# Patient Record
Sex: Male | Born: 1940 | Race: White | Hispanic: No | Smoking: Former smoker
Health system: Southern US, Community
[De-identification: ages and names within clinical notes are randomized; demographics above are authoritative.]

## PROBLEM LIST (undated history)

## (undated) DIAGNOSIS — E559 Vitamin D deficiency, unspecified: Secondary | ICD-10-CM

## (undated) DIAGNOSIS — I10 Essential (primary) hypertension: Secondary | ICD-10-CM

## (undated) DIAGNOSIS — M1990 Unspecified osteoarthritis, unspecified site: Secondary | ICD-10-CM

## (undated) DIAGNOSIS — N281 Cyst of kidney, acquired: Secondary | ICD-10-CM

## (undated) DIAGNOSIS — E785 Hyperlipidemia, unspecified: Secondary | ICD-10-CM

## (undated) DIAGNOSIS — I739 Peripheral vascular disease, unspecified: Secondary | ICD-10-CM

## (undated) HISTORY — DX: Cyst of kidney, acquired: N28.1

## (undated) HISTORY — PX: TONSILLECTOMY: SUR1361

## (undated) HISTORY — DX: Peripheral vascular disease, unspecified: I73.9

## (undated) HISTORY — DX: Unspecified osteoarthritis, unspecified site: M19.90

## (undated) HISTORY — DX: Hyperlipidemia, unspecified: E78.5

## (undated) HISTORY — DX: Vitamin D deficiency, unspecified: E55.9

## (undated) HISTORY — DX: Essential (primary) hypertension: I10

## (undated) HISTORY — PX: HERNIA REPAIR: SHX51

---

## 2005-03-06 ENCOUNTER — Emergency Department: Payer: Self-pay | Admitting: Emergency Medicine

## 2009-06-24 ENCOUNTER — Ambulatory Visit: Payer: Self-pay | Admitting: Cardiovascular Disease

## 2009-06-24 ENCOUNTER — Ambulatory Visit: Payer: Self-pay | Admitting: Surgery

## 2009-07-05 ENCOUNTER — Ambulatory Visit: Payer: Self-pay | Admitting: Surgery

## 2009-11-13 HISTORY — PX: OTHER SURGICAL HISTORY: SHX169

## 2010-06-20 ENCOUNTER — Inpatient Hospital Stay: Payer: Self-pay | Admitting: Surgery

## 2013-09-27 ENCOUNTER — Emergency Department: Payer: Self-pay | Admitting: Emergency Medicine

## 2013-09-27 LAB — URINALYSIS, COMPLETE
Bilirubin,UR: NEGATIVE
Blood: NEGATIVE
Leukocyte Esterase: NEGATIVE
Nitrite: NEGATIVE
Ph: 5 (ref 4.5–8.0)
Protein: 30
Specific Gravity: 1.021 (ref 1.003–1.030)
WBC UR: 1 /HPF (ref 0–5)

## 2013-09-27 LAB — CBC
HCT: 41.4 % (ref 40.0–52.0)
HGB: 14.5 g/dL (ref 13.0–18.0)
MCH: 29.8 pg (ref 26.0–34.0)
MCV: 85 fL (ref 80–100)
Platelet: 199 10*3/uL (ref 150–440)

## 2013-09-27 LAB — TROPONIN I: Troponin-I: <0.02 ng/mL

## 2013-09-27 LAB — COMPREHENSIVE METABOLIC PANEL
Alkaline Phosphatase: 64 U/L (ref 50–136)
Anion Gap: 8 (ref 7–16)
BUN: 30 mg/dL — ABNORMAL HIGH (ref 7–18)
Bilirubin,Total: 0.6 mg/dL (ref 0.2–1.0)
Calcium, Total: 10.6 mg/dL — ABNORMAL HIGH (ref 8.5–10.1)
Co2: 30 mmol/L (ref 21–32)
Creatinine: 1.43 mg/dL — ABNORMAL HIGH (ref 0.60–1.30)
EGFR (Non-African Amer.): 49 — ABNORMAL LOW
Glucose: 140 mg/dL — ABNORMAL HIGH (ref 65–99)
Osmolality: 279 (ref 275–301)
Potassium: 3.5 mmol/L (ref 3.5–5.1)
SGOT(AST): 40 U/L — ABNORMAL HIGH (ref 15–37)
SGPT (ALT): 69 U/L (ref 12–78)
Total Protein: 7.8 g/dL (ref 6.4–8.2)

## 2013-09-27 LAB — CK TOTAL AND CKMB (NOT AT ARMC): CK-MB: 7.2 ng/mL — ABNORMAL HIGH (ref 0.5–3.6)

## 2013-09-27 LAB — LIPASE, BLOOD: Lipase: 127 U/L (ref 73–393)

## 2013-12-15 DIAGNOSIS — N281 Cyst of kidney, acquired: Secondary | ICD-10-CM | POA: Insufficient documentation

## 2014-08-01 ENCOUNTER — Inpatient Hospital Stay: Payer: Self-pay | Admitting: Surgery

## 2014-08-01 LAB — URINALYSIS, COMPLETE
BILIRUBIN, UR: NEGATIVE
Bacteria: NONE SEEN
Blood: NEGATIVE
Glucose,UR: NEGATIVE mg/dL (ref 0–75)
KETONE: NEGATIVE
LEUKOCYTE ESTERASE: NEGATIVE
Nitrite: NEGATIVE
PH: 7 (ref 4.5–8.0)
RBC,UR: 3 /HPF (ref 0–5)
Specific Gravity: 1.017 (ref 1.003–1.030)
Squamous Epithelial: NONE SEEN
WBC UR: NONE SEEN /HPF (ref 0–5)

## 2014-08-01 LAB — COMPREHENSIVE METABOLIC PANEL
ALK PHOS: 47 U/L
AST: 49 U/L — AB (ref 15–37)
Albumin: 4.7 g/dL (ref 3.4–5.0)
Anion Gap: 9 (ref 7–16)
BILIRUBIN TOTAL: 0.4 mg/dL (ref 0.2–1.0)
BUN: 19 mg/dL — AB (ref 7–18)
Calcium, Total: 9.9 mg/dL (ref 8.5–10.1)
Chloride: 105 mmol/L (ref 98–107)
Co2: 26 mmol/L (ref 21–32)
Creatinine: 0.98 mg/dL (ref 0.60–1.30)
Glucose: 136 mg/dL — ABNORMAL HIGH (ref 65–99)
OSMOLALITY: 284 (ref 275–301)
POTASSIUM: 3.7 mmol/L (ref 3.5–5.1)
SGPT (ALT): 56 U/L
SODIUM: 140 mmol/L (ref 136–145)
TOTAL PROTEIN: 8.3 g/dL — AB (ref 6.4–8.2)

## 2014-08-01 LAB — CBC WITH DIFFERENTIAL/PLATELET
Basophil #: 0.1 10*3/uL (ref 0.0–0.1)
Basophil %: 0.9 %
Eosinophil #: 0.1 10*3/uL (ref 0.0–0.7)
Eosinophil %: 0.7 %
HCT: 44.7 % (ref 40.0–52.0)
HGB: 14.6 g/dL (ref 13.0–18.0)
LYMPHS ABS: 1.4 10*3/uL (ref 1.0–3.6)
Lymphocyte %: 13.9 %
MCH: 29.4 pg (ref 26.0–34.0)
MCHC: 32.7 g/dL (ref 32.0–36.0)
MCV: 90 fL (ref 80–100)
Monocyte #: 0.4 x10 3/mm (ref 0.2–1.0)
Monocyte %: 4.1 %
NEUTROS ABS: 8.1 10*3/uL — AB (ref 1.4–6.5)
Neutrophil %: 80.4 %
PLATELETS: 228 10*3/uL (ref 150–440)
RBC: 4.96 10*6/uL (ref 4.40–5.90)
RDW: 14.1 % (ref 11.5–14.5)
WBC: 10.1 10*3/uL (ref 3.8–10.6)

## 2014-08-01 LAB — TROPONIN I: Troponin-I: <0.02 ng/mL

## 2014-08-01 LAB — LIPASE, BLOOD: LIPASE: 822 U/L — AB (ref 73–393)

## 2014-08-02 LAB — CBC WITH DIFFERENTIAL/PLATELET
BASOS PCT: 0.3 %
Basophil #: 0 10*3/uL (ref 0.0–0.1)
EOS ABS: 0.1 10*3/uL (ref 0.0–0.7)
EOS PCT: 1.1 %
HCT: 39.8 % — AB (ref 40.0–52.0)
HGB: 13.7 g/dL (ref 13.0–18.0)
LYMPHS ABS: 1.8 10*3/uL (ref 1.0–3.6)
LYMPHS PCT: 18.3 %
MCH: 30.5 pg (ref 26.0–34.0)
MCHC: 34.3 g/dL (ref 32.0–36.0)
MCV: 89 fL (ref 80–100)
MONO ABS: 0.8 x10 3/mm (ref 0.2–1.0)
MONOS PCT: 7.7 %
NEUTROS ABS: 7.2 10*3/uL — AB (ref 1.4–6.5)
NEUTROS PCT: 72.6 %
Platelet: 206 10*3/uL (ref 150–440)
RBC: 4.47 10*6/uL (ref 4.40–5.90)
RDW: 14.2 % (ref 11.5–14.5)
WBC: 9.9 10*3/uL (ref 3.8–10.6)

## 2014-08-02 LAB — COMPREHENSIVE METABOLIC PANEL
ALBUMIN: 3.7 g/dL (ref 3.4–5.0)
ANION GAP: 7 (ref 7–16)
AST: 23 U/L (ref 15–37)
Alkaline Phosphatase: 40 U/L — ABNORMAL LOW
BILIRUBIN TOTAL: 0.4 mg/dL (ref 0.2–1.0)
BUN: 17 mg/dL (ref 7–18)
CALCIUM: 8.9 mg/dL (ref 8.5–10.1)
Chloride: 104 mmol/L (ref 98–107)
Co2: 29 mmol/L (ref 21–32)
Creatinine: 0.95 mg/dL (ref 0.60–1.30)
Glucose: 92 mg/dL (ref 65–99)
OSMOLALITY: 281 (ref 275–301)
POTASSIUM: 3.5 mmol/L (ref 3.5–5.1)
SGPT (ALT): 42 U/L
SODIUM: 140 mmol/L (ref 136–145)
Total Protein: 6.9 g/dL (ref 6.4–8.2)

## 2014-08-02 LAB — LIPASE, BLOOD: LIPASE: 110 U/L (ref 73–393)

## 2015-03-06 NOTE — H&P (Signed)
   Subjective/Chief Complaint Diffuse abdominal pain, nausea   History of Present Illness Frank Hamilton is a pleasant 74 yo m with a history of recurrent sbo, initially due to incarcerated/strangulated femoral hernia years ago who presents with approx 8 hours of diffuse abdominal pain.  Initially felt like he had some dyspepsia yesterday evening which resolved with peptobismol.  Felt strange this morning but tolerated breakfast and then had dyspepsia which did not resolve with pepto or gasx.  Developed severe recurrent abdominal pain at 9 am.  Better now.  CT shows mildly dilated proximal bowel loops.  Last bm x 2 this am which were small.   Past History H/o ex lap for sbo 11/14 H/o sbr for sbo in [place] secondary to femoral hernia H/o recurrent left femoral hernia repair 2010 H/o SBO which resolved spontaneously 2011 HTN H/o tonsillectomy H/o adenoidectomy   Past Medical Health Hypertension   Code Status Full Code   Past Med/Surgical Hx:  Small bowel obstruction: Surgery done at [facility]  HTN:   Hernia Repair:   Tonsillectomy:   ALLERGIES:  Sulfa drugs: Itching, Rash  Percocet: GI Distress  Vicodin: GI Distress  Family and Social History:  Family History Non-Contributory   Social History negative tobacco, negative ETOH   Place of Living Home   Review of Systems:  Subjective/Chief Complaint Diffuse abdominal pain, nausea   Fever/Chills No   Cough No   Sputum No   Abdominal Pain Yes   Diarrhea No   Constipation Yes   Nausea/Vomiting Yes   SOB/DOE No   Chest Pain No   Dysuria No   Tolerating Diet Yes  Nauseated   Physical Exam:  GEN well developed, well nourished, no acute distress   HEENT pink conjunctivae, PERRL, hearing intact to voice, good dentition   RESP normal resp effort  clear BS  no use of accessory muscles   CARD regular rate  no murmur  no thrills  no carotid bruits   ABD denies tenderness  no hernia  soft  distended  normal BS   Mild distention   EXTR negative cyanosis/clubbing, negative edema   SKIN normal to palpation, No rashes, No ulcers   NEURO cranial nerves intact, negative rigidity, negative tremor, follows commands, strength:, motor/sensory function intact   PSYCH A+O to time, place, person, good insight    Assessment/Admission Diagnosis 74 yo with history of recurrent sbo, now admit with dilated loops of proximal bowel, collapsed distal, abdominal pain.  Also with lipase 822 of unknown etiology.   Plan Admit, NG tube, hydrate.  KUB in am.  F/u am labs and lipase.  Treat as sbo.  no need for acute surgical intervention at this time.   Electronic Signatures: Floyde Parkins (MD)  (Signed 19-Sep-15 16:53)  Authored: CHIEF COMPLAINT and HISTORY, PAST MEDICAL/SURGIAL HISTORY, ALLERGIES, FAMILY AND SOCIAL HISTORY, REVIEW OF SYSTEMS, PHYSICAL EXAM, ASSESSMENT AND PLAN   Last Updated: 19-Sep-15 16:53 by Floyde Parkins (MD)

## 2015-08-03 ENCOUNTER — Other Ambulatory Visit: Payer: Self-pay | Admitting: Family Medicine

## 2015-08-03 ENCOUNTER — Encounter: Payer: Self-pay | Admitting: Family Medicine

## 2015-08-03 ENCOUNTER — Ambulatory Visit (INDEPENDENT_AMBULATORY_CARE_PROVIDER_SITE_OTHER): Payer: Medicare Other | Admitting: Family Medicine

## 2015-08-03 ENCOUNTER — Ambulatory Visit: Payer: Self-pay | Admitting: Family Medicine

## 2015-08-03 VITALS — BP 128/72 | HR 55 | Temp 98.1°F | Resp 16 | Ht 71.0 in | Wt 200.4 lb

## 2015-08-03 DIAGNOSIS — Z23 Encounter for immunization: Secondary | ICD-10-CM | POA: Diagnosis not present

## 2015-08-03 DIAGNOSIS — E785 Hyperlipidemia, unspecified: Secondary | ICD-10-CM | POA: Diagnosis not present

## 2015-08-03 DIAGNOSIS — I10 Essential (primary) hypertension: Secondary | ICD-10-CM | POA: Diagnosis not present

## 2015-08-03 MED ORDER — AMLODIPINE BESYLATE 10 MG PO TABS: 10.0000 mg | ORAL_TABLET | Freq: Every day | ORAL | Status: DC

## 2015-08-03 MED ORDER — BENAZEPRIL HCL 20 MG PO TABS: 20.0000 mg | ORAL_TABLET | Freq: Every day | ORAL | Status: DC

## 2015-08-03 MED ORDER — FENOFIBRATE MICRONIZED 134 MG PO CAPS: 134.0000 mg | ORAL_CAPSULE | Freq: Every day | ORAL | Status: DC

## 2015-08-03 NOTE — Progress Notes (Signed)
Name: Frank Hamilton   MRN: [id number]    DOB: September 03, 1941   Date:08/03/2015       Progress Note  Subjective  Chief Complaint  Chief Complaint  Patient presents with  . Hyperlipidemia    py here for 4 month follow up  . Hypertension    Hyperlipidemia This is a chronic problem. The problem is controlled. Pertinent negatives include no chest pain, leg pain, myalgias or shortness of breath. Current antihyperlipidemic treatment includes fibric acid derivatives.  Hypertension This is a chronic problem. The problem is controlled. Pertinent negatives include no chest pain, headaches, palpitations or shortness of breath. Past treatments include ACE inhibitors and calcium channel blockers. There is no history of kidney disease, CAD/MI or CVA.    Past Medical History  Diagnosis Date  . Hyperlipidemia   . Hypertension     History reviewed. No pertinent past surgical history.  Family History  Problem Relation Age of Onset  . Diabetes Maternal Grandfather     Social History   Social History  . Marital Status: Married    Spouse Name: N/A  . Number of Children: N/A  . Years of Education: N/A   Occupational History  . Not on file.   Social History Main Topics  . Smoking status: Former Research scientist (life sciences)  . Smokeless tobacco: Not on file  . Alcohol Use: 0.0 oz/week    0 Standard drinks or equivalent per week  . Drug Use: No  . Sexual Activity: Not on file   Other Topics Concern  . Not on file   Social History Narrative  . No narrative on file     Current outpatient prescriptions:  .  amLODipine (NORVASC) 10 MG tablet, Take 1 tablet (10 mg total) by mouth daily., Disp: 90 tablet, Rfl: 1 .  aspirin 81 MG chewable tablet, Chew 81 mg by mouth., Disp: , Rfl:  .  benazepril (LOTENSIN) 20 MG tablet, Take 1 tablet (20 mg total) by mouth daily., Disp: 90 tablet, Rfl: 1 .  fenofibrate micronized (LOFIBRA) 134 MG capsule, Take 1 capsule (134 mg total) by mouth daily., Disp: 90 capsule, Rfl:  1 .  latanoprost (XALATAN) 0.005 % ophthalmic solution, USE 1 DROP INTO BOTH EYES AT BEDTIME AS DIRECTED, Disp: , Rfl: 4 .  Omega-3 1000 MG CAPS, Take by mouth., Disp: , Rfl:   Allergies  Allergen Reactions  . Sulfa Antibiotics     Review of Systems  Respiratory: Negative for shortness of breath.   Cardiovascular: Negative for chest pain and palpitations.  Musculoskeletal: Negative for myalgias.  Neurological: Negative for headaches.    Objective  Filed Vitals:   08/03/15 1022  BP: 128/72  Pulse: 55  Temp: 98.1 F (36.7 C)  Resp: 16  Height: 5\' 11"  (1.803 m)  Weight: 200 lb 7 oz (90.918 kg)  SpO2: 95%    Physical Exam  Constitutional: He is well-developed, well-nourished, and in no distress.  Cardiovascular: Normal rate and regular rhythm.   Pulmonary/Chest: Effort normal and breath sounds normal.  Abdominal: Soft. Bowel sounds are normal.  Nursing note and vitals reviewed.  Assessment & Plan  1. Need for influenza vaccination  - Flu Vaccine QUAD 36+ mos PF IM (Fluarix & Fluzone Quad PF)  2. Essential hypertension  Blood pressure stable on present antihypertensive therapy.  3. Hyperlipidemia  Elevated total cholesterol, and LDL cholesterol by history. Patient does not wish to take statins. Continue on fenofibrate for now and recheck lipids today.  - Lipid Profile -  Comprehensive Metabolic Panel (CMET)    Syed Asad A. [facility] 08/03/2015 11:07 AM

## 2015-08-05 LAB — LIPID PANEL
CHOLESTEROL TOTAL: 197 mg/dL (ref 100–199)
Chol/HDL Ratio: 6.8 ratio units — ABNORMAL HIGH (ref 0.0–5.0)
HDL: 29 mg/dL — ABNORMAL LOW (ref >39)
LDL CALC: 135 mg/dL — AB (ref 0–99)
TRIGLYCERIDES: 167 mg/dL — AB (ref 0–149)
VLDL CHOLESTEROL CAL: 33 mg/dL (ref 5–40)

## 2015-08-05 LAB — COMPREHENSIVE METABOLIC PANEL
A/G RATIO: 2.1 (ref 1.1–2.5)
ALT: 40 IU/L (ref 0–44)
AST: 31 IU/L (ref 0–40)
Albumin: 4.6 g/dL (ref 3.5–4.8)
Alkaline Phosphatase: 42 IU/L (ref 39–117)
BILIRUBIN TOTAL: 0.4 mg/dL (ref 0.0–1.2)
BUN / CREAT RATIO: 21 (ref 10–22)
BUN: 17 mg/dL (ref 8–27)
CHLORIDE: 103 mmol/L (ref 97–108)
CO2: 24 mmol/L (ref 18–29)
Calcium: 9.5 mg/dL (ref 8.6–10.2)
Creatinine, Ser: 0.8 mg/dL (ref 0.76–1.27)
GFR calc non Af Amer: 89 mL/min/{1.73_m2} (ref >59)
GFR, EST AFRICAN AMERICAN: 103 mL/min/{1.73_m2} (ref >59)
Globulin, Total: 2.2 g/dL (ref 1.5–4.5)
Glucose: 94 mg/dL (ref 65–99)
POTASSIUM: 4.8 mmol/L (ref 3.5–5.2)
Sodium: 142 mmol/L (ref 134–144)
TOTAL PROTEIN: 6.8 g/dL (ref 6.0–8.5)

## 2015-08-24 ENCOUNTER — Telehealth: Payer: Self-pay | Admitting: Family Medicine

## 2015-08-24 NOTE — Telephone Encounter (Signed)
Routed note to front desk staff to schedule appointment

## 2015-08-25 NOTE — Telephone Encounter (Signed)
Appointment made for 09-07-15 with Dr Rutherford Nail

## 2015-09-07 ENCOUNTER — Encounter: Payer: Self-pay | Admitting: Family Medicine

## 2015-09-07 ENCOUNTER — Ambulatory Visit (INDEPENDENT_AMBULATORY_CARE_PROVIDER_SITE_OTHER): Payer: Medicare Other | Admitting: Family Medicine

## 2015-09-07 VITALS — BP 138/64 | HR 60 | Temp 98.5°F | Resp 16 | Ht 71.0 in | Wt 201.2 lb

## 2015-09-07 DIAGNOSIS — E786 Lipoprotein deficiency: Secondary | ICD-10-CM

## 2015-09-07 NOTE — Progress Notes (Signed)
Zoloft Name: Frank Hamilton   MRN: [id number]    DOB: [birth year]   Date:09/07/2015       Progress Note  Subjective  Chief Complaint  Chief Complaint  Patient presents with  . Hyperlipidemia    pt here to discuss therapy    HPI  Hyperlipidemia  Patient has a history of hyperlipidemia for over 5 years.  Current medical regimen consist of visual OTC fenofibrate .  Compliance is good .  Diet and exercise are currently followed fairly well .  Risk factors for cardiovascular disease include hyperlipidemia and hypertension and his age. He refuses to take statins   There have been no side effects from the medication.    Past Medical History  Diagnosis Date  . Hyperlipidemia   . Hypertension     Social History  Substance Use Topics  . Smoking status: Former Research scientist (life sciences)  . Smokeless tobacco: Not on file  . Alcohol Use: 0.0 oz/week    0 Standard drinks or equivalent per week     Current outpatient prescriptions:  .  amLODipine (NORVASC) 10 MG tablet, TAKE 1 TABLET BY MOUTH EVERY DAY, Disp: 90 tablet, Rfl: 1 .  amLODipine (NORVASC) 10 MG tablet, Take 1 tablet (10 mg total) by mouth daily., Disp: 90 tablet, Rfl: 1 .  aspirin 81 MG chewable tablet, Chew 81 mg by mouth., Disp: , Rfl:  .  benazepril (LOTENSIN) 20 MG tablet, TAKE ONE TABLET BY MOUTH ONCE DAILY, Disp: 90 tablet, Rfl: 1 .  benazepril (LOTENSIN) 20 MG tablet, Take 1 tablet (20 mg total) by mouth daily., Disp: 90 tablet, Rfl: 1 .  fenofibrate micronized (LOFIBRA) 134 MG capsule, TAKE ONE CAPSULE BY MOUTH EVERY DAY, Disp: 90 capsule, Rfl: 1 .  fenofibrate micronized (LOFIBRA) 134 MG capsule, Take 1 capsule (134 mg total) by mouth daily., Disp: 90 capsule, Rfl: 1 .  latanoprost (XALATAN) 0.005 % ophthalmic solution, USE 1 DROP INTO BOTH EYES AT BEDTIME AS DIRECTED, Disp: , Rfl: 4 .  Omega-3 1000 MG CAPS, Take by mouth., Disp: , Rfl:   Allergies  Allergen Reactions  . Sulfa Antibiotics     Review of Systems  Constitutional:  Negative for fever, chills and weight loss.  HENT: Negative for congestion, hearing loss, sore throat and tinnitus.   Eyes: Negative for blurred vision, double vision and redness.  Respiratory: Negative for cough, hemoptysis and shortness of breath.   Cardiovascular: Negative for chest pain, palpitations, orthopnea, claudication and leg swelling.  Gastrointestinal: Negative for heartburn, nausea, vomiting, diarrhea, constipation and blood in stool.  Genitourinary: Negative for dysuria, urgency, frequency and hematuria.  Musculoskeletal: Negative for myalgias, back pain, joint pain, falls and neck pain.  Skin: Negative for itching.  Neurological: Negative for dizziness, tingling, tremors, focal weakness, seizures, loss of consciousness, weakness and headaches.  Endo/Heme/Allergies: Does not bruise/bleed easily.  Psychiatric/Behavioral: Negative for depression and substance abuse. The patient is not nervous/anxious and does not have insomnia.      Objective  Filed Vitals:   09/07/15 1146  BP: 138/64  Pulse: 60  Temp: 98.5 F (36.9 C)  Resp: 16  Height: 5\' 11"  (1.803 m)  Weight: 201 lb 4 oz (91.286 kg)  SpO2: 95%     Physical Exam  Constitutional: He is well-developed, well-nourished, and in no distress.  HENT:  Head: Normocephalic.  Eyes: Pupils are equal, round, and reactive to light.      Assessment & Plan  1. HDL deficiency Discussed with patient stated difficulty of  increasing HDL. He is already on fish O fenofibrate. Informed him that niacin has not been found to decrease morbidity or mortality help increase HDL. He is encouraged to see dietitian well to avoid sweets and starches as much as possible and to increase his aerobic exercise. Reassessments      Skin

## 2015-11-14 HISTORY — PX: CATARACT EXTRACTION, BILATERAL: SHX1313

## 2015-11-16 ENCOUNTER — Telehealth: Payer: Self-pay | Admitting: Family Medicine

## 2015-11-16 NOTE — Telephone Encounter (Signed)
REFILL ON VALIUM . PHARM IS [facility] [address].

## 2015-11-16 NOTE — Telephone Encounter (Signed)
Left vm for pt to return my call to discuss this further.

## 2015-11-16 NOTE — Telephone Encounter (Signed)
I do not see workup and we have prescribed Valium for him

## 2015-11-18 ENCOUNTER — Other Ambulatory Visit: Payer: Self-pay | Admitting: Family Medicine

## 2015-11-18 MED ORDER — LORAZEPAM 0.5 MG PO TABS: 0.5000 mg | ORAL_TABLET | Freq: Two times a day (BID) | ORAL | Status: DC | PRN

## 2015-11-19 ENCOUNTER — Other Ambulatory Visit: Payer: Self-pay

## 2015-11-19 MED ORDER — CITALOPRAM HYDROBROMIDE 10 MG PO TABS: 10.0000 mg | ORAL_TABLET | Freq: Every day | ORAL | Status: DC

## 2016-01-10 DIAGNOSIS — H2511 Age-related nuclear cataract, right eye: Secondary | ICD-10-CM | POA: Diagnosis not present

## 2016-01-10 DIAGNOSIS — Z961 Presence of intraocular lens: Secondary | ICD-10-CM | POA: Diagnosis not present

## 2016-01-10 DIAGNOSIS — H25811 Combined forms of age-related cataract, right eye: Secondary | ICD-10-CM | POA: Diagnosis not present

## 2016-02-01 ENCOUNTER — Ambulatory Visit: Payer: Medicare Other | Admitting: Family Medicine

## 2016-02-25 ENCOUNTER — Other Ambulatory Visit: Payer: Self-pay | Admitting: Family Medicine

## 2016-02-29 ENCOUNTER — Ambulatory Visit: Payer: Medicare Other | Admitting: Family Medicine

## 2016-03-06 DIAGNOSIS — H2512 Age-related nuclear cataract, left eye: Secondary | ICD-10-CM | POA: Diagnosis not present

## 2016-03-06 DIAGNOSIS — H401222 Low-tension glaucoma, left eye, moderate stage: Secondary | ICD-10-CM | POA: Diagnosis not present

## 2016-03-06 DIAGNOSIS — H25812 Combined forms of age-related cataract, left eye: Secondary | ICD-10-CM | POA: Diagnosis not present

## 2016-03-06 DIAGNOSIS — H40002 Preglaucoma, unspecified, left eye: Secondary | ICD-10-CM | POA: Diagnosis not present

## 2016-03-15 ENCOUNTER — Telehealth: Payer: Self-pay | Admitting: Family Medicine

## 2016-03-15 NOTE — Telephone Encounter (Signed)
Requesting refill on Fenofibrate, Amlodipine, and Benazepril. Please send to [facility]

## 2016-03-16 MED ORDER — BENAZEPRIL HCL 20 MG PO TABS: 20.0000 mg | ORAL_TABLET | Freq: Every day | ORAL | Status: DC

## 2016-03-16 MED ORDER — AMLODIPINE BESYLATE 10 MG PO TABS: 10.0000 mg | ORAL_TABLET | Freq: Every day | ORAL | Status: DC

## 2016-03-16 MED ORDER — FENOFIBRATE MICRONIZED 134 MG PO CAPS: 134.0000 mg | ORAL_CAPSULE | Freq: Every day | ORAL | Status: DC

## 2016-03-16 NOTE — Telephone Encounter (Signed)
Left voice message informing patient prescriptions are at the pharmacy

## 2016-03-16 NOTE — Telephone Encounter (Signed)
Refills have been sent to pt pharmacy. 

## 2016-03-20 DIAGNOSIS — Z961 Presence of intraocular lens: Secondary | ICD-10-CM | POA: Diagnosis not present

## 2016-04-25 DIAGNOSIS — H40003 Preglaucoma, unspecified, bilateral: Secondary | ICD-10-CM | POA: Diagnosis not present

## 2016-05-01 ENCOUNTER — Encounter: Payer: Self-pay | Admitting: Family Medicine

## 2016-05-01 ENCOUNTER — Ambulatory Visit (INDEPENDENT_AMBULATORY_CARE_PROVIDER_SITE_OTHER): Payer: Medicare Other | Admitting: Family Medicine

## 2016-05-01 VITALS — BP 124/64 | HR 61 | Temp 98.9°F | Resp 18 | Ht 71.0 in | Wt 200.5 lb

## 2016-05-01 DIAGNOSIS — I10 Essential (primary) hypertension: Secondary | ICD-10-CM | POA: Diagnosis not present

## 2016-05-01 DIAGNOSIS — M19041 Primary osteoarthritis, right hand: Secondary | ICD-10-CM | POA: Insufficient documentation

## 2016-05-01 DIAGNOSIS — E559 Vitamin D deficiency, unspecified: Secondary | ICD-10-CM | POA: Diagnosis not present

## 2016-05-01 DIAGNOSIS — M150 Primary generalized (osteo)arthritis: Secondary | ICD-10-CM | POA: Diagnosis not present

## 2016-05-01 DIAGNOSIS — M8949 Other hypertrophic osteoarthropathy, multiple sites: Secondary | ICD-10-CM

## 2016-05-01 DIAGNOSIS — M79609 Pain in unspecified limb: Secondary | ICD-10-CM | POA: Diagnosis not present

## 2016-05-01 DIAGNOSIS — E785 Hyperlipidemia, unspecified: Secondary | ICD-10-CM

## 2016-05-01 DIAGNOSIS — M19042 Primary osteoarthritis, left hand: Secondary | ICD-10-CM

## 2016-05-01 DIAGNOSIS — M159 Polyosteoarthritis, unspecified: Secondary | ICD-10-CM

## 2016-05-01 DIAGNOSIS — M79605 Pain in left leg: Secondary | ICD-10-CM | POA: Diagnosis not present

## 2016-05-01 DIAGNOSIS — M79604 Pain in right leg: Secondary | ICD-10-CM

## 2016-05-01 LAB — COMPREHENSIVE METABOLIC PANEL
ALBUMIN: 4.4 g/dL (ref 3.6–5.1)
ALK PHOS: 45 U/L (ref 40–115)
ALT: 45 U/L (ref 9–46)
AST: 37 U/L — ABNORMAL HIGH (ref 10–35)
BUN: 12 mg/dL (ref 7–25)
CHLORIDE: 103 mmol/L (ref 98–110)
CO2: 28 mmol/L (ref 20–31)
CREATININE: 0.83 mg/dL (ref 0.70–1.18)
Calcium: 9.3 mg/dL (ref 8.6–10.3)
Glucose, Bld: 86 mg/dL (ref 65–99)
POTASSIUM: 4.7 mmol/L (ref 3.5–5.3)
SODIUM: 141 mmol/L (ref 135–146)
TOTAL PROTEIN: 6.6 g/dL (ref 6.1–8.1)
Total Bilirubin: 0.5 mg/dL (ref 0.2–1.2)

## 2016-05-01 LAB — CBC
HEMATOCRIT: 42.3 % (ref 38.5–50.0)
Hemoglobin: 14.3 g/dL (ref 13.2–17.1)
MCH: 29.9 pg (ref 27.0–33.0)
MCHC: 33.8 g/dL (ref 32.0–36.0)
MCV: 88.5 fL (ref 80.0–100.0)
MPV: 9.6 fL (ref 7.5–12.5)
Platelets: 192 10*3/uL (ref 140–400)
RBC: 4.78 MIL/uL (ref 4.20–5.80)
RDW: 14.6 % (ref 11.0–15.0)
WBC: 6.1 10*3/uL (ref 3.8–10.8)

## 2016-05-01 LAB — TSH: TSH: 3.42 mIU/L (ref 0.40–4.50)

## 2016-05-01 LAB — VITAMIN B12: VITAMIN B 12: 1264 pg/mL — AB (ref 200–1100)

## 2016-05-01 NOTE — Patient Instructions (Signed)
Stop magnesium and fenofibrate.

## 2016-05-02 ENCOUNTER — Encounter: Payer: Self-pay | Admitting: Family Medicine

## 2016-05-02 ENCOUNTER — Other Ambulatory Visit: Payer: Self-pay | Admitting: Family Medicine

## 2016-05-02 DIAGNOSIS — E559 Vitamin D deficiency, unspecified: Secondary | ICD-10-CM | POA: Insufficient documentation

## 2016-05-02 DIAGNOSIS — E538 Deficiency of other specified B group vitamins: Secondary | ICD-10-CM | POA: Insufficient documentation

## 2016-05-02 LAB — VITAMIN D 25 HYDROXY (VIT D DEFICIENCY, FRACTURES): VIT D 25 HYDROXY: 27 ng/mL — AB (ref 30–100)

## 2016-05-02 MED ORDER — VITAMIN D 50 MCG (2000 UT) PO CAPS: 1.0000 | ORAL_CAPSULE | Freq: Every day | ORAL | Status: AC

## 2016-05-02 NOTE — Progress Notes (Signed)
Date:  05/01/2016   Name:  Everlene FarrierDavid H Buechele   DOB:  08/10/1941   MRN:  [id number]  PCP:  Dennison MascotLemont Morrisey, MD    Chief Complaint: Hypertension; Hyperlipidemia; and Medication Refill   History of Present Illness:  This is a 75 y.o. male seen for eight month f/u. B12 def on supplement, HLD on Lofibra and omega-3, OA on G/C, HTN on Lotensin and amlodipine, on asa and Mg for prevention. C/o BLE pain/weakness with prolonged ambulation. Declines statin as family member had problems with it in past.  Review of Systems:  Review of Systems  Constitutional: Negative for fever and fatigue.  Respiratory: Negative for cough and shortness of breath.   Cardiovascular: Negative for chest pain and leg swelling.  Endocrine: Negative for polyuria.  Genitourinary: Negative for difficulty urinating.  Neurological: Negative for syncope and light-headedness.    Patient Active Problem List   Diagnosis Date Noted  . Osteoarthritis 05/01/2016  . Hypertension 08/03/2015  . Hyperlipidemia 08/03/2015  . Kidney cysts 12/15/2013    Prior to Admission medications   Medication Sig Start Date End Date Taking? Authorizing Provider  glucosamine-chondroitin 500-400 MG tablet Take 1 tablet by mouth 3 (three) times daily.   Yes Historical Provider, MD  vitamin B-12 (CYANOCOBALAMIN) 100 MCG tablet Take 100 mcg by mouth daily.   Yes Historical Provider, MD  amLODipine (NORVASC) 10 MG tablet Take 1 tablet (10 mg total) by mouth daily. 03/16/16   Dennison MascotLemont Morrisey, MD  aspirin 81 MG chewable tablet Chew 81 mg by mouth.    Historical Provider, MD  benazepril (LOTENSIN) 20 MG tablet Take 1 tablet (20 mg total) by mouth daily. 03/16/16   Dennison MascotLemont Morrisey, MD  Omega-3 1000 MG CAPS Take 1 capsule by mouth 2 (two) times daily.    Historical Provider, MD    Allergies  Allergen Reactions  . Sulfa Antibiotics     Past Surgical History  Procedure Laterality Date  . Hernia repair  2009 and 2010    Social History  Substance Use  Topics  . Smoking status: Former Games developermoker  . Smokeless tobacco: None  . Alcohol Use: 0.0 oz/week    0 Standard drinks or equivalent per week    Family History  Problem Relation Age of Onset  . Diabetes Maternal Grandfather   . Heart disease Mother   . Stroke Father   . Heart disease Sister   . Diabetes Sister     Medication list has been reviewed and updated.  Physical Examination: BP 124/64 mmHg  Pulse 61  Temp(Src) 98.9 F (37.2 C)  Resp 18  Ht 5\' 11"  (1.803 m)  Wt 200 lb 8 oz (90.946 kg)  BMI 27.98 kg/m2  SpO2 95%  Physical Exam  Constitutional: He appears well-developed and well-nourished.  Cardiovascular: Normal rate, regular rhythm and normal heart sounds.   Pedal pulses weak bilaterally, poor hair growth on feet  Pulmonary/Chest: Effort normal and breath sounds normal.  Musculoskeletal: He exhibits no edema.  Neurological: He is alert.  Skin: Skin is warm and dry.  Psychiatric: He has a normal mood and affect. His behavior is normal.  Nursing note and vitals reviewed.   Assessment and Plan:  1. Essential hypertension Well controlled on Lotensin/Norvasc - Comprehensive metabolic panel - CBC  2. Hyperlipidemia Marginal control in September, d/c [place] given no putcome benefit, advised statin but pt declines.  3. Leg pain, bilateral Possible claudication, consider BLE arterial Dopplers if blood work normal - TSH - B12 - Vitamin  D (25 hydroxy)  4. Primary osteoarthritis involving multiple joints Cont G/C  5. Med review D/c Mg as no clear indication for use  Return in about 6 months (around 10/31/2016).  Satira Anis. [facility]  05/02/2016

## 2016-06-07 DIAGNOSIS — H40003 Preglaucoma, unspecified, bilateral: Secondary | ICD-10-CM | POA: Diagnosis not present

## 2016-06-26 ENCOUNTER — Other Ambulatory Visit: Payer: Self-pay | Admitting: Family Medicine

## 2016-07-10 ENCOUNTER — Telehealth: Payer: Self-pay | Admitting: Family Medicine

## 2016-07-10 NOTE — Telephone Encounter (Signed)
Patient has not been seen in over 6 months. Please schedule for a medication refill appointment

## 2016-07-11 NOTE — Telephone Encounter (Signed)
Pt scheduled  

## 2016-07-11 NOTE — Telephone Encounter (Signed)
COMPLETED

## 2016-07-12 ENCOUNTER — Ambulatory Visit
Admission: RE | Admit: 2016-07-12 | Discharge: 2016-07-12 | Disposition: A | Discharge status: Home / Self Care | Payer: Medicare Other | Source: Ambulatory Visit | Attending: Family Medicine | Admitting: Family Medicine

## 2016-07-12 ENCOUNTER — Ambulatory Visit (INDEPENDENT_AMBULATORY_CARE_PROVIDER_SITE_OTHER): Payer: Medicare Other | Admitting: Family Medicine

## 2016-07-12 ENCOUNTER — Encounter: Payer: Self-pay | Admitting: Family Medicine

## 2016-07-12 VITALS — BP 148/72 | HR 60 | Temp 98.2°F | Resp 16 | Ht 71.0 in | Wt 200.0 lb

## 2016-07-12 DIAGNOSIS — M5136 Other intervertebral disc degeneration, lumbar region: Secondary | ICD-10-CM | POA: Insufficient documentation

## 2016-07-12 DIAGNOSIS — M25552 Pain in left hip: Secondary | ICD-10-CM

## 2016-07-12 DIAGNOSIS — I10 Essential (primary) hypertension: Secondary | ICD-10-CM

## 2016-07-12 DIAGNOSIS — M1612 Unilateral primary osteoarthritis, left hip: Secondary | ICD-10-CM | POA: Diagnosis not present

## 2016-07-12 DIAGNOSIS — M160 Bilateral primary osteoarthritis of hip: Secondary | ICD-10-CM | POA: Diagnosis not present

## 2016-07-12 DIAGNOSIS — E785 Hyperlipidemia, unspecified: Secondary | ICD-10-CM | POA: Diagnosis not present

## 2016-07-12 MED ORDER — AMLODIPINE BESYLATE 10 MG PO TABS: 10.0000 mg | ORAL_TABLET | Freq: Every day | ORAL | 0 refills | Status: DC

## 2016-07-12 MED ORDER — BENAZEPRIL HCL 20 MG PO TABS: 20.0000 mg | ORAL_TABLET | Freq: Every day | ORAL | 0 refills | Status: DC

## 2016-07-12 NOTE — Progress Notes (Signed)
Name: Frank Hamilton   MRN: [id number]    DOB: 1941/08/05   Date:07/12/2016       Progress Note  Subjective  Chief Complaint  Chief Complaint  Patient presents with  . Medication Refill    Back Pain Pertinent negatives include no chest pain, headaches, numbness or tingling.  Hip Pain   There was no injury mechanism. The pain is present in the left hip. The pain is at a severity of 7/10. The pain is moderate. Pertinent negatives include no numbness or tingling. Associated symptoms comments: Associated mild ache in the left posterior leg.Marland Kitchen He has tried rest and NSAIDs (Lying down in the recliner makes his pain go away) for the symptoms.  Hypertension  This is a chronic problem. The problem is unchanged. The problem is controlled. Pertinent negatives include no blurred vision, chest pain, headaches, palpitations or shortness of breath. Past treatments include calcium channel blockers and ACE inhibitors. There is no history of kidney disease, CAD/MI or CVA.     Past Medical History:  Diagnosis Date  . Hyperlipidemia   . Hypertension     Past Surgical History:  Procedure Laterality Date  . HERNIA REPAIR  2009 and 2010    Family History  Problem Relation Age of Onset  . Heart disease Mother   . Stroke Father   . Heart disease Sister   . Diabetes Sister   . Diabetes Maternal Grandfather     Social History   Social History  . Marital status: Married    Spouse name: N/A  . Number of children: N/A  . Years of education: N/A   Occupational History  . Not on file.   Social History Main Topics  . Smoking status: Former Research scientist (life sciences)  . Smokeless tobacco: Never Used  . Alcohol use 0.0 oz/week  . Drug use: No  . Sexual activity: Not on file   Other Topics Concern  . Not on file   Social History Narrative  . No narrative on file     Current Outpatient Prescriptions:  .  amLODipine (NORVASC) 10 MG tablet, Take 1 tablet (10 mg total) by mouth daily., Disp: 90 tablet, Rfl:  0 .  aspirin 81 MG chewable tablet, Chew 81 mg by mouth., Disp: , Rfl:  .  benazepril (LOTENSIN) 20 MG tablet, Take 1 tablet (20 mg total) by mouth daily., Disp: 90 tablet, Rfl: 0 .  Cholecalciferol (VITAMIN D) 2000 units CAPS, Take 1 capsule (2,000 Units total) by mouth daily., Disp: 30 capsule, Rfl:  .  glucosamine-chondroitin 500-400 MG tablet, Take 1 tablet by mouth 3 (three) times daily., Disp: , Rfl:  .  Omega-3 1000 MG CAPS, Take 1 capsule by mouth 2 (two) times daily., Disp: , Rfl:   Allergies  Allergen Reactions  . Sulfa Antibiotics      Review of Systems  Eyes: Negative for blurred vision.  Respiratory: Negative for shortness of breath.   Cardiovascular: Negative for chest pain and palpitations.  Musculoskeletal: Positive for joint pain. Negative for back pain.  Neurological: Negative for tingling, numbness and headaches.    Objective  Vitals:   07/12/16 1039  BP: (!) 148/72  Pulse: 60  Resp: 16  Temp: 98.2 F (36.8 C)  TempSrc: Oral  SpO2: 96%  Weight: 200 lb (90.7 kg)  Height: 5\' 11"  (1.803 m)    Physical Exam  Constitutional: He is well-developed, well-nourished, and in no distress.  Cardiovascular: Normal rate, regular rhythm, S1 normal and S2 normal.  Murmur heard.  Systolic murmur is present with a grade of 2/6  Pulmonary/Chest: Effort normal and breath sounds normal. He has no wheezes.  Abdominal: Soft. Bowel sounds are normal. There is no tenderness.  Musculoskeletal:       Left hip: He exhibits normal range of motion, no tenderness and no swelling.       Right ankle: He exhibits no swelling.       Left ankle: He exhibits no swelling.  Side of the pain is in the left lateral hip, no tenderness to palpation at the affected area, normal range of motion of the left hip  Nursing note and vitals reviewed.     Assessment & Plan  1. Essential hypertension BP elevated because patient has not taken antihypertensives for 2 days as he ran out. Refills  provided - amLODipine (NORVASC) 10 MG tablet; Take 1 tablet (10 mg total) by mouth daily.  Dispense: 90 tablet; Refill: 0 - benazepril (LOTENSIN) 20 MG tablet; Take 1 tablet (20 mg total) by mouth daily.  Dispense: 90 tablet; Refill: 0  2. Hyperlipidemia FLP from September 2016 showed elevated triglycerides and LDL cholesterol repeat today, consider starting on statin therapy.  - Lipid Profile - COMPLETE METABOLIC PANEL WITH GFR  3. Acute pain of left hip Pain is responsive to NSAID therapy, likely osteoarthritis versus bursitis. Obtain x-ray of left hip - DG HIP UNILAT WITH PELVIS 2-3 VIEWS LEFT; Future   Ascencion Coye Asad A. [facility] 07/12/2016 11:07 AM

## 2016-07-13 LAB — LIPID PANEL
CHOL/HDL RATIO: 6.9 ratio — AB (ref <=5.0)
CHOLESTEROL: 192 mg/dL (ref 125–200)
HDL: 28 mg/dL — ABNORMAL LOW (ref >=40)
LDL CALC: 109 mg/dL (ref <130)
Triglycerides: 273 mg/dL — ABNORMAL HIGH (ref <150)
VLDL: 55 mg/dL — AB (ref <30)

## 2016-07-13 LAB — COMPLETE METABOLIC PANEL WITH GFR
ALT: 34 U/L (ref 9–46)
AST: 33 U/L (ref 10–35)
Albumin: 4.2 g/dL (ref 3.6–5.1)
Alkaline Phosphatase: 46 U/L (ref 40–115)
BUN: 17 mg/dL (ref 7–25)
CALCIUM: 9.5 mg/dL (ref 8.6–10.3)
CHLORIDE: 105 mmol/L (ref 98–110)
CO2: 27 mmol/L (ref 20–31)
CREATININE: 1.01 mg/dL (ref 0.70–1.18)
GFR, Est African American: 85 mL/min (ref >=60)
GFR, Est Non African American: 73 mL/min (ref >=60)
Glucose, Bld: 98 mg/dL (ref 65–99)
POTASSIUM: 5 mmol/L (ref 3.5–5.3)
SODIUM: 142 mmol/L (ref 135–146)
Total Bilirubin: 0.4 mg/dL (ref 0.2–1.2)
Total Protein: 6.7 g/dL (ref 6.1–8.1)

## 2016-08-07 ENCOUNTER — Encounter: Payer: Self-pay | Admitting: Family Medicine

## 2016-08-07 ENCOUNTER — Ambulatory Visit (INDEPENDENT_AMBULATORY_CARE_PROVIDER_SITE_OTHER): Payer: Medicare Other | Admitting: Family Medicine

## 2016-08-07 DIAGNOSIS — J0100 Acute maxillary sinusitis, unspecified: Secondary | ICD-10-CM

## 2016-08-07 MED ORDER — AZITHROMYCIN 250 MG PO TABS: ORAL_TABLET | ORAL | 0 refills | Status: DC

## 2016-08-07 NOTE — Progress Notes (Signed)
Name: Frank Hamilton   MRN: [id number]    DOB: 1941/04/13   Date:08/07/2016       Progress Note  Subjective  Chief Complaint  Chief Complaint  Patient presents with  . Acute Visit    fever, nausea  . Headache    Sinusitis  This is a new problem. The current episode started 1 to 4 weeks ago (3 weeks ago). The maximum temperature recorded prior to his arrival was 101 - 101.9 F. Associated symptoms include congestion and headaches. Pertinent negatives include no coughing, sinus pressure, sneezing or sore throat. Treatments tried: Pseudophed and Claritin. The treatment provided mild relief.    Past Medical History:  Diagnosis Date  . Hyperlipidemia   . Hypertension     Past Surgical History:  Procedure Laterality Date  . HERNIA REPAIR  2009 and 2010    Family History  Problem Relation Age of Onset  . Heart disease Mother   . Stroke Father   . Heart disease Sister   . Diabetes Sister   . Diabetes Maternal Grandfather     Social History   Social History  . Marital status: Married    Spouse name: N/A  . Number of children: N/A  . Years of education: N/A   Occupational History  . Not on file.   Social History Main Topics  . Smoking status: Former Research scientist (life sciences)  . Smokeless tobacco: Never Used  . Alcohol use 0.0 oz/week  . Drug use: No  . Sexual activity: Not on file   Other Topics Concern  . Not on file   Social History Narrative  . No narrative on file     Current Outpatient Prescriptions:  .  amLODipine (NORVASC) 10 MG tablet, Take 1 tablet (10 mg total) by mouth daily., Disp: 90 tablet, Rfl: 0 .  aspirin 81 MG chewable tablet, Chew 81 mg by mouth., Disp: , Rfl:  .  benazepril (LOTENSIN) 20 MG tablet, Take 1 tablet (20 mg total) by mouth daily., Disp: 90 tablet, Rfl: 0 .  Cholecalciferol (VITAMIN D) 2000 units CAPS, Take 1 capsule (2,000 Units total) by mouth daily., Disp: 30 capsule, Rfl:  .  glucosamine-chondroitin 500-400 MG tablet, Take 1 tablet by mouth 3  (three) times daily., Disp: , Rfl:  .  Omega-3 1000 MG CAPS, Take 1 capsule by mouth 2 (two) times daily., Disp: , Rfl:   Allergies  Allergen Reactions  . Sulfa Antibiotics      Review of Systems  Constitutional: Positive for fever.  HENT: Positive for congestion. Negative for sinus pressure, sneezing and sore throat.   Respiratory: Negative for cough and hemoptysis.   Cardiovascular: Negative for chest pain and palpitations.  Neurological: Positive for headaches.    Objective  Vitals:   08/07/16 1355  BP: 137/73  Pulse: 81  Resp: 16  Temp: (!) 102.2 F (39 C)  TempSrc: Oral  SpO2: 95%  Weight: 197 lb 11.2 oz (89.7 kg)  Height: 5\' 11"  (1.803 m)    Physical Exam  Constitutional: He is well-developed, well-nourished, and in no distress.  HENT:  Head: Atraumatic.  Right Ear: Tympanic membrane and ear canal normal.  Left Ear: Tympanic membrane and ear canal normal.  Nose: Right sinus exhibits maxillary sinus tenderness. Left sinus exhibits maxillary sinus tenderness.  Mouth/Throat: No posterior oropharyngeal erythema.  Nasal turbinates hypertrophied, mucosal inflammation.   Cardiovascular: Regular rhythm, S1 normal and S2 normal.  Tachycardia present.   Murmur heard.  Systolic murmur is present with a  grade of 2/6  Pulmonary/Chest: Effort normal and breath sounds normal. He has no wheezes.  Nursing note and vitals reviewed.    Assessment & Plan  1. Acute non-recurrent maxillary sinusitis Started on azithromycin because of duration of symptoms. - azithromycin (ZITHROMAX) 250 MG tablet; 2 tabs po day 1, then 1 tab po q day x 4 days  Dispense: 6 each; Refill: 0   Tayler Lassen Asad A. [facility] 08/07/2016 2:24 PM

## 2016-09-25 ENCOUNTER — Ambulatory Visit
Admission: RE | Admit: 2016-09-25 | Discharge: 2016-09-25 | Disposition: A | Discharge status: Home / Self Care | Payer: Medicare Other | Source: Ambulatory Visit | Attending: Family Medicine | Admitting: Family Medicine

## 2016-09-25 ENCOUNTER — Ambulatory Visit (INDEPENDENT_AMBULATORY_CARE_PROVIDER_SITE_OTHER): Payer: Medicare Other | Admitting: Family Medicine

## 2016-09-25 ENCOUNTER — Ambulatory Visit
Admission: AD | Admit: 2016-09-25 | Discharge: 2016-09-25 | Disposition: A | Discharge status: Home / Self Care | Payer: Medicare Other | Source: Ambulatory Visit | Attending: Family Medicine | Admitting: Family Medicine

## 2016-09-25 ENCOUNTER — Encounter: Payer: Self-pay | Admitting: Family Medicine

## 2016-09-25 VITALS — BP 128/67 | HR 92 | Temp 103.1°F | Resp 18 | Ht 71.0 in | Wt 199.6 lb

## 2016-09-25 DIAGNOSIS — R05 Cough: Secondary | ICD-10-CM

## 2016-09-25 DIAGNOSIS — R6889 Other general symptoms and signs: Secondary | ICD-10-CM | POA: Diagnosis not present

## 2016-09-25 DIAGNOSIS — R058 Other specified cough: Secondary | ICD-10-CM

## 2016-09-25 DIAGNOSIS — R0602 Shortness of breath: Secondary | ICD-10-CM | POA: Diagnosis not present

## 2016-09-25 LAB — POCT INFLUENZA A/B
INFLUENZA B, POC: NEGATIVE
Influenza A, POC: NEGATIVE

## 2016-09-25 MED ORDER — AZITHROMYCIN 250 MG PO TABS: ORAL_TABLET | ORAL | 0 refills | Status: DC

## 2016-09-25 NOTE — Progress Notes (Signed)
Name: Frank Hamilton   MRN: [id number]    DOB: 07-23-1941   Date:09/25/2016       Progress Note  Subjective  Chief Complaint  Chief Complaint  Patient presents with  . Acute Visit    patient has fever of 103.1, fatigue, and headache     Fever   This is a new problem. The current episode started in the past 7 days. The maximum temperature noted was 103 to 103.9 F. The temperature was taken using an oral thermometer. Associated symptoms include coughing, headaches and muscle aches. Pertinent negatives include no abdominal pain, chest pain, congestion, diarrhea, ear pain, nausea (some nausea symptoms with loss of appetite), rash, sore throat, urinary pain, vomiting or wheezing. He has tried NSAIDs (Naproxen) for the symptoms. The treatment provided significant relief.     Past Medical History:  Diagnosis Date  . Hyperlipidemia   . Hypertension     Past Surgical History:  Procedure Laterality Date  . HERNIA REPAIR  2009 and 2010    Family History  Problem Relation Age of Onset  . Heart disease Mother   . Stroke Father   . Heart disease Sister   . Diabetes Sister   . Diabetes Maternal Grandfather     Social History   Social History  . Marital status: Married    Spouse name: N/A  . Number of children: N/A  . Years of education: N/A   Occupational History  . Not on file.   Social History Main Topics  . Smoking status: Former Research scientist (life sciences)  . Smokeless tobacco: Never Used  . Alcohol use 0.0 oz/week  . Drug use: No  . Sexual activity: Not on file   Other Topics Concern  . Not on file   Social History Narrative  . No narrative on file     Current Outpatient Prescriptions:  .  amLODipine (NORVASC) 10 MG tablet, Take 1 tablet (10 mg total) by mouth daily., Disp: 90 tablet, Rfl: 0 .  aspirin 81 MG chewable tablet, Chew 81 mg by mouth., Disp: , Rfl:  .  benazepril (LOTENSIN) 20 MG tablet, Take 1 tablet (20 mg total) by mouth daily., Disp: 90 tablet, Rfl: 0 .   Cholecalciferol (VITAMIN D) 2000 units CAPS, Take 1 capsule (2,000 Units total) by mouth daily., Disp: 30 capsule, Rfl:  .  glucosamine-chondroitin 500-400 MG tablet, Take 1 tablet by mouth 3 (three) times daily., Disp: , Rfl:  .  Omega-3 1000 MG CAPS, Take 1 capsule by mouth 2 (two) times daily., Disp: , Rfl:  .  azithromycin (ZITHROMAX) 250 MG tablet, 2 tabs po day 1, then 1 tab po q day x 4 days (Patient not taking: Reported on 09/25/2016), Disp: 6 each, Rfl: 0  Allergies  Allergen Reactions  . Sulfa Antibiotics      Review of Systems  Constitutional: Positive for fever.  HENT: Negative for congestion, ear discharge, ear pain and sore throat.   Eyes: Negative for blurred vision and double vision.  Respiratory: Positive for cough. Negative for sputum production, shortness of breath and wheezing.   Cardiovascular: Negative for chest pain.  Gastrointestinal: Negative for abdominal pain, constipation, diarrhea, nausea (some nausea symptoms with loss of appetite) and vomiting.  Genitourinary: Negative for dysuria, frequency, hematuria and urgency.  Skin: Negative for rash.  Neurological: Positive for headaches.    Objective  Vitals:   09/25/16 1600  BP: 128/67  Pulse: 92  Resp: 18  Temp: (!) 103.1 F (39.5 C)  TempSrc:  Oral  SpO2: 93%  Weight: 199 lb 9.6 oz (90.5 kg)  Height: 5\' 11"  (1.803 m)    Physical Exam  Constitutional: He is well-developed, well-nourished, and in no distress.  HENT:  Right Ear: Tympanic membrane and ear canal normal. No drainage, swelling or tenderness.  Left Ear: Tympanic membrane and ear canal normal. There is tenderness. No drainage or swelling.  Neck: Neck supple.  Cardiovascular: Normal rate, regular rhythm, S1 normal, S2 normal and normal heart sounds.   No murmur heard. Pulmonary/Chest: Effort normal. No respiratory distress. He has no decreased breath sounds. He has no wheezes. He has rales in the right lower field.  Abdominal: Soft. Bowel  sounds are normal. There is no tenderness.  Nursing note and vitals reviewed.    Assessment & Plan  1. Flu-like symptoms Point-of-care flu testing was negative - POCT Influenza A/B  2. Dry cough Obtained chest x-ray which was unremarkable which rules out pneumonia, flu was negative. We will start on Azithromycin 250 mg for 5 days - DG Chest 2 View; Future - azithromycin (ZITHROMAX) 250 MG tablet; 2 tabs po day 1, then1 tab po q day x 4 days  Dispense: 6 tablet; Refill: 0  Makylie Rivere Asad [facility] 09/25/2016 4:32 PM

## 2016-10-09 ENCOUNTER — Other Ambulatory Visit: Payer: Self-pay | Admitting: Family Medicine

## 2016-10-09 DIAGNOSIS — I10 Essential (primary) hypertension: Secondary | ICD-10-CM

## 2016-10-31 ENCOUNTER — Ambulatory Visit: Payer: Medicare Other | Admitting: Family Medicine

## 2016-11-15 ENCOUNTER — Ambulatory Visit (INDEPENDENT_AMBULATORY_CARE_PROVIDER_SITE_OTHER): Payer: Medicare Other | Admitting: Family Medicine

## 2016-11-15 ENCOUNTER — Encounter: Payer: Self-pay | Admitting: Family Medicine

## 2016-11-15 VITALS — BP 127/64 | HR 68 | Temp 97.9°F | Resp 15 | Ht 71.0 in | Wt 196.4 lb

## 2016-11-15 DIAGNOSIS — E785 Hyperlipidemia, unspecified: Secondary | ICD-10-CM | POA: Diagnosis not present

## 2016-11-15 DIAGNOSIS — E559 Vitamin D deficiency, unspecified: Secondary | ICD-10-CM | POA: Diagnosis not present

## 2016-11-15 DIAGNOSIS — I10 Essential (primary) hypertension: Secondary | ICD-10-CM

## 2016-11-15 DIAGNOSIS — Z23 Encounter for immunization: Secondary | ICD-10-CM

## 2016-11-15 MED ORDER — AMLODIPINE BESYLATE 10 MG PO TABS: 10.0000 mg | ORAL_TABLET | Freq: Every day | ORAL | 0 refills | Status: DC

## 2016-11-15 MED ORDER — BENAZEPRIL HCL 20 MG PO TABS: 20.0000 mg | ORAL_TABLET | Freq: Every day | ORAL | 0 refills | Status: DC

## 2016-11-15 NOTE — Progress Notes (Signed)
Name: Frank Hamilton   MRN: [id number]    DOB: [birth year]   Date:11/15/2016       Progress Note  Subjective  Chief Complaint  Chief Complaint  Patient presents with  . Follow-up    4 mo    Hypertension  This is a chronic problem. The problem is unchanged. Pertinent negatives include no blurred vision, chest pain, headaches, orthopnea, palpitations or shortness of breath. Past treatments include ACE inhibitors and calcium channel blockers. There is no history of kidney disease, CAD/MI or CVA.  Hyperlipidemia  This is a chronic problem. The problem is uncontrolled. Recent lipid tests were reviewed and are high. Pertinent negatives include no chest pain, leg pain, myalgias or shortness of breath. Current antihyperlipidemic treatment includes statins. Risk factors for coronary artery disease include dyslipidemia.     Past Medical History:  Diagnosis Date  . Hyperlipidemia   . Hypertension     Past Surgical History:  Procedure Laterality Date  . HERNIA REPAIR  2009 and 2010    Family History  Problem Relation Age of Onset  . Heart disease Mother   . Stroke Father   . Heart disease Sister   . Diabetes Sister   . Diabetes Maternal Grandfather     Social History   Social History  . Marital status: Married    Spouse name: N/A  . Number of children: N/A  . Years of education: N/A   Occupational History  . Not on file.   Social History Main Topics  . Smoking status: Former Research scientist (life sciences)  . Smokeless tobacco: Never Used  . Alcohol use 0.0 oz/week  . Drug use: No  . Sexual activity: Not on file   Other Topics Concern  . Not on file   Social History Narrative  . No narrative on file     Current Outpatient Prescriptions:  .  amLODipine (NORVASC) 10 MG tablet, TAKE ONE TABLET BY MOUTH ONCE DAILY, Disp: 90 tablet, Rfl: 0 .  aspirin 81 MG chewable tablet, Chew 81 mg by mouth., Disp: , Rfl:  .  benazepril (LOTENSIN) 20 MG tablet, TAKE ONE TABLET BY MOUTH ONCE DAILY, Disp: 90  tablet, Rfl: 0 .  Cholecalciferol (VITAMIN D) 2000 units CAPS, Take 1 capsule (2,000 Units total) by mouth daily., Disp: 30 capsule, Rfl:  .  glucosamine-chondroitin 500-400 MG tablet, Take 1 tablet by mouth 3 (three) times daily., Disp: , Rfl:  .  Omega-3 1000 MG CAPS, Take 1 capsule by mouth 2 (two) times daily., Disp: , Rfl:  .  azithromycin (ZITHROMAX) 250 MG tablet, 2 tabs po day 1, then1 tab po q day x 4 days (Patient not taking: Reported on 11/15/2016), Disp: 6 tablet, Rfl: 0  Allergies  Allergen Reactions  . Sulfa Antibiotics      Review of Systems  Eyes: Negative for blurred vision.  Respiratory: Negative for shortness of breath.   Cardiovascular: Negative for chest pain, palpitations and orthopnea.  Musculoskeletal: Negative for myalgias.  Neurological: Negative for headaches.     Objective  Vitals:   11/15/16 1458  BP: 127/64  Pulse: 68  Resp: 15  Temp: 97.9 F (36.6 C)  TempSrc: Oral  SpO2: 96%  Weight: 196 lb 6.4 oz (89.1 kg)  Height: 5\' 11"  (1.803 m)    Physical Exam  Constitutional: He is well-developed, well-nourished, and in no distress.  Cardiovascular: Normal rate, regular rhythm, S1 normal and S2 normal.   Murmur heard. Pulmonary/Chest: Effort normal and breath sounds normal. He has  no wheezes. He has no rhonchi.  Abdominal: Soft. Normal aorta and bowel sounds are normal. There is no tenderness.  Musculoskeletal:       Right ankle: He exhibits no swelling.       Left ankle: He exhibits no swelling.  Psychiatric: Mood, memory, affect and judgment normal.  Nursing note and vitals reviewed.    Assessment & Plan  1. Need for immunization against influenza  - Flu vaccine HIGH DOSE PF (Fluzone High dose)  2. Essential hypertension  - benazepril (LOTENSIN) 20 MG tablet; Take 1 tablet (20 mg total) by mouth daily.  Dispense: 90 tablet; Refill: 0 - amLODipine (NORVASC) 10 MG tablet; Take 1 tablet (10 mg total) by mouth daily.  Dispense: 90 tablet;  Refill: 0  3. Hyperlipidemia, unspecified hyperlipidemia type  - Lipid Profile - COMPLETE METABOLIC PANEL WITH GFR  4. Vitamin D deficiency  - Vitamin D (25 hydroxy)   Ivi Griffith [facility]ad [facility] 11/15/2016 3:48 PM

## 2016-11-20 DIAGNOSIS — E785 Hyperlipidemia, unspecified: Secondary | ICD-10-CM | POA: Diagnosis not present

## 2016-11-20 DIAGNOSIS — E559 Vitamin D deficiency, unspecified: Secondary | ICD-10-CM | POA: Diagnosis not present

## 2016-11-21 LAB — COMPLETE METABOLIC PANEL WITH GFR
ALK PHOS: 44 U/L (ref 40–115)
ALT: 36 U/L (ref 9–46)
AST: 33 U/L (ref 10–35)
Albumin: 4.2 g/dL (ref 3.6–5.1)
BILIRUBIN TOTAL: 0.6 mg/dL (ref 0.2–1.2)
BUN: 15 mg/dL (ref 7–25)
CALCIUM: 9.3 mg/dL (ref 8.6–10.3)
CO2: 27 mmol/L (ref 20–31)
Chloride: 106 mmol/L (ref 98–110)
Creat: 0.74 mg/dL (ref 0.70–1.18)
GFR, Est African American: >89 mL/min (ref >=60)
GLUCOSE: 88 mg/dL (ref 65–99)
POTASSIUM: 4.6 mmol/L (ref 3.5–5.3)
SODIUM: 141 mmol/L (ref 135–146)
TOTAL PROTEIN: 6.6 g/dL (ref 6.1–8.1)

## 2016-11-21 LAB — LIPID PANEL
CHOL/HDL RATIO: 7.1 ratio — AB (ref <5.0)
CHOLESTEROL: 184 mg/dL (ref <200)
HDL: 26 mg/dL — AB (ref >40)
LDL Cholesterol: 110 mg/dL — ABNORMAL HIGH (ref <100)
TRIGLYCERIDES: 242 mg/dL — AB (ref <150)
VLDL: 48 mg/dL — AB (ref <30)

## 2016-11-21 LAB — VITAMIN D 25 HYDROXY (VIT D DEFICIENCY, FRACTURES): Vit D, 25-Hydroxy: 41 ng/mL (ref 30–100)

## 2016-12-12 DIAGNOSIS — H40003 Preglaucoma, unspecified, bilateral: Secondary | ICD-10-CM | POA: Diagnosis not present

## 2016-12-23 IMAGING — CR DG HIP (WITH OR WITHOUT PELVIS) 2-3V*L*
1 series · 3 of 3 positions shown · non-contrast
Comparison: 08/02/2014.

CLINICAL DATA: No known injury.  Hip joint pain.

EXAM:
DG HIP (WITH OR WITHOUT PELVIS) 2-3V LEFT

[Series 1: dg hip unilat w or w/o pelvis 2-3 views  · non-contrast · 0.14mm/px · 3 of 3 slices shown]
[im 1/3]
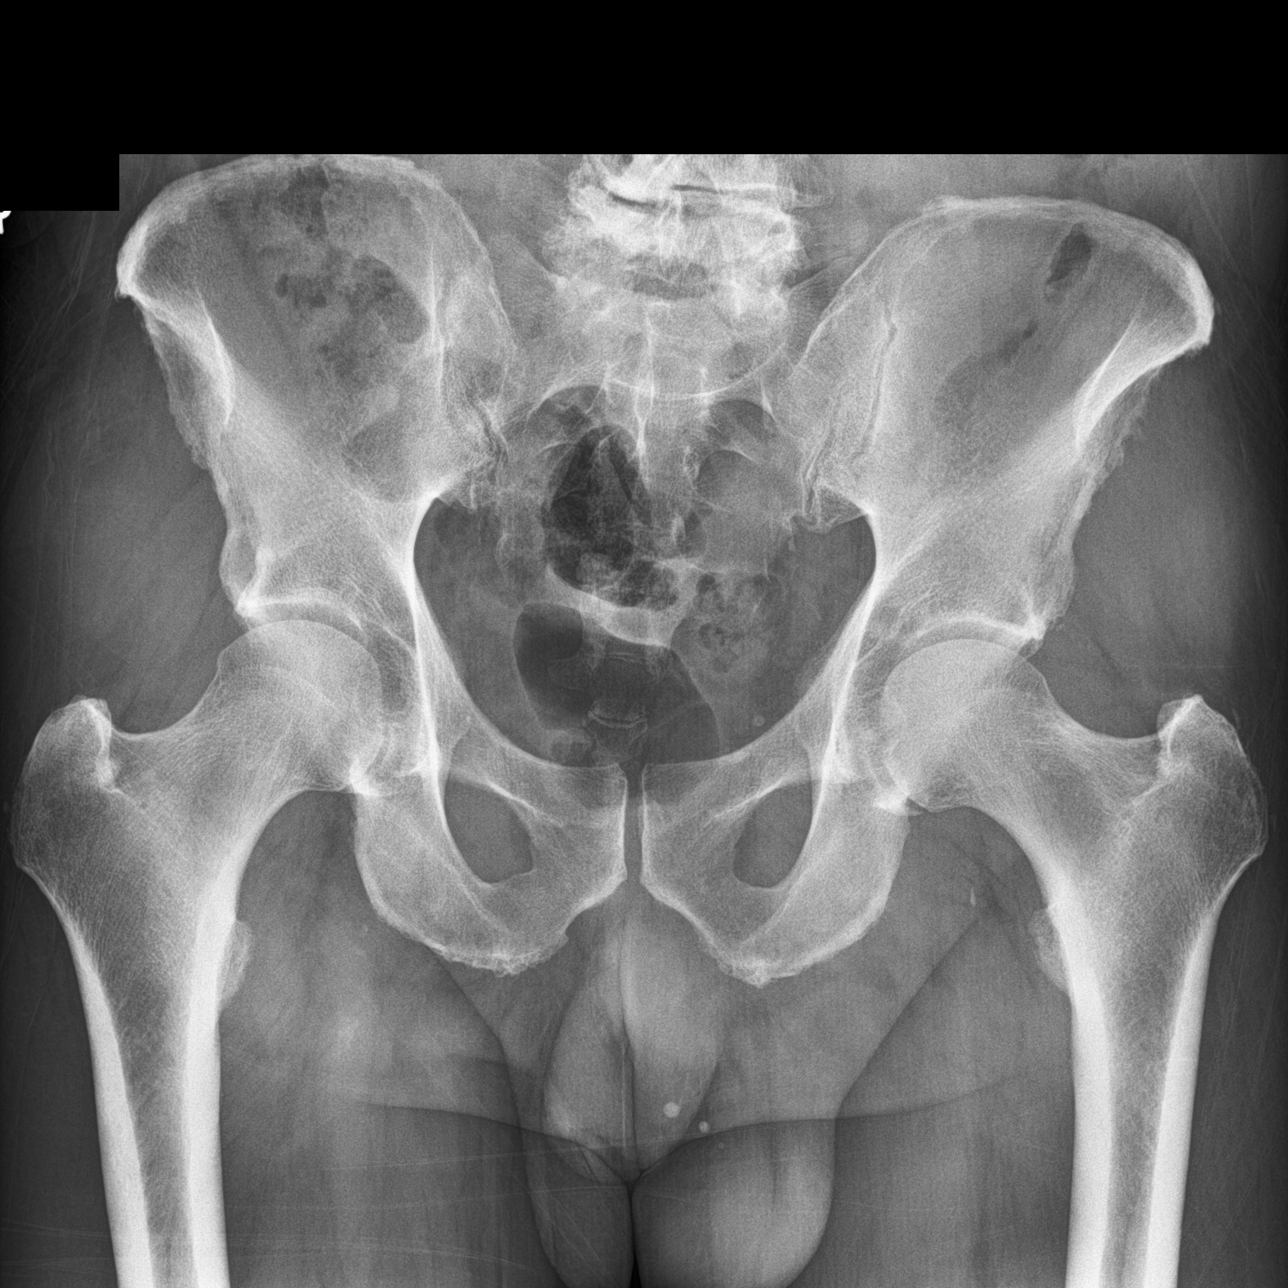
[im 2/3]
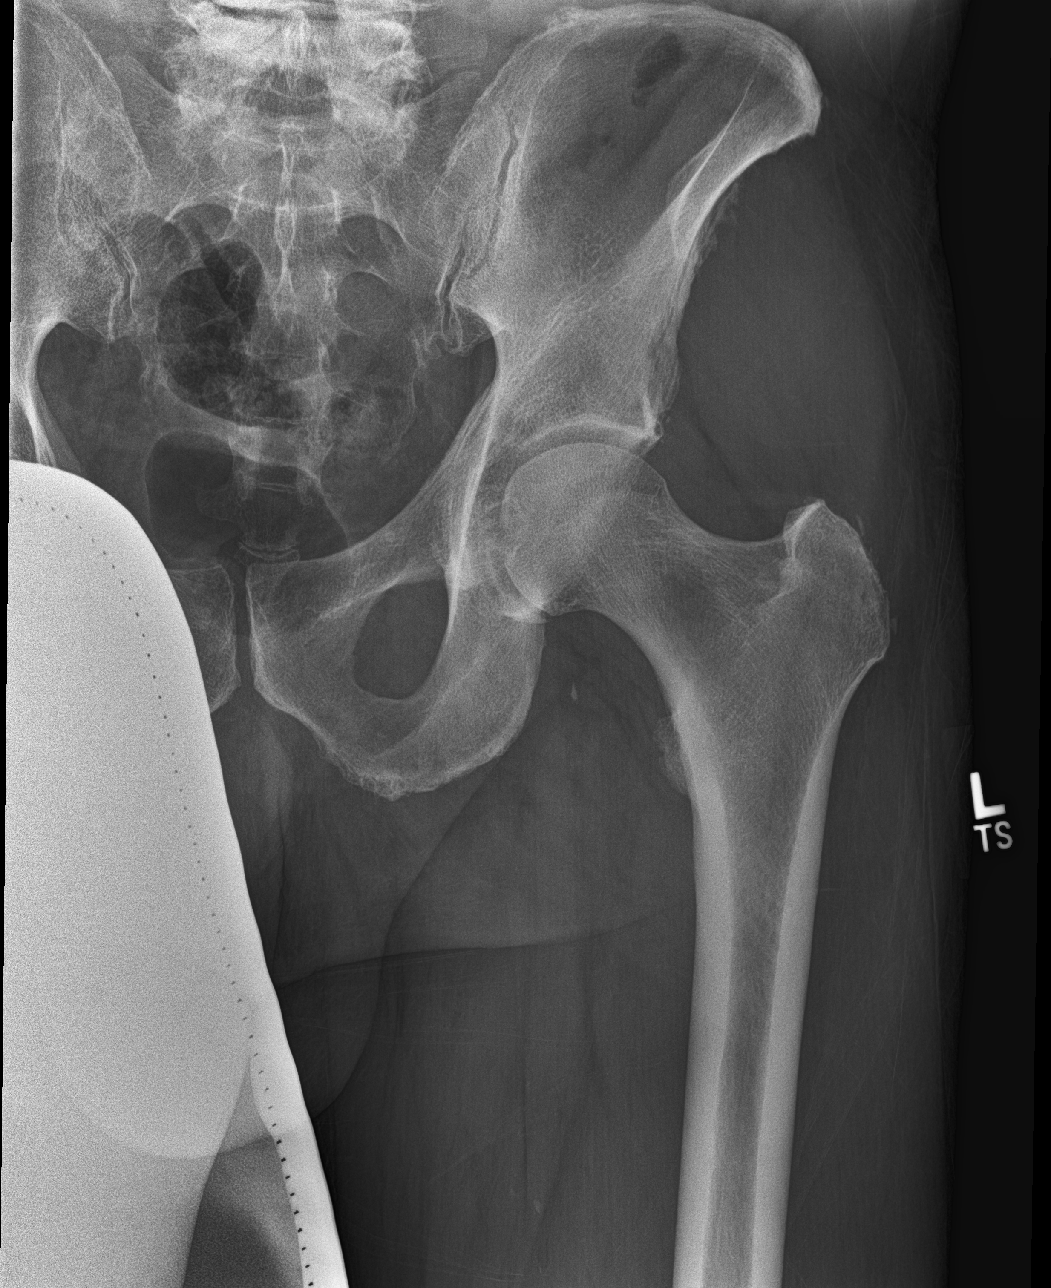
[im 3/3]
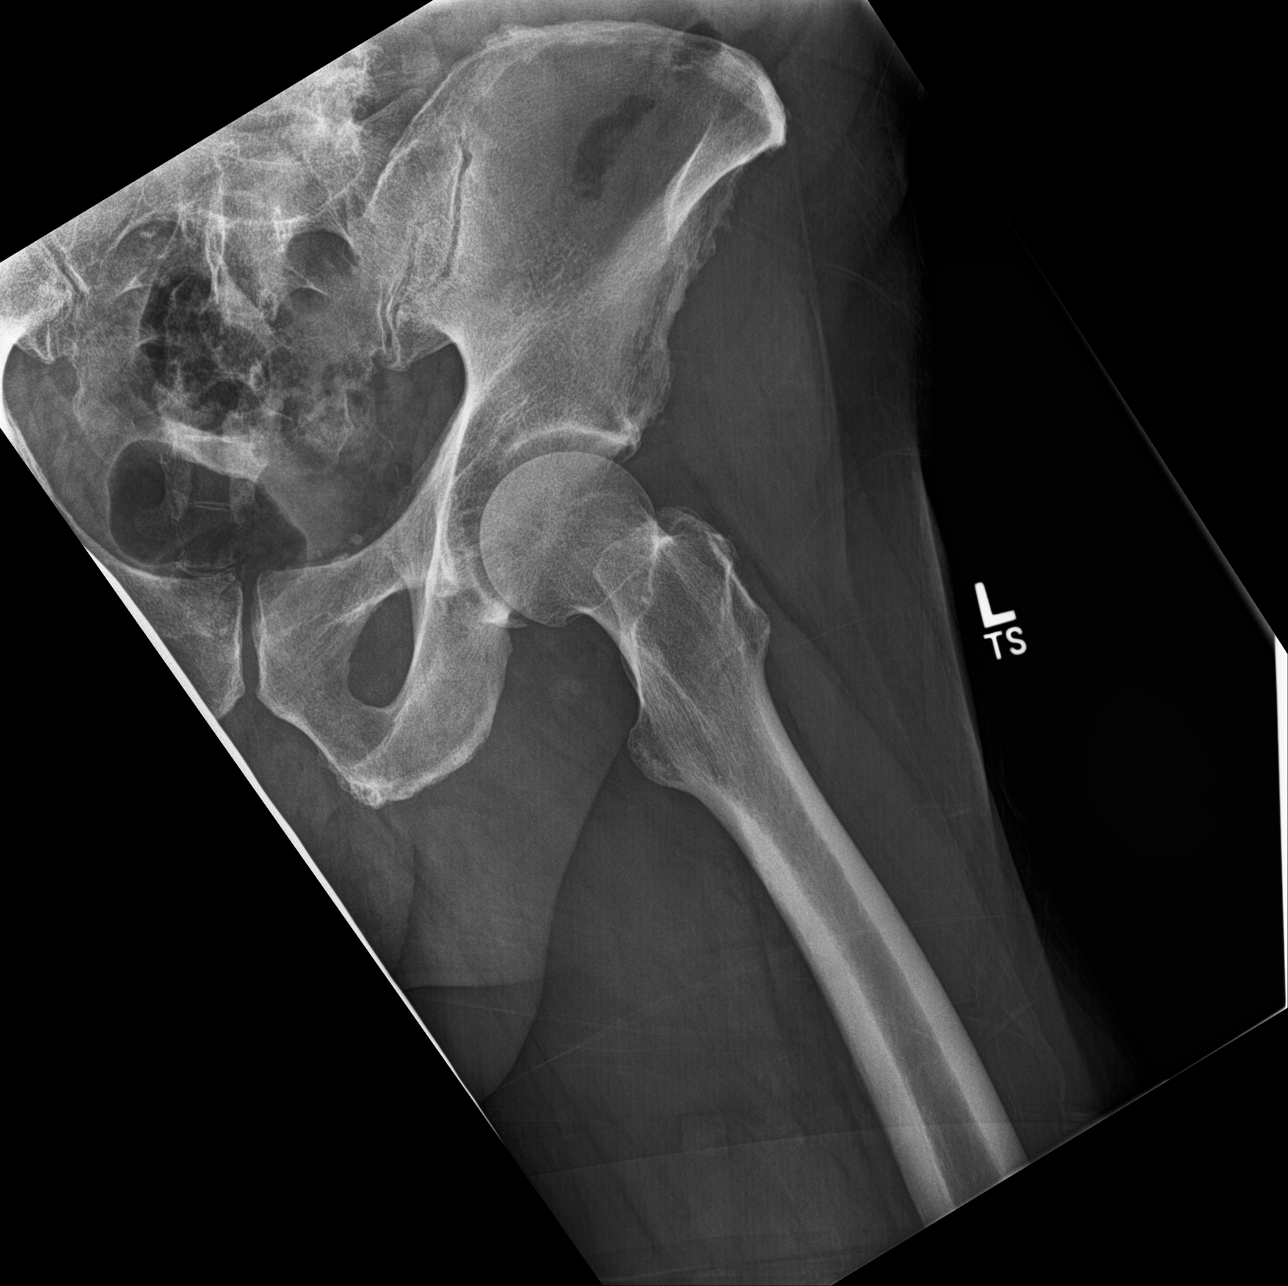

[3 of 3 positions shown; findings below may reference images not displayed]

FINDINGS: Degenerative changes lumbar spine and both hips. No acute bony or
joint abnormality. Pelvic calcifications consistent phleboliths.
Surgical clips right lower pelvis.
IMPRESSION: Degenerative changes lumbar spine and both hips. No acute bony
abnormality identified .

## 2017-02-14 ENCOUNTER — Ambulatory Visit (INDEPENDENT_AMBULATORY_CARE_PROVIDER_SITE_OTHER): Payer: Medicare Other | Admitting: Family Medicine

## 2017-02-14 ENCOUNTER — Encounter: Payer: Self-pay | Admitting: Family Medicine

## 2017-02-14 VITALS — BP 121/78 | HR 61 | Temp 97.8°F | Resp 16 | Ht 71.0 in | Wt 198.4 lb

## 2017-02-14 DIAGNOSIS — E782 Mixed hyperlipidemia: Secondary | ICD-10-CM

## 2017-02-14 DIAGNOSIS — I10 Essential (primary) hypertension: Secondary | ICD-10-CM

## 2017-02-14 MED ORDER — BENAZEPRIL HCL 20 MG PO TABS: 20.0000 mg | ORAL_TABLET | Freq: Every day | ORAL | 0 refills | Status: DC

## 2017-02-14 MED ORDER — AMLODIPINE BESYLATE 10 MG PO TABS: 10.0000 mg | ORAL_TABLET | Freq: Every day | ORAL | 0 refills | Status: DC

## 2017-02-14 NOTE — Progress Notes (Signed)
Name: Frank Hamilton   MRN: [id number]    DOB: [birth year]   Date:02/14/2017       Progress Note  Subjective  Chief Complaint  Chief Complaint  Patient presents with  . Follow-up    3 mo  . Medication Refill    Hypertension  This is a chronic problem. The problem is unchanged. The problem is controlled. Pertinent negatives include no blurred vision, chest pain, headaches, orthopnea, palpitations or shortness of breath. Past treatments include ACE inhibitors and calcium channel blockers. There is no history of kidney disease, CAD/MI or CVA.  Hyperlipidemia  This is a chronic problem. The problem is uncontrolled (elevated TGs and LDL). Recent lipid tests were reviewed and are high. Pertinent negatives include no chest pain, leg pain, myalgias or shortness of breath. Treatments tried: Fish oil. Risk factors for coronary artery disease include dyslipidemia.     Past Medical History:  Diagnosis Date  . Hyperlipidemia   . Hypertension     Past Surgical History:  Procedure Laterality Date  . HERNIA REPAIR  2009 and 2010    Family History  Problem Relation Age of Onset  . Heart disease Mother   . Stroke Father   . Heart disease Sister   . Diabetes Sister   . Diabetes Maternal Grandfather     Social History   Social History  . Marital status: Married    Spouse name: N/A  . Number of children: N/A  . Years of education: N/A   Occupational History  . Not on file.   Social History Main Topics  . Smoking status: Former Research scientist (life sciences)  . Smokeless tobacco: Never Used  . Alcohol use 0.0 oz/week  . Drug use: No  . Sexual activity: Not on file   Other Topics Concern  . Not on file   Social History Narrative  . No narrative on file     Current Outpatient Prescriptions:  .  amLODipine (NORVASC) 10 MG tablet, Take 1 tablet (10 mg total) by mouth daily., Disp: 90 tablet, Rfl: 0 .  aspirin 81 MG chewable tablet, Chew 81 mg by mouth., Disp: , Rfl:  .  benazepril (LOTENSIN) 20 MG  tablet, Take 1 tablet (20 mg total) by mouth daily., Disp: 90 tablet, Rfl: 0 .  Cholecalciferol (VITAMIN D) 2000 units CAPS, Take 1 capsule (2,000 Units total) by mouth daily., Disp: 30 capsule, Rfl:  .  glucosamine-chondroitin 500-400 MG tablet, Take 1 tablet by mouth 3 (three) times daily., Disp: , Rfl:  .  Omega-3 1000 MG CAPS, Take 1 capsule by mouth 2 (two) times daily., Disp: , Rfl:   Allergies  Allergen Reactions  . Sulfa Antibiotics      Review of Systems  Eyes: Negative for blurred vision.  Respiratory: Negative for shortness of breath.   Cardiovascular: Negative for chest pain, palpitations and orthopnea.  Musculoskeletal: Negative for myalgias.  Neurological: Negative for headaches.     Objective  Vitals:   02/14/17 1038  BP: 121/78  Pulse: 61  Resp: 16  Temp: 97.8 F (36.6 C)  TempSrc: Oral  SpO2: 94%  Weight: 198 lb 6.4 oz (90 kg)  Height: 5\' 11"  (1.803 m)    Physical Exam  Constitutional: He is oriented to person, place, and time and well-developed, well-nourished, and in no distress.  Cardiovascular: Normal rate, regular rhythm and normal heart sounds.   No murmur heard. Pulmonary/Chest: Effort normal and breath sounds normal. He has no wheezes.  Abdominal: Soft. Bowel sounds are normal.  There is no tenderness.  Musculoskeletal: He exhibits no edema.  Neurological: He is alert and oriented to person, place, and time.  Psychiatric: Mood, memory, affect and judgment normal.  Nursing note and vitals reviewed.      Assessment & Plan  1. Essential hypertension  - amLODipine (NORVASC) 10 MG tablet; Take 1 tablet (10 mg total) by mouth daily.  Dispense: 90 tablet; Refill: 0 - benazepril (LOTENSIN) 20 MG tablet; Take 1 tablet (20 mg total) by mouth daily.  Dispense: 90 tablet; Refill: 0  2. Mixed hyperlipidemia We'll obtain FLP, he does not want to be started on statin therapy, we will discuss after review of lab work - Lipid panel   Izac Faulkenberry Asad A.  [facility] 02/14/2017 11:02 AM

## 2017-02-23 DIAGNOSIS — E782 Mixed hyperlipidemia: Secondary | ICD-10-CM | POA: Diagnosis not present

## 2017-02-23 LAB — LIPID PANEL
CHOL/HDL RATIO: 7 ratio — AB (ref <5.0)
Cholesterol: 195 mg/dL (ref <200)
HDL: 28 mg/dL — ABNORMAL LOW (ref >40)
LDL Cholesterol: 115 mg/dL — ABNORMAL HIGH (ref <100)
Triglycerides: 259 mg/dL — ABNORMAL HIGH (ref <150)
VLDL: 52 mg/dL — AB (ref <30)

## 2017-03-19 ENCOUNTER — Encounter: Payer: Self-pay | Admitting: Family Medicine

## 2017-03-19 ENCOUNTER — Ambulatory Visit (INDEPENDENT_AMBULATORY_CARE_PROVIDER_SITE_OTHER): Payer: Medicare Other | Admitting: Family Medicine

## 2017-03-19 VITALS — BP 121/68 | HR 60 | Temp 98.2°F | Resp 16 | Ht 71.0 in | Wt 201.2 lb

## 2017-03-19 DIAGNOSIS — N429 Disorder of prostate, unspecified: Secondary | ICD-10-CM | POA: Diagnosis not present

## 2017-03-19 DIAGNOSIS — I10 Essential (primary) hypertension: Secondary | ICD-10-CM | POA: Diagnosis not present

## 2017-03-19 DIAGNOSIS — Z0000 Encounter for general adult medical examination without abnormal findings: Secondary | ICD-10-CM | POA: Diagnosis not present

## 2017-03-19 DIAGNOSIS — E559 Vitamin D deficiency, unspecified: Secondary | ICD-10-CM | POA: Diagnosis not present

## 2017-03-19 LAB — CBC WITH DIFFERENTIAL/PLATELET
Basophils Absolute: 0 cells/uL (ref 0–200)
Basophils Relative: 0 %
EOS PCT: 4 %
Eosinophils Absolute: 280 cells/uL (ref 15–500)
HCT: 41.4 % (ref 38.5–50.0)
Hemoglobin: 13.9 g/dL (ref 13.2–17.1)
LYMPHS PCT: 31 %
Lymphs Abs: 2170 cells/uL (ref 850–3900)
MCH: 29.3 pg (ref 27.0–33.0)
MCHC: 33.6 g/dL (ref 32.0–36.0)
MCV: 87.2 fL (ref 80.0–100.0)
MONOS PCT: 8 %
MPV: 9.3 fL (ref 7.5–12.5)
Monocytes Absolute: 560 cells/uL (ref 200–950)
NEUTROS PCT: 57 %
Neutro Abs: 3990 cells/uL (ref 1500–7800)
Platelets: 209 10*3/uL (ref 140–400)
RBC: 4.75 MIL/uL (ref 4.20–5.80)
RDW: 16.1 % — AB (ref 11.0–15.0)
WBC: 7 10*3/uL (ref 3.8–10.8)

## 2017-03-19 LAB — TSH: TSH: 3.89 m[IU]/L (ref 0.40–4.50)

## 2017-03-19 LAB — PSA: PSA: 0.9 ng/mL (ref <=4.0)

## 2017-03-19 NOTE — Progress Notes (Signed)
Name: Frank Hamilton   MRN: [id number]    DOB: July 07, 1941   Date:03/19/2017       Progress Note  Subjective  Chief Complaint  Chief Complaint  Patient presents with  . Annual Exam    CPE    HPI  Pt. Presents for Complete Physical Exam. Last colonoscopy was 3-4 years ago, was reportedly normal and was asked to come back in 10 years. He is due for PSA and DRE.    Past Medical History:  Diagnosis Date  . Hyperlipidemia   . Hypertension     Past Surgical History:  Procedure Laterality Date  . HERNIA REPAIR  2009 and 2010    Family History  Problem Relation Age of Onset  . Heart disease Mother   . Stroke Father   . Heart disease Sister   . Diabetes Sister   . Diabetes Maternal Grandfather     Social History   Social History  . Marital status: Married    Spouse name: N/A  . Number of children: N/A  . Years of education: N/A   Occupational History  . Not on file.   Social History Main Topics  . Smoking status: Former Research scientist (life sciences)  . Smokeless tobacco: Never Used  . Alcohol use 0.0 oz/week  . Drug use: No  . Sexual activity: Not on file   Other Topics Concern  . Not on file   Social History Narrative  . No narrative on file     Current Outpatient Prescriptions:  .  amLODipine (NORVASC) 10 MG tablet, Take 1 tablet (10 mg total) by mouth daily., Disp: 90 tablet, Rfl: 0 .  aspirin 81 MG chewable tablet, Chew 81 mg by mouth., Disp: , Rfl:  .  benazepril (LOTENSIN) 20 MG tablet, Take 1 tablet (20 mg total) by mouth daily., Disp: 90 tablet, Rfl: 0 .  Cholecalciferol (VITAMIN D) 2000 units CAPS, Take 1 capsule (2,000 Units total) by mouth daily., Disp: 30 capsule, Rfl:  .  glucosamine-chondroitin 500-400 MG tablet, Take 1 tablet by mouth 3 (three) times daily., Disp: , Rfl:  .  Omega-3 1000 MG CAPS, Take 1 capsule by mouth 2 (two) times daily., Disp: , Rfl:   Allergies  Allergen Reactions  . Sulfa Antibiotics      Review of Systems  Constitutional: Positive  for malaise/fatigue. Negative for chills, fever and weight loss.  HENT: Negative for congestion (takes Claritin for pollen allergies), ear pain and sore throat.   Eyes: Negative for blurred vision and double vision.  Respiratory: Negative for cough (occasional coughing), sputum production and shortness of breath.   Cardiovascular: Negative for chest pain and leg swelling.  Gastrointestinal: Negative for abdominal pain, blood in stool, constipation, nausea and vomiting.  Genitourinary: Negative for dysuria, frequency and hematuria.  Musculoskeletal: Negative for back pain and neck pain.  Neurological: Negative for dizziness and headaches.  Psychiatric/Behavioral: Negative for depression. The patient is not nervous/anxious and does not have insomnia.       Objective  Vitals:   03/19/17 1140  BP: 121/68  Pulse: 60  Resp: 16  Temp: 98.2 F (36.8 C)  TempSrc: Oral  SpO2: 93%  Weight: 201 lb 3.2 oz (91.3 kg)  Height: 5\' 11"  (1.803 m)    Physical Exam  Constitutional: He is oriented to person, place, and time and well-developed, well-nourished, and in no distress.  HENT:  Head: Normocephalic and atraumatic.  Right Ear: External ear normal.  Left Ear: External ear normal.  Mouth/Throat: Oropharynx  is clear and moist.  Eyes: Conjunctivae are normal. Pupils are equal, round, and reactive to light.  Cardiovascular: Normal rate, regular rhythm and normal heart sounds.   No murmur heard. Pulmonary/Chest: Effort normal and breath sounds normal. He has no wheezes.  Abdominal: Soft. Bowel sounds are normal. There is no tenderness.  Genitourinary: Rectum normal and prostate normal. Prostate is not enlarged.  Musculoskeletal: He exhibits no edema.  Neurological: He is alert and oriented to person, place, and time.  Skin: Skin is warm, dry and intact.  Psychiatric: Mood, memory, affect and judgment normal.  Nursing note and vitals reviewed.      Assessment & Plan  1. Annual  physical exam Obtain age-appropriate laboratory screenings. Patient advised to bring records of colonoscopy to be scanned in his chart. - CBC with Differential/Platelet - TSH - VITAMIN D 25 Hydroxy (Vit-D Deficiency, Fractures) - PSA   Frank Hamilton Frank Hamilton A[facility] 03/19/2017 12:05 PM

## 2017-03-20 LAB — VITAMIN D 25 HYDROXY (VIT D DEFICIENCY, FRACTURES): VIT D 25 HYDROXY: 32 ng/mL (ref 30–100)

## 2017-04-12 DIAGNOSIS — H524 Presbyopia: Secondary | ICD-10-CM | POA: Diagnosis not present

## 2017-06-19 ENCOUNTER — Ambulatory Visit: Payer: Medicare Other | Admitting: Family Medicine

## 2017-06-19 DIAGNOSIS — H40003 Preglaucoma, unspecified, bilateral: Secondary | ICD-10-CM | POA: Diagnosis not present

## 2017-06-20 ENCOUNTER — Ambulatory Visit: Payer: Medicare Other | Admitting: Family Medicine

## 2017-06-27 ENCOUNTER — Ambulatory Visit (INDEPENDENT_AMBULATORY_CARE_PROVIDER_SITE_OTHER): Payer: Medicare Other | Admitting: Family Medicine

## 2017-06-27 ENCOUNTER — Encounter: Payer: Self-pay | Admitting: Family Medicine

## 2017-06-27 VITALS — BP 123/67 | HR 63 | Temp 97.6°F | Resp 16 | Ht 71.0 in | Wt 204.1 lb

## 2017-06-27 DIAGNOSIS — G8929 Other chronic pain: Secondary | ICD-10-CM | POA: Diagnosis not present

## 2017-06-27 DIAGNOSIS — R011 Cardiac murmur, unspecified: Secondary | ICD-10-CM | POA: Diagnosis not present

## 2017-06-27 DIAGNOSIS — E782 Mixed hyperlipidemia: Secondary | ICD-10-CM | POA: Diagnosis not present

## 2017-06-27 DIAGNOSIS — R29898 Other symptoms and signs involving the musculoskeletal system: Secondary | ICD-10-CM | POA: Diagnosis not present

## 2017-06-27 DIAGNOSIS — I10 Essential (primary) hypertension: Secondary | ICD-10-CM | POA: Diagnosis not present

## 2017-06-27 DIAGNOSIS — M79605 Pain in left leg: Secondary | ICD-10-CM | POA: Diagnosis not present

## 2017-06-27 DIAGNOSIS — M79604 Pain in right leg: Secondary | ICD-10-CM | POA: Diagnosis not present

## 2017-06-27 MED ORDER — AMLODIPINE BESYLATE 10 MG PO TABS: 10.0000 mg | ORAL_TABLET | Freq: Every day | ORAL | 0 refills | Status: DC

## 2017-06-27 MED ORDER — BENAZEPRIL HCL 20 MG PO TABS: 20.0000 mg | ORAL_TABLET | Freq: Every day | ORAL | 0 refills | Status: DC

## 2017-06-27 NOTE — Progress Notes (Signed)
Name: Frank Hamilton   MRN: [id number]    DOB: 1941-05-01   Date:06/27/2017       Progress Note  Subjective  Chief Complaint  Chief Complaint  Patient presents with  . Follow-up    3 mo  . Medication Refill  . Hyperlipidemia    Hyperlipidemia  This is a chronic problem. The problem is uncontrolled (elevated TGs and LDL). Recent lipid tests were reviewed and are high. Pertinent negatives include no chest pain, leg pain or shortness of breath. Treatments tried: Fish oil, has never been on statins, does not want to be on statins because of risk of side effects. Risk factors for coronary artery disease include dyslipidemia.  Hypertension  This is a chronic problem. The problem is unchanged. The problem is controlled. Pertinent negatives include no blurred vision, chest pain, headaches, orthopnea, palpitations or shortness of breath. Past treatments include ACE inhibitors and calcium channel blockers. There is no history of kidney disease, CAD/MI or CVA.   Patient is also concerned about chronic bilateral lower extremity fatigue and stiffness, worse with moderate activity, no leg pain, no numbness, tingling etc. He feels as if his legs 'stay tired', no gait disturbance, no shortness of breath,has not needed to take any medication to relieve his symptoms.  Past Medical History:  Diagnosis Date  . Hyperlipidemia   . Hypertension     Past Surgical History:  Procedure Laterality Date  . HERNIA REPAIR  2009 and 2010    Family History  Problem Relation Age of Onset  . Heart disease Mother   . Stroke Father   . Heart disease Sister   . Diabetes Sister   . Diabetes Maternal Grandfather     Social History   Social History  . Marital status: Married    Spouse name: N/A  . Number of children: N/A  . Years of education: N/A   Occupational History  . Not on file.   Social History Main Topics  . Smoking status: Former Research scientist (life sciences)  . Smokeless tobacco: Never Used  . Alcohol use 0.0  oz/week  . Drug use: No  . Sexual activity: Not on file   Other Topics Concern  . Not on file   Social History Narrative  . No narrative on file     Current Outpatient Prescriptions:  .  amLODipine (NORVASC) 10 MG tablet, Take 1 tablet (10 mg total) by mouth daily., Disp: 90 tablet, Rfl: 0 .  aspirin 81 MG chewable tablet, Chew 81 mg by mouth., Disp: , Rfl:  .  benazepril (LOTENSIN) 20 MG tablet, Take 1 tablet (20 mg total) by mouth daily., Disp: 90 tablet, Rfl: 0 .  Cholecalciferol (VITAMIN D) 2000 units CAPS, Take 1 capsule (2,000 Units total) by mouth daily., Disp: 30 capsule, Rfl:  .  glucosamine-chondroitin 500-400 MG tablet, Take 1 tablet by mouth 3 (three) times daily., Disp: , Rfl:  .  Omega-3 1000 MG CAPS, Take 1 capsule by mouth 2 (two) times daily., Disp: , Rfl:   Allergies  Allergen Reactions  . Sulfa Antibiotics      Review of Systems  Eyes: Negative for blurred vision.  Respiratory: Negative for shortness of breath.   Cardiovascular: Negative for chest pain, palpitations and orthopnea.  Neurological: Negative for headaches.      Objective  Vitals:   06/27/17 1029  BP: 123/67  Pulse: 63  Resp: 16  Temp: 97.6 F (36.4 C)  TempSrc: Oral  SpO2: 96%  Weight: 204 lb 1.6 oz (  92.6 kg)  Height: 5\' 11"  (1.803 m)    Physical Exam  Constitutional: He is oriented to person, place, and time and well-developed, well-nourished, and in no distress.  HENT:  Head: Normocephalic and atraumatic.  Right Ear: External ear normal.  Left Ear: External ear normal.  Cardiovascular: Normal rate, regular rhythm, S1 normal, S2 normal and intact distal pulses.   Murmur heard.  Systolic murmur is present with a grade of 2/6  Pulses:      Dorsalis pedis pulses are 2+ on the right side, and 2+ on the left side.       Posterior tibial pulses are 2+ on the right side, and 2+ on the left side.  Pulmonary/Chest: Effort normal and breath sounds normal. He has no wheezes.   Abdominal: Soft. Bowel sounds are normal. There is no tenderness.  Musculoskeletal: He exhibits no edema.       Right ankle: He exhibits no swelling.       Left ankle: He exhibits no swelling.       Right lower leg: He exhibits no tenderness, no swelling and no deformity.       Left lower leg: He exhibits no tenderness, no swelling and no deformity.  Neurological: He is alert and oriented to person, place, and time.  Psychiatric: Mood, memory, affect and judgment normal.  Nursing note and vitals reviewed.    Assessment & Plan  1. Essential hypertension BP stable on present antihypertensive treatment - amLODipine (NORVASC) 10 MG tablet; Take 1 tablet (10 mg total) by mouth daily.  Dispense: 90 tablet; Refill: 0 - benazepril (LOTENSIN) 20 MG tablet; Take 1 tablet (20 mg total) by mouth daily.  Dispense: 90 tablet; Refill: 0  2. Mixed hyperlipidemia Obtain FLP, consider starting on low-dose Pitavastatin - Lipid panel - COMPLETE METABOLIC PANEL WITH GFR  3. Leg fatigue Differential includes electrolyte disturbance versus  Vitamin deficiency versus deconditioning. Obtain lab work and follow-up - BASIC METABOLIC PANEL WITH GFR - Magnesium - B12 - VITAMIN D 25 Hydroxy (Vit-D Deficiency, Fractures)  4. Systolic murmur Systolic murmur, no symptoms, likely benign.We'll refer to cardiology - Ambulatory referral to Cardiology   [facility] 06/27/2017 10:38 AM

## 2017-06-29 LAB — BASIC METABOLIC PANEL WITH GFR
BUN: 15 mg/dL (ref 7–25)
CHLORIDE: 105 mmol/L (ref 98–110)
CO2: 23 mmol/L (ref 20–32)
CREATININE: 0.73 mg/dL (ref 0.70–1.18)
Calcium: 9.4 mg/dL (ref 8.6–10.3)
GFR, Est Non African American: >89 mL/min (ref >=60)
GLUCOSE: 96 mg/dL (ref 65–99)
POTASSIUM: 4.3 mmol/L (ref 3.5–5.3)
Sodium: 141 mmol/L (ref 135–146)

## 2017-06-29 LAB — COMPLETE METABOLIC PANEL WITHOUT GFR
ALT: 33 U/L (ref 9–46)
AST: 28 U/L (ref 10–35)
Albumin: 4.3 g/dL (ref 3.6–5.1)
Alkaline Phosphatase: 49 U/L (ref 40–115)
BUN: 15 mg/dL (ref 7–25)
CO2: 24 mmol/L (ref 20–32)
Calcium: 9.4 mg/dL (ref 8.6–10.3)
Chloride: 105 mmol/L (ref 98–110)
Creat: 0.74 mg/dL (ref 0.70–1.18)
GFR, Est African American: >89 mL/min (ref >=60)
GFR, Est Non African American: >89 mL/min (ref >=60)
Glucose, Bld: 98 mg/dL (ref 65–99)
Potassium: 4.5 mmol/L (ref 3.5–5.3)
Sodium: 141 mmol/L (ref 135–146)
Total Bilirubin: 0.6 mg/dL (ref 0.2–1.2)
Total Protein: 6.6 g/dL (ref 6.1–8.1)

## 2017-06-29 LAB — LIPID PANEL
Cholesterol: 186 mg/dL (ref <200)
HDL: 23 mg/dL — ABNORMAL LOW (ref >40)
Total CHOL/HDL Ratio: 8.1 ratio — ABNORMAL HIGH (ref <5.0)
Triglycerides: 470 mg/dL — ABNORMAL HIGH (ref <150)

## 2017-06-29 LAB — VITAMIN D 25 HYDROXY (VIT D DEFICIENCY, FRACTURES): Vit D, 25-Hydroxy: 38 ng/mL (ref 30–100)

## 2017-06-29 LAB — VITAMIN B12: Vitamin B-12: 552 pg/mL (ref 200–1100)

## 2017-06-29 LAB — MAGNESIUM: Magnesium: 1.9 mg/dL (ref 1.5–2.5)

## 2017-08-01 ENCOUNTER — Other Ambulatory Visit: Payer: Self-pay | Admitting: Family Medicine

## 2017-08-01 DIAGNOSIS — I10 Essential (primary) hypertension: Secondary | ICD-10-CM

## 2017-08-03 ENCOUNTER — Telehealth: Payer: Self-pay | Admitting: Family Medicine

## 2017-08-03 DIAGNOSIS — I10 Essential (primary) hypertension: Secondary | ICD-10-CM

## 2017-08-03 MED ORDER — BENAZEPRIL HCL 20 MG PO TABS: 20.0000 mg | ORAL_TABLET | Freq: Every day | ORAL | 0 refills | Status: DC

## 2017-08-03 MED ORDER — AMLODIPINE BESYLATE 10 MG PO TABS: 10.0000 mg | ORAL_TABLET | Freq: Every day | ORAL | 0 refills | Status: DC

## 2017-08-03 NOTE — Telephone Encounter (Signed)
Prescriptions for amlodipine and benazepril have been sent to patient's pharmacy

## 2017-08-03 NOTE — Telephone Encounter (Signed)
Pt is requesting a refill on amlodipine and denazepril. I informed him that a 90 day supply was sent to his pharmacy in august. He then stated that he did not pick that prescription up because he still had some at home. He is asking that you please resend.

## 2017-08-04 ENCOUNTER — Other Ambulatory Visit: Payer: Self-pay | Admitting: Family Medicine

## 2017-08-04 DIAGNOSIS — I10 Essential (primary) hypertension: Secondary | ICD-10-CM

## 2017-08-06 MED ORDER — AMLODIPINE BESYLATE 10 MG PO TABS: 10.0000 mg | ORAL_TABLET | Freq: Every day | ORAL | 0 refills | Status: DC

## 2017-08-06 MED ORDER — BENAZEPRIL HCL 20 MG PO TABS: 20.0000 mg | ORAL_TABLET | Freq: Every day | ORAL | 0 refills | Status: DC

## 2017-08-06 NOTE — Telephone Encounter (Signed)
Medication has been refilled and sent to correct [address] [facility] has been removed from chart per patient

## 2017-08-06 NOTE — Telephone Encounter (Signed)
Pt informed that prescriptions has been sent to pharmacy, he stated that it was sent to wrong pharmacy. He is asking that [facility] and please call it into [facility]

## 2017-08-30 ENCOUNTER — Ambulatory Visit: Payer: Medicare Other | Admitting: Cardiovascular Disease

## 2017-09-19 DIAGNOSIS — H40003 Preglaucoma, unspecified, bilateral: Secondary | ICD-10-CM | POA: Diagnosis not present

## 2017-09-27 ENCOUNTER — Encounter: Payer: Self-pay | Admitting: Family Medicine

## 2017-09-27 ENCOUNTER — Ambulatory Visit (INDEPENDENT_AMBULATORY_CARE_PROVIDER_SITE_OTHER): Payer: Medicare Other | Admitting: Family Medicine

## 2017-09-27 VITALS — BP 128/66 | HR 75 | Temp 97.9°F | Resp 16 | Ht 71.0 in | Wt 204.5 lb

## 2017-09-27 DIAGNOSIS — Z23 Encounter for immunization: Secondary | ICD-10-CM | POA: Diagnosis not present

## 2017-09-27 DIAGNOSIS — E782 Mixed hyperlipidemia: Secondary | ICD-10-CM | POA: Diagnosis not present

## 2017-09-27 DIAGNOSIS — I10 Essential (primary) hypertension: Secondary | ICD-10-CM | POA: Diagnosis not present

## 2017-09-27 MED ORDER — BENAZEPRIL HCL 20 MG PO TABS: 20.0000 mg | ORAL_TABLET | Freq: Every day | ORAL | 0 refills | Status: DC

## 2017-09-27 MED ORDER — AMLODIPINE BESYLATE 10 MG PO TABS: 10.0000 mg | ORAL_TABLET | Freq: Every day | ORAL | 0 refills | Status: DC

## 2017-09-27 NOTE — Progress Notes (Signed)
Name: Frank Hamilton   MRN: [id number]    DOB: 11/11/1941   Date:09/27/2017       Progress Note  Subjective  Chief Complaint  Chief Complaint  Patient presents with  . Hypertension    f/u  . Medication Refill    Bp meds    Hypertension  This is a chronic problem. The problem is unchanged. The problem is controlled. Pertinent negatives include no blurred vision or palpitations. Past treatments include ACE inhibitors and calcium channel blockers. There is no history of kidney disease, CAD/MI or CVA.  Hyperlipidemia  This is a chronic problem. The problem is uncontrolled. Recent lipid tests were reviewed and are high. Treatments tried: taking OTC Fish oil. Risk factors for coronary artery disease include dyslipidemia and male sex.     Past Medical History:  Diagnosis Date  . Hyperlipidemia   . Hypertension     Past Surgical History:  Procedure Laterality Date  . HERNIA REPAIR  2009 and 2010    Family History  Problem Relation Age of Onset  . Heart disease Mother   . Stroke Father   . Heart disease Sister   . Diabetes Sister   . Diabetes Maternal Grandfather     Social History   Socioeconomic History  . Marital status: Married    Spouse name: Not on file  . Number of children: Not on file  . Years of education: Not on file  . Highest education level: Not on file  Social Needs  . Financial resource strain: Not on file  . Food insecurity - worry: Not on file  . Food insecurity - inability: Not on file  . Transportation needs - medical: Not on file  . Transportation needs - non-medical: Not on file  Occupational History  . Not on file  Tobacco Use  . Smoking status: Former Research scientist (life sciences)  . Smokeless tobacco: Never Used  Substance and Sexual Activity  . Alcohol use: Yes    Alcohol/week: 0.0 oz  . Drug use: No  . Sexual activity: Not on file  Other Topics Concern  . Not on file  Social History Narrative  . Not on file     Current Outpatient Medications:  .   amLODipine (NORVASC) 10 MG tablet, Take 1 tablet (10 mg total) by mouth daily., Disp: 90 tablet, Rfl: 0 .  aspirin 81 MG chewable tablet, Chew 81 mg by mouth., Disp: , Rfl:  .  benazepril (LOTENSIN) 20 MG tablet, Take 1 tablet (20 mg total) by mouth daily., Disp: 90 tablet, Rfl: 0 .  Cholecalciferol (VITAMIN D) 2000 units CAPS, Take 1 capsule (2,000 Units total) by mouth daily., Disp: 30 capsule, Rfl:  .  glucosamine-chondroitin 500-400 MG tablet, Take 1 tablet by mouth 3 (three) times daily., Disp: , Rfl:  .  Omega-3 1000 MG CAPS, Take 1 capsule by mouth 2 (two) times daily., Disp: , Rfl:  .  amLODipine (NORVASC) 10 MG tablet, Take 1 tablet (10 mg total) by mouth daily. (Patient not taking: Reported on 09/27/2017), Disp: 90 tablet, Rfl: 0 .  benazepril (LOTENSIN) 20 MG tablet, Take 1 tablet (20 mg total) by mouth daily. (Patient not taking: Reported on 09/27/2017), Disp: 90 tablet, Rfl: 0  Allergies  Allergen Reactions  . Sulfa Antibiotics      Review of Systems  Eyes: Negative for blurred vision.  Cardiovascular: Negative for palpitations.     Objective  Vitals:   09/27/17 1019  BP: 128/66  Pulse: 75  Resp: 16  Temp: 97.9 F (36.6 C)  TempSrc: Oral  SpO2: 95%  Weight: 204 lb 8 oz (92.8 kg)  Height: 5\' 11"  (1.803 m)    Physical Exam  Constitutional: He is oriented to person, place, and time and well-developed, well-nourished, and in no distress.  Cardiovascular: Normal rate, regular rhythm and normal heart sounds.  No murmur heard. Pulmonary/Chest: Effort normal and breath sounds normal. He has no wheezes.  Abdominal: Soft. Bowel sounds are normal. There is no tenderness.  Musculoskeletal: He exhibits no edema.  Neurological: He is alert and oriented to person, place, and time.  Psychiatric: Mood, memory, affect and judgment normal.  Nursing note and vitals reviewed.     Assessment & Plan  1. Needs flu shot  - Flu vaccine HIGH DOSE PF (Fluzone High dose)  2.  Essential hypertension BP stable on present antihypertensive treatment - benazepril (LOTENSIN) 20 MG tablet; Take 1 tablet (20 mg total) daily by mouth.  Dispense: 90 tablet; Refill: 0 - amLODipine (NORVASC) 10 MG tablet; Take 1 tablet (10 mg total) daily by mouth.  Dispense: 90 tablet; Refill: 0  3. Mixed hyperlipidemia Discussed triglyceride lowering therapy, repeat FLP and treat accordingly - Lipid panel - COMPLETE METABOLIC PANEL WITH GFR   Mahmoud Blazejewski [facility] 09/27/2017 10:46 AM

## 2017-09-28 ENCOUNTER — Ambulatory Visit: Payer: Medicare Other | Admitting: Cardiovascular Disease

## 2017-09-28 DIAGNOSIS — E782 Mixed hyperlipidemia: Secondary | ICD-10-CM | POA: Diagnosis not present

## 2017-09-28 LAB — COMPLETE METABOLIC PANEL WITH GFR
AG Ratio: 1.9 (calc) (ref 1.0–2.5)
ALBUMIN MSPROF: 4.3 g/dL (ref 3.6–5.1)
ALT: 32 U/L (ref 9–46)
AST: 28 U/L (ref 10–35)
Alkaline phosphatase (APISO): 50 U/L (ref 40–115)
BUN: 14 mg/dL (ref 7–25)
CALCIUM: 9.5 mg/dL (ref 8.6–10.3)
CO2: 31 mmol/L (ref 20–32)
CREATININE: 0.81 mg/dL (ref 0.70–1.18)
Chloride: 104 mmol/L (ref 98–110)
GFR, EST AFRICAN AMERICAN: 101 mL/min/{1.73_m2} (ref >=60)
GFR, Est Non African American: 88 mL/min/{1.73_m2} (ref >=60)
GLUCOSE: 88 mg/dL (ref 65–99)
Globulin: 2.3 g/dL (calc) (ref 1.9–3.7)
Potassium: 4.6 mmol/L (ref 3.5–5.3)
Sodium: 141 mmol/L (ref 135–146)
TOTAL PROTEIN: 6.6 g/dL (ref 6.1–8.1)
Total Bilirubin: 0.6 mg/dL (ref 0.2–1.2)

## 2017-09-28 LAB — LIPID PANEL
Cholesterol: 187 mg/dL (ref <200)
HDL: 26 mg/dL — ABNORMAL LOW (ref >40)
LDL Cholesterol (Calc): 105 mg/dL (calc) — ABNORMAL HIGH
NON-HDL CHOLESTEROL (CALC): 161 mg/dL — AB (ref <130)
Total CHOL/HDL Ratio: 7.2 (calc) — ABNORMAL HIGH (ref <5.0)
Triglycerides: 395 mg/dL — ABNORMAL HIGH (ref <150)

## 2017-10-01 ENCOUNTER — Encounter: Payer: Self-pay | Admitting: Cardiovascular Disease

## 2017-10-01 ENCOUNTER — Ambulatory Visit (INDEPENDENT_AMBULATORY_CARE_PROVIDER_SITE_OTHER): Payer: Medicare Other | Admitting: Cardiovascular Disease

## 2017-10-01 VITALS — BP 148/62 | HR 67 | Ht 71.0 in | Wt 204.8 lb

## 2017-10-01 DIAGNOSIS — R011 Cardiac murmur, unspecified: Secondary | ICD-10-CM

## 2017-10-01 DIAGNOSIS — I10 Essential (primary) hypertension: Secondary | ICD-10-CM | POA: Diagnosis not present

## 2017-10-01 NOTE — Progress Notes (Signed)
Cardiology Office Note   Date:  10/01/2017   ID:  Frank Hamilton, DOB August 16, 1941, MRN [id number]  PCP:  Roselee Nova, MD  Cardiologist:   Kathlyn Sacramento, MD   Chief Complaint  Patient presents with  . other      History of Present Illness: Frank Hamilton is a 76 y.o. male who was referred by Dr.Shah for evaluation of a cardiac murmur.  He has known history of essential hypertension and hyperlipidemia. He was noted to have a systolic heart murmur during physical exam recently and was referred for evaluation. He is not aware of any previous cardiac history and reports having a negative stress test more than 10 years ago.  He denies chest pain, shortness of breath or palpitations. He is not a smoker.  He does have family history of coronary artery disease.  His mother died at the age of 16 of myocardial infarction and he does have siblings with heart disease.  Past Medical History:  Diagnosis Date  . Hyperlipidemia   . Hypertension     Past Surgical History:  Procedure Laterality Date  . HERNIA REPAIR  2009 and 2010     Current Outpatient Medications  Medication Sig Dispense Refill  . amLODipine (NORVASC) 10 MG tablet Take 1 tablet (10 mg total) by mouth daily. 90 tablet 0  . aspirin 81 MG chewable tablet Chew 81 mg by mouth.    . benazepril (LOTENSIN) 20 MG tablet Take 1 tablet (20 mg total) by mouth daily. 90 tablet 0  . Cholecalciferol (VITAMIN D) 2000 units CAPS Take 1 capsule (2,000 Units total) by mouth daily. 30 capsule   . glucosamine-chondroitin 500-400 MG tablet Take 1 tablet by mouth 3 (three) times daily.    . Omega-3 1000 MG CAPS Take 1 capsule by mouth 2 (two) times daily.     No current facility-administered medications for this visit.     Allergies:   Sulfa antibiotics    Social History:  The patient  reports that he has quit smoking. he has never used smokeless tobacco. He reports that he drinks alcohol. He reports that he does not use drugs.    Family History:  The patient's family history includes Diabetes in his maternal grandfather and sister; Heart disease in his mother and sister; Stroke in his father.    ROS:  Please see the history of present illness.   Otherwise, review of systems are positive for none.   All other systems are reviewed and negative.    PHYSICAL EXAM: VS:  BP (!) 148/62 (BP Location: Right Arm, Patient Position: Sitting, Cuff Size: Normal)   Pulse 67   Ht 5\' 11"  (1.803 m)   Wt 204 lb 12 oz (92.9 kg)   BMI 28.56 kg/m  , BMI Body mass index is 28.56 kg/m. GEN: Well nourished, well developed, in no acute distress  HEENT: normal  Neck: no JVD, carotid bruits, or masses Cardiac: RRR; no  rubs, or gallops,no edema .  1 out of 6 systolic ejection murmur in the aortic area Respiratory:  clear to auscultation bilaterally, normal work of breathing GI: soft, nontender, nondistended, + BS MS: no deformity or atrophy  Skin: warm and dry, no rash Neuro:  Strength and sensation are intact Psych: euthymic mood, full affect   EKG:  EKG is ordered today. The ekg ordered today demonstrates normal sinus rhythm with no significant ST or T wave changes.   Recent Labs: 03/19/2017: Hemoglobin 13.9; Platelets  209; TSH 3.89 06/27/2017: Magnesium 1.9 09/28/2017: ALT 32; BUN 14; Creat 0.81; Potassium 4.6; Sodium 141    Lipid Panel    Component Value Date/Time   CHOL 187 09/28/2017 0859   CHOL 197 08/04/2015 0935   TRIG 395 (H) 09/28/2017 0859   HDL 26 (L) 09/28/2017 0859   HDL 29 (L) 08/04/2015 0935   CHOLHDL 7.2 (H) 09/28/2017 0859   VLDL NOT CALC 06/27/2017 1044   LDLCALC NOT CALC 06/27/2017 1044   LDLCALC 135 (H) 08/04/2015 0935      Wt Readings from Last 3 Encounters:  10/01/17 204 lb 12 oz (92.9 kg)  09/27/17 204 lb 8 oz (92.8 kg)  06/27/17 204 lb 1.6 oz (92.6 kg)      PAD Screen 10/01/2017  Previous PAD dx? No  Previous surgical procedure? Yes  Pain with walking? Yes  Subsides with rest? Yes    Feet/toe relief with dangling? No  Painful, non-healing ulcers? No  Extremities discolored? No      ASSESSMENT AND PLAN:  1.  Cardiac murmur: Likely benign functional murmur or due to aortic sclerosis.  No previous evaluation.  I requested an echocardiogram.  The patient has no symptoms suggestive of angina, heart failure or arrhythmia.  No indication for stress testing.  Continue treatment of risk factors.  2.  Essential hypertension: Blood pressure is reasonably controlled on current medications.   Disposition:   FU with me as needed .  Signed,  Kathlyn Sacramento, MD  10/01/2017 1:44 PM    [place]

## 2017-10-01 NOTE — Patient Instructions (Addendum)
Medication Instructions:  Your physician recommends that you continue on your current medications as directed. Please refer to the Current Medication list given to you today.   Labwork: none  Testing/Procedures: Your physician has requested that you have an echocardiogram. Echocardiography is a painless test that uses sound waves to create images of your heart. It provides your doctor with information about the size and shape of your heart and how well your heart's chambers and valves are working. This procedure takes approximately one hour. There are no restrictions for this procedure.    Follow-Up: Your physician recommends that you schedule a follow-up appointment as needed.    Any Other Special Instructions Will Be Listed Below (If Applicable).     If you need a refill on your cardiac medications before your next appointment, please call your pharmacy.  Echocardiogram An echocardiogram, or echocardiography, uses sound waves (ultrasound) to produce an image of your heart. The echocardiogram is simple, painless, obtained within a short period of time, and offers valuable information to your health care provider. The images from an echocardiogram can provide information such as:  Evidence of coronary artery disease (CAD).  Heart size.  Heart muscle function.  Heart valve function.  Aneurysm detection.  Evidence of a past heart attack.  Fluid buildup around the heart.  Heart muscle thickening.  Assess heart valve function.  Tell a health care provider about:  Any allergies you have.  All medicines you are taking, including vitamins, herbs, eye drops, creams, and over-the-counter medicines.  Any problems you or family members have had with anesthetic medicines.  Any blood disorders you have.  Any surgeries you have had.  Any medical conditions you have.  Whether you are pregnant or may be pregnant. What happens before the procedure? No special preparation is  needed. Eat and drink normally. What happens during the procedure?  In order to produce an image of your heart, gel will be applied to your chest and a wand-like tool (transducer) will be moved over your chest. The gel will help transmit the sound waves from the transducer. The sound waves will harmlessly bounce off your heart to allow the heart images to be captured in real-time motion. These images will then be recorded.  You may need an IV to receive a medicine that improves the quality of the pictures. What happens after the procedure? You may return to your normal schedule including diet, activities, and medicines, unless your health care provider tells you otherwise. This information is not intended to replace advice given to you by your health care provider. Make sure you discuss any questions you have with your health care provider. Document Released: 10/27/2000 Document Revised: 06/17/2016 Document Reviewed: 07/07/2013 Elsevier Interactive Patient Education  2017 [employer] Inc[employer]  

## 2017-11-08 ENCOUNTER — Other Ambulatory Visit: Payer: Self-pay

## 2017-11-08 ENCOUNTER — Ambulatory Visit (INDEPENDENT_AMBULATORY_CARE_PROVIDER_SITE_OTHER): Payer: Medicare Other

## 2017-11-08 DIAGNOSIS — R011 Cardiac murmur, unspecified: Secondary | ICD-10-CM

## 2017-12-28 ENCOUNTER — Encounter: Payer: Self-pay | Admitting: Family Medicine

## 2017-12-28 ENCOUNTER — Ambulatory Visit (INDEPENDENT_AMBULATORY_CARE_PROVIDER_SITE_OTHER): Payer: Medicare Other | Admitting: Family Medicine

## 2017-12-28 VITALS — BP 122/74 | HR 74 | Temp 97.4°F | Resp 14 | Wt 203.6 lb

## 2017-12-28 DIAGNOSIS — I350 Nonrheumatic aortic (valve) stenosis: Secondary | ICD-10-CM | POA: Diagnosis not present

## 2017-12-28 DIAGNOSIS — I10 Essential (primary) hypertension: Secondary | ICD-10-CM

## 2017-12-28 DIAGNOSIS — E782 Mixed hyperlipidemia: Secondary | ICD-10-CM | POA: Diagnosis not present

## 2017-12-28 MED ORDER — BENAZEPRIL HCL 20 MG PO TABS: 20.0000 mg | ORAL_TABLET | Freq: Every day | ORAL | 0 refills | Status: DC

## 2017-12-28 MED ORDER — AMLODIPINE BESYLATE 10 MG PO TABS: 10.0000 mg | ORAL_TABLET | Freq: Every day | ORAL | 0 refills | Status: DC

## 2017-12-28 NOTE — Progress Notes (Signed)
Name: Frank Hamilton   MRN: [id number]    DOB: 08/24/1941   Date:12/28/2017       Progress Note  Subjective  Chief Complaint  Chief Complaint  Patient presents with  . Hypertension  . Hyperlipidemia  . Medication Refill    Hypertension  This is a chronic problem. The problem is unchanged. Pertinent negatives include no blurred vision, chest pain, headaches, orthopnea, palpitations or shortness of breath. Past treatments include ACE inhibitors and calcium channel blockers. There is no history of kidney disease, CAD/MI or CVA.  Hyperlipidemia  This is a chronic problem. The problem is uncontrolled (elevated Triglycerides.). Recent lipid tests were reviewed and are high. Pertinent negatives include no chest pain, leg pain, myalgias or shortness of breath. Treatments tried: Krill oil. Risk factors for coronary artery disease include dyslipidemia.     Past Medical History:  Diagnosis Date  . Hyperlipidemia   . Hypertension     Past Surgical History:  Procedure Laterality Date  . HERNIA REPAIR  2009 and 2010    Family History  Problem Relation Age of Onset  . Heart disease Mother   . Stroke Father   . Heart disease Sister   . Diabetes Sister   . Diabetes Maternal Grandfather     Social History   Socioeconomic History  . Marital status: Married    Spouse name: Not on file  . Number of children: Not on file  . Years of education: Not on file  . Highest education level: Not on file  Social Needs  . Financial resource strain: Not on file  . Food insecurity - worry: Not on file  . Food insecurity - inability: Not on file  . Transportation needs - medical: Not on file  . Transportation needs - non-medical: Not on file  Occupational History  . Not on file  Tobacco Use  . Smoking status: Former Research scientist (life sciences)  . Smokeless tobacco: Never Used  Substance and Sexual Activity  . Alcohol use: Yes    Alcohol/week: 0.0 oz  . Drug use: No  . Sexual activity: Not on file  Other  Topics Concern  . Not on file  Social History Narrative  . Not on file     Current Outpatient Medications:  .  amLODipine (NORVASC) 10 MG tablet, Take 1 tablet (10 mg total) by mouth daily., Disp: 90 tablet, Rfl: 0 .  aspirin 81 MG chewable tablet, Chew 81 mg by mouth., Disp: , Rfl:  .  benazepril (LOTENSIN) 20 MG tablet, Take 1 tablet (20 mg total) by mouth daily., Disp: 90 tablet, Rfl: 0 .  Cholecalciferol (VITAMIN D) 2000 units CAPS, Take 1 capsule (2,000 Units total) by mouth daily., Disp: 30 capsule, Rfl:  .  glucosamine-chondroitin 500-400 MG tablet, Take 1 tablet by mouth 3 (three) times daily., Disp: , Rfl:  .  Omega-3 1000 MG CAPS, Take 1 capsule by mouth 2 (two) times daily., Disp: , Rfl:   Allergies  Allergen Reactions  . Sulfa Antibiotics      Review of Systems  Eyes: Negative for blurred vision.  Respiratory: Negative for shortness of breath.   Cardiovascular: Negative for chest pain, palpitations and orthopnea.  Musculoskeletal: Negative for myalgias.  Neurological: Negative for headaches.    Objective  Vitals:   12/28/17 1019  BP: 122/74  Pulse: 74  Resp: 14  Temp: (!) 97.4 F (36.3 C)  TempSrc: Oral  SpO2: 93%  Weight: 203 lb 9.6 oz (92.4 kg)    Physical Exam  Constitutional: He is oriented to person, place, and time and well-developed, well-nourished, and in no distress.  HENT:  Head: Normocephalic and atraumatic.  Cardiovascular: Normal rate, regular rhythm, S1 normal and S2 normal.  Murmur heard.  Systolic murmur is present with a grade of 2/6. Pulmonary/Chest: Effort normal and breath sounds normal. He has no decreased breath sounds. He has no wheezes. He has no rales.  Abdominal: Soft. Bowel sounds are normal. There is no tenderness.  Neurological: He is alert and oriented to person, place, and time.  Psychiatric: Mood, memory, affect and judgment normal.  Nursing note and vitals reviewed.      Assessment & Plan  1. Essential  hypertension BP stable on present antihypertensive treatment - amLODipine (NORVASC) 10 MG tablet; Take 1 tablet (10 mg total) by mouth daily.  Dispense: 90 tablet; Refill: 0 - benazepril (LOTENSIN) 20 MG tablet; Take 1 tablet (20 mg total) by mouth daily.  Dispense: 90 tablet; Refill: 0  2. Mixed hyperlipidemia Obtain FLP, consider starting on statin - Lipid panel - COMPLETE METABOLIC PANEL WITH GFR  3. Aortic valve stenosis, etiology of cardiac valve disease unspecified Reviewed notes from cardiology and echocardiogram findings, reassured.   Frank Hamilton [facility] 12/28/2017 10:33 AM

## 2018-01-03 DIAGNOSIS — E782 Mixed hyperlipidemia: Secondary | ICD-10-CM | POA: Diagnosis not present

## 2018-01-03 LAB — LIPID PANEL
CHOL/HDL RATIO: 7 (calc) — AB (ref <5.0)
Cholesterol: 188 mg/dL (ref <200)
HDL: 27 mg/dL — AB (ref >40)
NON-HDL CHOLESTEROL (CALC): 161 mg/dL — AB (ref <130)
TRIGLYCERIDES: 579 mg/dL — AB (ref <150)

## 2018-01-03 LAB — COMPLETE METABOLIC PANEL WITH GFR
AG Ratio: 1.6 (calc) (ref 1.0–2.5)
ALKALINE PHOSPHATASE (APISO): 52 U/L (ref 40–115)
ALT: 31 U/L (ref 9–46)
AST: 24 U/L (ref 10–35)
Albumin: 4.1 g/dL (ref 3.6–5.1)
BILIRUBIN TOTAL: 0.5 mg/dL (ref 0.2–1.2)
BUN: 12 mg/dL (ref 7–25)
CHLORIDE: 104 mmol/L (ref 98–110)
CO2: 28 mmol/L (ref 20–32)
Calcium: 9.6 mg/dL (ref 8.6–10.3)
Creat: 0.72 mg/dL (ref 0.70–1.18)
GFR, EST AFRICAN AMERICAN: 107 mL/min/{1.73_m2} (ref >=60)
GFR, Est Non African American: 92 mL/min/{1.73_m2} (ref >=60)
GLUCOSE: 95 mg/dL (ref 65–99)
Globulin: 2.5 g/dL (calc) (ref 1.9–3.7)
POTASSIUM: 4.3 mmol/L (ref 3.5–5.3)
Sodium: 140 mmol/L (ref 135–146)
TOTAL PROTEIN: 6.6 g/dL (ref 6.1–8.1)

## 2018-01-04 ENCOUNTER — Other Ambulatory Visit: Payer: Self-pay | Admitting: Family Medicine

## 2018-01-04 DIAGNOSIS — E785 Hyperlipidemia, unspecified: Secondary | ICD-10-CM

## 2018-01-04 MED ORDER — ATORVASTATIN CALCIUM 40 MG PO TABS
40.0000 mg | ORAL_TABLET | Freq: Every day | ORAL | 0 refills | Status: DC
Start: 2018-01-04 — End: 2018-04-01

## 2018-01-04 MED ORDER — OMEGA-3-ACID ETHYL ESTERS 1 G PO CAPS
2.0000 g | ORAL_CAPSULE | Freq: Two times a day (BID) | ORAL | 1 refills | Status: DC
Start: 2018-01-04 — End: 2018-04-01

## 2018-03-27 DIAGNOSIS — H40003 Preglaucoma, unspecified, bilateral: Secondary | ICD-10-CM | POA: Diagnosis not present

## 2018-04-01 ENCOUNTER — Ambulatory Visit (INDEPENDENT_AMBULATORY_CARE_PROVIDER_SITE_OTHER): Payer: Medicare Other | Admitting: Family Medicine

## 2018-04-01 ENCOUNTER — Encounter: Payer: Self-pay | Admitting: Family Medicine

## 2018-04-01 VITALS — BP 140/60 | HR 71 | Resp 16 | Ht 71.0 in | Wt 203.1 lb

## 2018-04-01 DIAGNOSIS — M8949 Other hypertrophic osteoarthropathy, multiple sites: Secondary | ICD-10-CM

## 2018-04-01 DIAGNOSIS — R29898 Other symptoms and signs involving the musculoskeletal system: Secondary | ICD-10-CM

## 2018-04-01 DIAGNOSIS — E559 Vitamin D deficiency, unspecified: Secondary | ICD-10-CM | POA: Diagnosis not present

## 2018-04-01 DIAGNOSIS — M159 Polyosteoarthritis, unspecified: Secondary | ICD-10-CM

## 2018-04-01 DIAGNOSIS — Z23 Encounter for immunization: Secondary | ICD-10-CM | POA: Diagnosis not present

## 2018-04-01 DIAGNOSIS — E785 Hyperlipidemia, unspecified: Secondary | ICD-10-CM

## 2018-04-01 DIAGNOSIS — D692 Other nonthrombocytopenic purpura: Secondary | ICD-10-CM

## 2018-04-01 DIAGNOSIS — I10 Essential (primary) hypertension: Secondary | ICD-10-CM

## 2018-04-01 DIAGNOSIS — M150 Primary generalized (osteo)arthritis: Secondary | ICD-10-CM

## 2018-04-01 MED ORDER — ATORVASTATIN CALCIUM 40 MG PO TABS: 40.0000 mg | ORAL_TABLET | Freq: Every day | ORAL | 0 refills | Status: DC

## 2018-04-01 MED ORDER — BENAZEPRIL HCL 20 MG PO TABS: 20.0000 mg | ORAL_TABLET | Freq: Every day | ORAL | 0 refills | Status: DC

## 2018-04-01 MED ORDER — OMEGA-3-ACID ETHYL ESTERS 1 G PO CAPS: 2.0000 g | ORAL_CAPSULE | Freq: Two times a day (BID) | ORAL | 1 refills | Status: DC

## 2018-04-01 MED ORDER — AMLODIPINE BESYLATE 10 MG PO TABS: 10.0000 mg | ORAL_TABLET | Freq: Every day | ORAL | 0 refills | Status: DC

## 2018-04-01 NOTE — Progress Notes (Addendum)
Name: Frank Hamilton   MRN: [id number]    DOB: 1941-02-23   Date:04/01/2018       Progress Note  Subjective  Chief Complaint  Chief Complaint  Patient presents with  . Hypertension  . Hyperlipidemia  . Osteoarthritis    HPI  HTN: taking medication and denies side effects, no chest pain or palpitation.   Dyslipidemia: currently only taking otc fish oil, explained that he needs rx fish oil and atorvastatin since triglycerides over 500, and high risk for ASCVD in the next 10 years. He also has senile purpura, both arms. Calculated ASCVD for aspirin use higher than 20 % risk of heart attack without aspirin, but also high bleeding rate of 7.8%. We will treat gerd symptoms   GERD: indigestion at night, taking Tums, usually when eats a heavy meal before bed, he denies a lot of heart burn.   Leg weakness: denies low back pain , he states aching on his legs as soon as he takes a few steps, feels like his legs are weak, no tingling or numbness. Normal balance  OA: mostly on hands taking otc glucosamine, not on nsaid's except at most once a week takes Naproxen when very active   Patient Active Problem List   Diagnosis Date Noted  . Vitamin D deficiency 05/02/2016  . Osteoarthritis of both hands 05/01/2016  . Hypertension, benign 08/03/2015  . Hyperlipidemia 08/03/2015  . Kidney cysts 12/15/2013    Past Surgical History:  Procedure Laterality Date  . HERNIA REPAIR  2009 and 2010    Family History  Problem Relation Age of Onset  . Heart disease Mother   . Stroke Father   . Heart disease Sister   . Diabetes Sister   . Diabetes Maternal Grandfather     Social History   Socioeconomic History  . Marital status: Widowed    Spouse name: Not on file  . Number of children: 3  . Years of education: Not on file  . Highest education level: Not on file  Occupational History  . Not on file  Social Needs  . Financial resource strain: Not on file  . Food insecurity:    Worry: Not on  file    Inability: Not on file  . Transportation needs:    Medical: Not on file    Non-medical: Not on file  Tobacco Use  . Smoking status: Former Smoker    Packs/day: 1.00    Years: 20.00    Pack years: 20.00    Types: Cigarettes    Last attempt to quit: 06/08/1974    Years since quitting: 43.8  . Smokeless tobacco: Never Used  Substance and Sexual Activity  . Alcohol use: Yes    Alcohol/week: 0.6 oz    Types: 1 Cans of beer per week  . Drug use: No  . Sexual activity: Not Currently  Lifestyle  . Physical activity:    Days per week: Not on file    Minutes per session: Not on file  . Stress: Not on file  Relationships  . Social connections:    Talks on phone: Not on file    Gets together: Not on file    Attends religious service: Not on file    Active member of club or organization: Not on file    Attends meetings of clubs or organizations: Not on file    Relationship status: Not on file  . Intimate partner violence:    Fear of current or ex partner: Not  on file    Emotionally abused: Not on file    Physically abused: Not on file    Forced sexual activity: Not on file  Other Topics Concern  . Not on file  Social History Narrative   He was married for 55 years , wife died in 2016/02/29. Lives with is dog     Current Outpatient Medications:  .  amLODipine (NORVASC) 10 MG tablet, Take 1 tablet (10 mg total) by mouth daily., Disp: 90 tablet, Rfl: 0 .  aspirin 81 MG chewable tablet, Chew 81 mg by mouth., Disp: , Rfl:  .  benazepril (LOTENSIN) 20 MG tablet, Take 1 tablet (20 mg total) by mouth daily., Disp: 90 tablet, Rfl: 0 .  Cholecalciferol (VITAMIN D) 2000 units CAPS, Take 1 capsule (2,000 Units total) by mouth daily., Disp: 30 capsule, Rfl:  .  glucosamine-chondroitin 500-400 MG tablet, Take 1 tablet by mouth 3 (three) times daily., Disp: , Rfl:  .  omega-3 acid ethyl esters (LOVAZA) 1 g capsule, Take 2 capsules (2 g total) by mouth 2 (two) times daily., Disp: 360 capsule,  Rfl: 1 .  atorvastatin (LIPITOR) 40 MG tablet, Take 1 tablet (40 mg total) by mouth daily. (Patient not taking: Reported on 04/01/2018), Disp: 90 tablet, Rfl: 0  Allergies  Allergen Reactions  . Sulfa Antibiotics      ROS  Constitutional: Negative for fever or weight change.  Respiratory: Negative for cough and shortness of breath.   Cardiovascular: Negative for chest pain or palpitations.  Gastrointestinal: Negative for abdominal pain, no bowel changes.  Musculoskeletal: Positive  for gait problem and  joint swelling.  Skin: Negative for rash.  Neurological: Negative for dizziness or headache.  No other specific complaints in a complete review of systems (except as listed in HPI above).  Objective  Vitals:   04/01/18 0957  BP: 140/60  Pulse: 71  Resp: 16  SpO2: 95%  Weight: 203 lb 1.6 oz (92.1 kg)  Height: 5\' 11"  (1.803 m)    Body mass index is 28.33 kg/m.  Physical Exam  Constitutional: Patient appears well-developed and well-nourished. Obese  No distress.  HEENT: head atraumatic, normocephalic, pupils equal and reactive to light,  neck supple, throat within normal limits Cardiovascular: Normal rate, regular rhythm and normal heart sounds.  No murmur heard. No BLE edema. Weak distal pulses  Pulmonary/Chest: Effort normal and breath sounds normal. No respiratory distress. Abdominal: Soft.  There is no tenderness. Psychiatric: Patient has a normal mood and affect. behavior is normal. Judgment and thought content normal. Muscular Skeletal: got up with minimal assistance and less than 10 seconds get up and go test. No significant muscle atrophy on legs.   Recent Results (from the past March 01, 2159 hour(s))  Lipid panel     Status: Abnormal   Collection Time: 01/03/18  8:48 AM  Result Value Ref Range   Cholesterol 188 <200 mg/dL   HDL 27 (L) >40 mg/dL   Triglycerides 579 (H) <150 mg/dL   LDL Cholesterol (Calc)  mg/dL (calc)    Comment: . LDL cholesterol not calculated.  Triglyceride levels greater than 400 mg/dL invalidate calculated LDL results. . Reference range: <100 . Desirable range <100 mg/dL for primary prevention;   <70 mg/dL for patients with CHD or diabetic patients  with > or = 2 CHD risk factors. Marland Kitchen LDL-C is now calculated using the Martin-Hopkins  calculation, which is a validated novel method providing  better accuracy than the Friedewald equation in the  estimation of  LDL-C.  Cresenciano Genre et al. Annamaria Helling. 2353;614(43): 2061-2068  (http://education.[employer].com/faq/FAQ164)    Total CHOL/HDL Ratio 7.0 (H) <5.0 (calc)   Non-HDL Cholesterol (Calc) 161 (H) <130 mg/dL (calc)    Comment: For patients with diabetes plus 1 major ASCVD risk  factor, treating to a non-HDL-C goal of <100 mg/dL  (LDL-C of <70 mg/dL) is considered a therapeutic  option.   COMPLETE METABOLIC PANEL WITH GFR     Status: None   Collection Time: 01/03/18  8:48 AM  Result Value Ref Range   Glucose, Bld 95 65 - 99 mg/dL    Comment: .            Fasting reference interval .    BUN 12 7 - 25 mg/dL   Creat 0.72 0.70 - 1.18 mg/dL    Comment: For patients >2 years of age, the reference limit for Creatinine is approximately 13% higher for people identified as African-American. .    GFR, Est Non African American 92 > OR = 60 mL/min/1.45m2   GFR, Est African American 107 > OR = 60 mL/min/1.55m2   BUN/Creatinine Ratio NOT APPLICABLE 6 - 22 (calc)   Sodium 140 135 - 146 mmol/L   Potassium 4.3 3.5 - 5.3 mmol/L   Chloride 104 98 - 110 mmol/L   CO2 28 20 - 32 mmol/L   Calcium 9.6 8.6 - 10.3 mg/dL   Total Protein 6.6 6.1 - 8.1 g/dL   Albumin 4.1 3.6 - 5.1 g/dL   Globulin 2.5 1.9 - 3.7 g/dL (calc)   AG Ratio 1.6 1.0 - 2.5 (calc)   Total Bilirubin 0.5 0.2 - 1.2 mg/dL   Alkaline phosphatase (APISO) 52 40 - 115 U/L   AST 24 10 - 35 U/L   ALT 31 9 - 46 U/L     PHQ2/9: Depression screen [facility] 2/9 12/28/2017 09/27/2017 06/27/2017 03/19/2017 02/14/2017  Decreased Interest 0  0 0 0 0  Down, Depressed, Hopeless 0 0 0 0 0  PHQ - 2 Score 0 0 0 0 0     Fall Risk: Fall Risk  04/01/2018 12/28/2017 09/27/2017 06/27/2017 03/19/2017  Falls in the past year? No No No No No     Functional Status Survey: Is the patient deaf or have difficulty hearing?: No Does the patient have difficulty seeing, even when wearing glasses/contacts?: No Does the patient have difficulty concentrating, remembering, or making decisions?: No Does the patient have difficulty walking or climbing stairs?: No Does the patient have difficulty dressing or bathing?: No Does the patient have difficulty doing errands alone such as visiting a doctor's office or shopping?: No    Assessment & Plan  1. Dyslipidemia  - atorvastatin (LIPITOR) 40 MG tablet; Take 1 tablet (40 mg total) by mouth daily.  Dispense: 90 tablet; Refill: 0 - omega-3 acid ethyl esters (LOVAZA) 1 g capsule; Take 2 capsules (2 g total) by mouth 2 (two) times daily.  Dispense: 360 capsule; Refill: 1  2. Essential hypertension  - benazepril (LOTENSIN) 20 MG tablet; Take 1 tablet (20 mg total) by mouth daily.  Dispense: 90 tablet; Refill: 0 - amLODipine (NORVASC) 10 MG tablet; Take 1 tablet (10 mg total) by mouth daily.  Dispense: 90 tablet; Refill: 0  3. Senile purpura (HCC)  reassurance  4. Vitamin D deficiency   5. Primary osteoarthritis involving multiple joints    6. Leg weakness, bilateral  - Ambulatory referral to Physical Therapy - Ambulatory referral to Vascular Surgery   7. Need for vaccination  for pneumococcus  - Pneumococcal conjugate vaccine 13-valent IM

## 2018-04-01 NOTE — Addendum Note (Signed)
Addended by: Steele Sizer F on: 04/01/2018 11:12 AM   Modules accepted: Orders

## 2018-04-17 ENCOUNTER — Encounter: Payer: Self-pay | Admitting: Physical Therapy

## 2018-04-17 ENCOUNTER — Ambulatory Visit: Payer: Medicare Other | Attending: Family Medicine | Admitting: Physical Therapy

## 2018-04-17 DIAGNOSIS — M6281 Muscle weakness (generalized): Secondary | ICD-10-CM | POA: Diagnosis not present

## 2018-04-17 NOTE — Therapy (Signed)
[facility] [address], [place], [address] Phone: [phone]   Fax:  [phone]  Physical Therapy Treatment  Patient Details  Name: Frank Hamilton MRN: [id number] Date of Birth: 09-20-1941 Referring Provider: Drue Stager MD   Encounter Date: 04/17/2018  PT End of Session - 04/17/18 1003    Visit Number  1    Number of Visits  17    Date for PT Re-Evaluation  06/12/18    PT Start Time  0930    PT Stop Time  1030    PT Time Calculation (min)  60 min    Activity Tolerance  Patient tolerated treatment well    Behavior During Therapy  [facility] for tasks assessed/performed       Past Medical History:  Diagnosis Date  . Hyperlipidemia   . Hypertension     Past Surgical History:  Procedure Laterality Date  . HERNIA REPAIR  2009 and 2010    There were no vitals filed for this visit.  Subjective Assessment - 04/17/18 0934    Subjective  Bilat LE weakness    Pertinent History  Patient is a 77 year old male with bilat LE weakness, that reports he has been noticing LE weakness over the past 5 years that has gotten progressively worse. Patient reports that he has been retired since 2008 and had bilat plantar faciitis that year that he sought injection treatment for. Patient reports that he became "very aware" of this weakness last Nov when he tried to complete a heel raise on bilat feet and could not. Patient reports he is a "very active person" but has slowed down over the past 8 years, in a very gradual progression. Patient reports no pain at any time. Patient reports that his legs feel very tired with any activity. Patient reports he walks 3x/day (about 2/3 of a mile), and completes housework, but is discouraged by how long it takes d/t leg weakness.    Limitations  House hold activities;Lifting;Standing;Walking    How long can you sit comfortably?  unlimited    How long can you stand comfortably?  3-52minutes    How  long can you walk comfortably?  3-8minutes (reports his 2/3 of a mile walk takes 83minutes)    Diagnostic tests  XRay in 2017: Degenerative changes lumbar spine and both hips. No acute bony abnormalities    Patient Stated Goals  Be able to walk futher and complete household chores without feeling tired    Currently in Pain?  No/denies         [facility] PT Assessment - 04/17/18 0001      Assessment   Medical Diagnosis  bilat LE weakness    Referring Provider  Drue Stager MD    Onset Date/Surgical Date  09/17/17    Hand Dominance  Right    Next MD Visit  3 months out    Prior Therapy  no      Balance Screen   Has the patient fallen in the past 6 months  No    Has the patient had a decrease in activity level because of a fear of falling?   No    Is the patient reluctant to leave their home because of a fear of falling?   No      Home Environment   Living Environment  Private residence    Living Arrangements  Alone    Available Help at Discharge  Family  Type of [place] to enter    Entrance Stairs-Number of Steps  5    Entrance Stairs-Rails  Can reach both    Home Layout  One level      Prior Function   Level of Independence  Independent    Vocation  Retired    Leisure  -- Walking dog, hiking, hunting, fishing          ROM Lumbar flexion 25% limited d/t bilat hamstring tightness Lumbar ext 50% limited  Lumbar rotation 50% limited bilat  Lumbar lateral bending 25% bilat Passive hip IR limited nearly 100%, hip flexion limited by glute tightness  Strength Hip flex:4/5 bilat Hip abd 4/5 bilat Hip Ext in prone: 4/5 bilat Hip IR: 4+/5 bilat Hip ER: 4+/5 bilat Knee ext 5/5 bilat Knee flex 5/5 bilat DF: 4+/5 bilat     Special Tests/ Other -Reflexes absent bilat: achilles, patellar, bicep, tricep, brachioradialis reflexes -Finger to nose: normal, no dysdiadochokinesia, able to perform LE and UE reciprocal movement  -Sensation intact bilat to deep  pressure and light touch  -Myotomes: unable to hold bilat knee flex, bilat DF, bilat hip ext for 10sec; but therapist believes this is d/t decreased muscle endurance (-) slump bilat (-) SLR (-) Crossed SLR (+) Elys on L for hip flexor tightness (+) 90/90 bilat (+) FABER bilat (+) FADIR bilat (+) Obers bilat (-) Scour -Tight musculature and trigger points noted at bilat glute max/min/piriformis, and bilat paraspinals -5xSTS 15 sec w/o UE support -Calf raise test able to come up 25% of full ROM  -TUG 12sec  -SLS balance: R 14sec L 5sec -Posture: slight forward head/rounded shoulders, decreased lordosis -Gait: decreased step length bilat, decreased pelvic and trunk rotation, slow cadence, no festination   Ther-Ex -Seated hamstring stretch 4x  30sec holds (HEP) -STS x 10 (HEP) -Seated heel raises with manual resistance x10 (HEP -Education on PT role and there-ex role on pain and strengthening muscles surrounding the spine to increase stability and decrease pain       .  Marland Kitchen                 PT Education - 04/17/18 0949    Education Details  Patient was educated on diagnosis, anatomy and pathology involved, prognosis, role of PT, and was given an HEP, demonstrating exercise with proper form following verbal and tactile cues, and was given a paper hand out to continue exercise at home. Pt was educated on and agreed to plan of care.    Person(s) Educated  Patient    Methods  Explanation;Demonstration;Tactile cues;Verbal cues;Handout    Comprehension  Verbal cues required;Returned demonstration;Verbalized understanding;Tactile cues required       PT Short Term Goals - 04/17/18 1848      PT SHORT TERM GOAL #1   Title  Pt will be independent with HEP in order to improve strength and balance in order to decrease fall risk and improve function.    Time  3    Period  Weeks    Status  New        PT Long Term Goals - 04/17/18 1850      PT LONG TERM GOAL #1   Title   Pt will decrease 5TSTS by at least 3 seconds in order to demonstrate clinically significant improvement in LE strength    Baseline  04/17/18 15sec w/o UE support     Time  8    Period  Weeks    Status  New      PT LONG TERM GOAL #2   Title  Patient will be able to complete the TUG test in 17.7 seconds to demonstrate age matched norms    Baseline  04/17/18 12sec    Time  8    Period  Weeks    Status  New      PT LONG TERM GOAL #3   Title  Patient will perform bilat SLS for 14.2sec bilat to demonstrate age matched norms of reduced fall risk    Baseline  04/17/18: R: 14sec L 5sec    Time  8    Period  Weeks    Status  New      PT LONG TERM GOAL #4   Title  Patient will increase FOTO score to 64 to demonstrate predicted increase in functional mobility to complete ADLs    Baseline  04/17/18: 54    Time  8    Period  Weeks    Status  New      PT LONG TERM GOAL #5   Title  Pt will increase LEFS by at least 9 points in order to demonstrate significant improvement in lower extremity function    Baseline  04/17/18 49    Time  8    Period  Weeks    Status  New            Plan - 04/17/18 1901    Clinical Impression Statement  Patient is a 77 year old male presenting with LE weakness that he reports has progressively been worsening over the past 8 years after his retirement. Patient reports that his wife died a few years ago, which furthered inactivity. Patient with no c/o of pain, impairments of: decreased endurance, decreased strength of bilat PF, decreased balance, gait abnormlities, decreased lordosis with posture, and soft tissue restrictions in posterior chain. Activity limitations in prolonged walking, and house hold chores. Patient unable to participate fully in his hobbies of hunting/fishing, and walking his dog. Patient will benefit from skilled PT to address these impairments to return patient to PLOF.     History and Personal Factors relevant to plan of care:  HTN     Clinical  Presentation  Evolving    Clinical Presentation due to:   2 personal factors/comorbidities, 3 body systems/activity limitations/participation restrictions    Clinical Decision Making  Moderate    Rehab Potential  Good    Clinical Impairments Affecting Rehab Potential  (+) Motivation (-) Sedentary lifestyle, age, other comorbidities, chroncity of sx    PT Frequency  2x / week    PT Duration  8 weeks    PT Treatment/Interventions  ADLs/Self Care Home Management;Therapeutic activities;Electrical Stimulation;Aquatic Therapy;Moist Heat;Traction;Ultrasound;Cryotherapy;Functional mobility training;Stair training;Gait training;Therapeutic exercise;Balance training;Patient/family education;Passive range of motion;Manual techniques;Dry needling;Taping;Neuromuscular re-education    PT Next Visit Plan  HEP review; 6MWT; endurance based LE strengthening    PT Home Exercise Plan  Hamstring stretch, seated heel raise, STS    Consulted and Agree with Plan of Care  Patient       Patient will benefit from skilled therapeutic intervention in order to improve the following deficits and impairments:  Decreased activity tolerance, Decreased strength, Increased fascial restricitons, Impaired flexibility, Postural dysfunction, Improper body mechanics, Impaired tone, Decreased range of motion, Decreased endurance, Decreased balance, Abnormal gait, Decreased mobility, Difficulty walking  Visit Diagnosis: Muscle weakness (generalized)     Problem List Patient Active Problem List   Diagnosis Date  Noted  . Vitamin D deficiency 05/02/2016  . Osteoarthritis of both hands 05/01/2016  . Hypertension, benign 08/03/2015  . Hyperlipidemia 08/03/2015  . Kidney cysts 12/15/2013   Shelton Silvas PT, DPT Shelton Silvas 04/17/2018, 7:12 PM  [facility] [address], [place], [address] Phone: [phone]   Fax:  [phone]  Name: Frank Hamilton MRN: [id number] Date of Birth: 1941-07-25

## 2018-04-23 ENCOUNTER — Encounter (INDEPENDENT_AMBULATORY_CARE_PROVIDER_SITE_OTHER): Payer: Self-pay | Admitting: Vascular Surgery

## 2018-04-23 ENCOUNTER — Ambulatory Visit (INDEPENDENT_AMBULATORY_CARE_PROVIDER_SITE_OTHER): Payer: Medicare Other | Admitting: Vascular Surgery

## 2018-04-23 VITALS — BP 156/70 | HR 58 | Resp 16 | Ht 71.0 in | Wt 206.2 lb

## 2018-04-23 DIAGNOSIS — I739 Peripheral vascular disease, unspecified: Secondary | ICD-10-CM | POA: Diagnosis not present

## 2018-04-23 DIAGNOSIS — E782 Mixed hyperlipidemia: Secondary | ICD-10-CM

## 2018-04-23 DIAGNOSIS — I10 Essential (primary) hypertension: Secondary | ICD-10-CM

## 2018-04-23 NOTE — Assessment & Plan Note (Signed)
blood pressure control important in reducing the progression of atherosclerotic disease. On appropriate oral medications.  

## 2018-04-23 NOTE — Progress Notes (Signed)
Patient ID: [name], male   DOB: 1940/11/26, 77 y.o.   MRN: [id number]  Chief Complaint  Patient presents with  . New Patient (Initial Visit)    ref [facility] for claudication    HPI Frank Hamilton is a 77 y.o. male.  I am asked to see the patient by [facility] for evaluation of claudication.  The patient reports pain and weakness in his legs with activity.  He reports the left leg to be bothered a little more than the right leg.  No open ulcerations or infection.  No fevers or chills.  He does say that once he works through some of the initial cramping, he can walk for a longer period of time.  Sometimes sitting or standing for long periods of time lead to pain going down his legs.  He has had doctors tell him mixed signals on whether or not his circulation was a problem.  He says that some doctors have felt like his circulation was fine, whereas others felt like it was reduced.  One doctor told him it was from lack of use so he has been trying to walk more.  There is no clear inciting event or causative factor that started the symptoms.  Nothing much is really helped either.   Past Medical History:  Diagnosis Date  . Hyperlipidemia   . Hypertension     Past Surgical History:  Procedure Laterality Date  . HERNIA REPAIR  2009 and 2010    Family History  Problem Relation Age of Onset  . Heart disease Mother   . Stroke Father   . Heart disease Sister   . Diabetes Sister   . Diabetes Maternal Grandfather     Social History Social History   Tobacco Use  . Smoking status: Former Smoker    Packs/day: 1.00    Years: 20.00    Pack years: 20.00    Types: Cigarettes    Last attempt to quit: 06/08/1974    Years since quitting: 43.9  . Smokeless tobacco: Never Used  Substance Use Topics  . Alcohol use: Yes    Alcohol/week: 0.6 oz    Types: 1 Cans of beer per week  . Drug use: No     Allergies  Allergen Reactions  . Sulfa Antibiotics     Current Outpatient  Medications  Medication Sig Dispense Refill  . amLODipine (NORVASC) 10 MG tablet Take 1 tablet (10 mg total) by mouth daily. 90 tablet 0  . aspirin 81 MG chewable tablet Chew 81 mg by mouth.    Marland Kitchen atorvastatin (LIPITOR) 40 MG tablet Take 1 tablet (40 mg total) by mouth daily. 90 tablet 0  . benazepril (LOTENSIN) 20 MG tablet Take 1 tablet (20 mg total) by mouth daily. 90 tablet 0  . Cholecalciferol (VITAMIN D) 2000 units CAPS Take 1 capsule (2,000 Units total) by mouth daily. 30 capsule   . glucosamine-chondroitin 500-400 MG tablet Take 1 tablet by mouth 3 (three) times daily.    Marland Kitchen omega-3 acid ethyl esters (LOVAZA) 1 g capsule Take 2 capsules (2 g total) by mouth 2 (two) times daily. 360 capsule 1   No current facility-administered medications for this visit.       REVIEW OF SYSTEMS (Negative unless checked)  Constitutional: [] Weight loss  [] Fever  [] Chills Cardiac: [] Chest pain   [] Chest pressure   [] Palpitations   [] Shortness of breath when laying flat   [] Shortness of breath at rest   [] Shortness of breath  with exertion. Vascular:  [x] Pain in legs with walking   [] Pain in legs at rest   [] Pain in legs when laying flat   [x] Claudication   [] Pain in feet when walking  [] Pain in feet at rest  [] Pain in feet when laying flat   [] History of DVT   [] Phlebitis   [] Swelling in legs   [] Varicose veins   [] Non-healing ulcers Pulmonary:   [] Uses home oxygen   [] Productive cough   [] Hemoptysis   [] Wheeze  [] COPD   [] Asthma Neurologic:  [] Dizziness  [] Blackouts   [] Seizures   [] History of stroke   [] History of TIA  [] Aphasia   [] Temporary blindness   [] Dysphagia   [] Weakness or numbness in arms   [] Weakness or numbness in legs Musculoskeletal:  [x] Arthritis   [] Joint swelling   [] Joint pain   [] Low back pain Hematologic:  [] Easy bruising  [] Easy bleeding   [] Hypercoagulable state   [] Anemic  [] Hepatitis Gastrointestinal:  [] Blood in stool   [] Vomiting blood  [] Gastroesophageal reflux/heartburn    [] Abdominal pain Genitourinary:  [] Chronic kidney disease   [] Difficult urination  [] Frequent urination  [] Burning with urination   [] Hematuria Skin:  [] Rashes   [] Ulcers   [] Wounds Psychological:  [] History of anxiety   []  History of major depression.    Physical Exam BP (!) 156/70 (BP Location: Right Arm)   Pulse (!) 58   Resp 16   Ht 5\' 11"  (1.803 m)   Wt 206 lb 3.2 oz (93.5 kg)   BMI 28.76 kg/m  Gen:  WD/WN, NAD.  Appears younger than stated age Head: [place]/AT, No temporalis wasting.  Ear/Nose/Throat: Hearing grossly intact, nares w/o erythema or drainage, oropharynx w/o Erythema/Exudate Eyes: Conjunctiva clear, sclera non-icteric  Neck: trachea midline.  No JVD.  Pulmonary:  Good air movement, respirations not labored, no use of accessory muscles Cardiac: RRR Vascular:  Vessel Right Left  Radial Palpable Palpable                          PT  1+ palpable  1+ palpable  DP Palpable  1+ palpable   Gastrointestinal: soft, non-tender/non-distended. Musculoskeletal: M/S 5/5 throughout.  Extremities without ischemic changes.  No deformity or atrophy.  No significant lower extremity edema. Neurologic: Sensation grossly intact in extremities.  Symmetrical.  Speech is fluent. Motor exam as listed above. Psychiatric: Judgment intact, Mood & affect appropriate for pt's clinical situation. Dermatologic: No rashes or ulcers noted.  No cellulitis or open wounds.  Radiology No results found.  Labs No results found for this or any previous visit (from the past 2160 hour(s)).  Assessment/Plan:  Hypertension, benign blood pressure control important in reducing the progression of atherosclerotic disease. On appropriate oral medications.   Hyperlipidemia lipid control important in reducing the progression of atherosclerotic disease. Continue statin therapy   Claudication [facility]) The patient describes lower extremity symptoms that are not entirely clear in their etiology.   Certainly, arterial insufficiency could be contributing to the symptoms although his symptoms are not classic.  He does have multiple atherosclerotic risk factors.  I would recommend noninvasive studies for further evaluation of his arterial perfusion.  These will be done at his convenience.  We can see him back following the studies to discuss the results and determine further treatment options.  I have also discussed other options that could be contributing to his lower extremity symptoms such as neurogenic claudication or arthritis.      Leotis Pain 04/23/2018, 11:21  AM   This note was created with Dragon medical transcription system.  Any errors from dictation are unintentional.

## 2018-04-23 NOTE — Assessment & Plan Note (Signed)
The patient describes lower extremity symptoms that are not entirely clear in their etiology.  Certainly, arterial insufficiency could be contributing to the symptoms although his symptoms are not classic.  He does have multiple atherosclerotic risk factors.  I would recommend noninvasive studies for further evaluation of his arterial perfusion.  These will be done at his convenience.  We can see him back following the studies to discuss the results and determine further treatment options.  I have also discussed other options that could be contributing to his lower extremity symptoms such as neurogenic claudication or arthritis.

## 2018-04-23 NOTE — Assessment & Plan Note (Signed)
lipid control important in reducing the progression of atherosclerotic disease. Continue statin therapy  

## 2018-04-23 NOTE — Patient Instructions (Signed)

## 2018-04-24 ENCOUNTER — Other Ambulatory Visit (INDEPENDENT_AMBULATORY_CARE_PROVIDER_SITE_OTHER): Payer: Self-pay

## 2018-04-24 ENCOUNTER — Encounter: Payer: Self-pay | Admitting: Physical Therapy

## 2018-04-24 ENCOUNTER — Ambulatory Visit: Payer: Medicare Other | Admitting: Physical Therapy

## 2018-04-24 DIAGNOSIS — M6281 Muscle weakness (generalized): Secondary | ICD-10-CM

## 2018-04-24 DIAGNOSIS — I739 Peripheral vascular disease, unspecified: Secondary | ICD-10-CM

## 2018-04-24 NOTE — Therapy (Signed)
[place] [facility] [address], [place], [address] Phone: [phone]   Fax:  [phone]  Physical Therapy Treatment  Patient Details  Name: Frank Hamilton MRN: [id number] Date of Birth: 1941-06-14 Referring Provider: Drue Stager MD   Encounter Date: 04/24/2018  PT End of Session - 04/24/18 1058    Visit Number  2    Number of Visits  18    Date for PT Re-Evaluation  06/12/18    PT Start Time  1030    PT Stop Time  1115    PT Time Calculation (min)  45 min    Activity Tolerance  Patient tolerated treatment well    Behavior During Therapy  [facility] for tasks assessed/performed       Past Medical History:  Diagnosis Date  . Hyperlipidemia   . Hypertension     Past Surgical History:  Procedure Laterality Date  . HERNIA REPAIR  2009 and 2010    There were no vitals filed for this visit.  Subjective Assessment - 04/24/18 1043    Subjective  Patient continues to report no pain in bilat LE, only weakness/fatigue with prolonged walking. Patient reports he is completing his HEP with fatigue, but no increased pain. Patient reports he has seen his PCP about possible circulation issues in the LEs and has a follow up to test for LE circulation next month. No questions or concerns at this time.     Pertinent History  Patient is a 77 year old male with bilat LE weakness, that reports he has been noticing LE weakness over the past 5 years that has gotten progressively worse. Patient reports that he has been retired since 2008 and had bilat plantar faciitis that year that he sought injection treatment for. Patient reports that he became "very aware" of this weakness last Nov when he tried to complete a heel raise on bilat feet and could not. Patient reports he is a "very active person" but has slowed down over the past 8 years, in a very gradual progression. Patient reports no pain at any time. Patient reports that his legs feel very  tired with any activity. Patient reports he walks 3x/day (about 2/3 of a mile), and completes housework, but is discouraged by how long it takes d/t leg weakness.    Limitations  House hold activities;Lifting;Standing;Walking    How long can you sit comfortably?  unlimited    How long can you stand comfortably?  3-76minutes    How long can you walk comfortably?  3-89minutes (reports his 2/3 of a mile walk takes 45minutes)    Diagnostic tests  DG hip and lumbar spine 07/12/16: Degenerative changes lumbar spine and both hips. No acute bony abnormality identified    Patient Stated Goals  Be able to walk futher and complete household chores without feeling tired    Currently in Pain?  No/denies       Ther-Ex -6MWT 116ft with patient reporting no pain throughout, only LE fatigue. Patient ambulates with decreased trunk rotation and bilat decreased step length and slow cadence -Hamstring stretch 30sec bilat (HEP review) -Seated heel raise 3x 12 (HEP review) with min cuing for eccentric control  -Mini squat exercise 3x 12 with max VC/TC and demo needed initially for proper form, with min cuing throughout for full hip ext -Sidelying hip abd red tband 3x 10 each side with min VC for eccentric control -Modified child's post on theraball 2x 30sec holds each side  and forward (added to HEP using a table) -Lower lumbar rotations x20                        PT Education - 04/24/18 1057    Education Details  HEP review; exercise form    Person(s) Educated  Patient    Methods  Explanation;Demonstration;Tactile cues;Verbal cues    Comprehension  Verbalized understanding;Returned demonstration;Verbal cues required;Tactile cues required       PT Short Term Goals - 04/17/18 1848      PT SHORT TERM GOAL #1   Title  Pt will be independent with HEP in order to improve strength and balance in order to decrease fall risk and improve function.    Time  3    Period  Weeks    Status  New         PT Long Term Goals - 04/17/18 1850      PT LONG TERM GOAL #1   Title  Pt will decrease 5TSTS by at least 3 seconds in order to demonstrate clinically significant improvement in LE strength    Baseline  04/17/18 15sec w/o UE support     Time  8    Period  Weeks    Status  New      PT LONG TERM GOAL #2   Title  Patient will be able to complete the TUG test in 17.7 seconds to demonstrate age matched norms    Baseline  04/17/18 12sec    Time  8    Period  Weeks    Status  New      PT LONG TERM GOAL #3   Title  Patient will perform bilat SLS for 14.2sec bilat to demonstrate age matched norms of reduced fall risk    Baseline  04/17/18: R: 14sec L 5sec    Time  8    Period  Weeks    Status  New      PT LONG TERM GOAL #4   Title  Patient will increase FOTO score to 64 to demonstrate predicted increase in functional mobility to complete ADLs    Baseline  04/17/18: 54    Time  8    Period  Weeks    Status  New      PT LONG TERM GOAL #5   Title  Pt will increase LEFS by at least 9 points in order to demonstrate significant improvement in lower extremity function    Baseline  04/17/18 49    Time  [place] - 04/24/18 1214    Clinical Impression Statement  Patient is continuing to report no pain with therex and walking, only LE fatigue. PT attempted therex at higher volumes for endurance gains per patient tolerance. As patient is having no LE pain with activity, he does not appear to have circulation deficits, but PT will follow up with pt following PCP follow up to ensure this is ruled out.     Rehab Potential  Good    Clinical Impairments Affecting Rehab Potential  (+) Motivation (-) Sedentary lifestyle, age, other comorbidities, chroncity of sx    PT Frequency  2x / week    PT Duration  8 weeks    PT Treatment/Interventions  ADLs/Self Care Home Management;Therapeutic activities;Electrical Stimulation;Aquatic Therapy;Moist  Heat;Traction;Ultrasound;Cryotherapy;Functional mobility training;Stair training;Gait training;Therapeutic exercise;Balance training;Patient/family education;Passive range  of motion;Manual techniques;Dry needling;Taping;Neuromuscular re-education    PT Next Visit Plan  LE strengthening at endurance range, increasing lumbar ROM    PT Home Exercise Plan  Hamstring stretch, seated heel raise, STS    Consulted and Agree with Plan of Care  Patient       Patient will benefit from skilled therapeutic intervention in order to improve the following deficits and impairments:  Decreased activity tolerance, Decreased strength, Increased fascial restricitons, Impaired flexibility, Postural dysfunction, Improper body mechanics, Impaired tone, Decreased range of motion, Decreased endurance, Decreased balance, Abnormal gait, Decreased mobility, Difficulty walking  Visit Diagnosis: Muscle weakness (generalized)     Problem List Patient Active Problem List   Diagnosis Date Noted  . Claudication ([place]) 04/23/2018  . Vitamin D deficiency 05/02/2016  . Osteoarthritis of both hands 05/01/2016  . Hypertension, benign 08/03/2015  . Hyperlipidemia 08/03/2015  . Kidney cysts 12/15/2013   Shelton Silvas PT, DPT Shelton Silvas 04/24/2018, 12:48 PM  [place] [facility] [address], [place], [address] Phone: [phone]   Fax:  [phone]  Name: Frank Hamilton MRN: [id number] Date of Birth: Mar 15, 1941

## 2018-04-30 ENCOUNTER — Ambulatory Visit: Payer: Medicare Other

## 2018-05-01 ENCOUNTER — Ambulatory Visit: Payer: Medicare Other | Admitting: Physical Therapy

## 2018-05-08 ENCOUNTER — Encounter: Payer: Medicare Other | Admitting: Physical Therapy

## 2018-05-14 ENCOUNTER — Encounter: Payer: Medicare Other | Admitting: Physical Therapy

## 2018-05-15 ENCOUNTER — Other Ambulatory Visit (INDEPENDENT_AMBULATORY_CARE_PROVIDER_SITE_OTHER): Payer: Self-pay | Admitting: Vascular Surgery

## 2018-05-15 DIAGNOSIS — I7090 Unspecified atherosclerosis: Secondary | ICD-10-CM

## 2018-05-15 DIAGNOSIS — I739 Peripheral vascular disease, unspecified: Secondary | ICD-10-CM

## 2018-05-20 ENCOUNTER — Ambulatory Visit
Admission: RE | Admit: 2018-05-20 | Discharge: 2018-05-20 | Disposition: A | Discharge status: Home / Self Care | Payer: Medicare Other | Source: Ambulatory Visit | Attending: Vascular Surgery | Admitting: Vascular Surgery

## 2018-05-20 DIAGNOSIS — I7090 Unspecified atherosclerosis: Secondary | ICD-10-CM | POA: Insufficient documentation

## 2018-05-20 DIAGNOSIS — I739 Peripheral vascular disease, unspecified: Secondary | ICD-10-CM | POA: Insufficient documentation

## 2018-05-20 DIAGNOSIS — I70221 Atherosclerosis of native arteries of extremities with rest pain, right leg: Secondary | ICD-10-CM | POA: Diagnosis not present

## 2018-05-21 ENCOUNTER — Encounter: Payer: Self-pay | Admitting: Physical Therapy

## 2018-05-21 ENCOUNTER — Ambulatory Visit: Payer: Medicare Other | Attending: Family Medicine | Admitting: Physical Therapy

## 2018-05-21 DIAGNOSIS — M6281 Muscle weakness (generalized): Secondary | ICD-10-CM | POA: Diagnosis not present

## 2018-05-21 NOTE — Therapy (Signed)
[place] [facility] [address], [place], [address] Phone: [phone]   Fax:  [phone]  Physical Therapy Treatment  Patient Details  Name: Frank Hamilton MRN: [id number] Date of Birth: [birth year] Referring Provider: Drue Stager MD   Encounter Date: 05/21/2018  PT End of Session - 05/21/18 1124    Visit Number  3    Number of Visits  18    Date for PT Re-Evaluation  06/12/18    PT Start Time  1119    PT Stop Time  1200    PT Time Calculation (min)  41 min    Activity Tolerance  Patient tolerated treatment well    Behavior During Therapy  [facility] for tasks assessed/performed       Past Medical History:  Diagnosis Date  . Hyperlipidemia   . Hypertension     Past Surgical History:  Procedure Laterality Date  . HERNIA REPAIR  2009 and 2010    There were no vitals filed for this visit.  Subjective Assessment - 05/21/18 1123    Subjective  Patient reports that he has had no pain in the LE over the past 3 weeks, only weakness. Patient reports compliance with his HEP with no questions or concerns.     Pertinent History  Patient is a 77 year old male with bilat LE weakness, that reports he has been noticing LE weakness over the past 5 years that has gotten progressively worse. Patient reports that he has been retired since 2008 and had bilat plantar faciitis that year that he sought injection treatment for. Patient reports that he became "very aware" of this weakness last Nov when he tried to complete a heel raise on bilat feet and could not. Patient reports he is a "very active person" but has slowed down over the past 8 years, in a very gradual progression. Patient reports no pain at any time. Patient reports that his legs feel very tired with any activity. Patient reports he walks 3x/day (about 2/3 of a mile), and completes housework, but is discouraged by how long it takes d/t leg weakness.    Limitations  House hold  activities;Lifting;Standing;Walking    How long can you sit comfortably?  unlimited    How long can you stand comfortably?  3-66minutes    How long can you walk comfortably?  3-52minutes (reports his 2/3 of a mile walk takes 9minutes)    Diagnostic tests  DG hip and lumbar spine 07/12/16: Degenerative changes lumbar spine and both hips. No acute bony abnormality identified    Patient Stated Goals  Be able to walk futher and complete household chores without feeling tired          Ther-Ex -Seated heel raise with manual resistance from PT 3x 10 with cuing for eccentric control against resistance  -STS x 10 with cuing for glute contraction with standing phase, with good carry over from HEP -Mini squat 2x 12 with elevated mat table under patient for TC for ROM,  -Bridge 3x 12 with VC for glute contraction without lumbar extension compensation -Nustep L1 LE only 6 min for endurance demand of quads (LE musculature) -Education on muscular endurance training vs. Muscle strength training and rep range for endurance (12-20 reps) and utilizing this rep range for HEP   Manual -Hamstring contract relax 10sec contract 20sec relax 3x each LE  PT Education - 05/21/18 1128    Education Details  Exercise form    Person(s) Educated  Patient    Methods  Explanation;Demonstration;Tactile cues;Verbal cues    Comprehension  Verbalized understanding;Returned demonstration;Verbal cues required;Tactile cues required       PT Short Term Goals - 04/17/18 1848      PT SHORT TERM GOAL #1   Title  Pt will be independent with HEP in order to improve strength and balance in order to decrease fall risk and improve function.    Time  3    Period  Weeks    Status  New        PT Long Term Goals - 04/17/18 1850      PT LONG TERM GOAL #1   Title  Pt will decrease 5TSTS by at least 3 seconds in order to demonstrate clinically significant improvement in LE strength    Baseline   04/17/18 15sec w/o UE support     Time  8    Period  Weeks    Status  New      PT LONG TERM GOAL #2   Title  Patient will be able to complete the TUG test in 17.7 seconds to demonstrate age matched norms    Baseline  04/17/18 12sec    Time  8    Period  Weeks    Status  New      PT LONG TERM GOAL #3   Title  Patient will perform bilat SLS for 14.2sec bilat to demonstrate age matched norms of reduced fall risk    Baseline  04/17/18: R: 14sec L 5sec    Time  8    Period  Weeks    Status  New      PT LONG TERM GOAL #4   Title  Patient will increase FOTO score to 64 to demonstrate predicted increase in functional mobility to complete ADLs    Baseline  04/17/18: 54    Time  8    Period  Weeks    Status  New      PT LONG TERM GOAL #5   Title  Pt will increase LEFS by at least 9 points in order to demonstrate significant improvement in lower extremity function    Baseline  04/17/18 49    Time  8    Period  Weeks    Status  New            Plan - 05/21/18 1157    Clinical Impression Statement  PT led patient through therex with increased LE endurance demand, which patient was able to complete with no increased pain, only muscular fatigue. PT educated patient on rep range to increase muscular endurance vs. strength, and to complete STS and heel raises ( with manual UE resistance) at this range. Patient reports that he enjoys walking "more than exercising" and PT encouraged patient to continue to walk for as long as he can as well. Patient verbalized understanding of all education provided.    Clinical Presentation due to:   increase his HEP     Rehab Potential  Good    Clinical Impairments Affecting Rehab Potential  (+) Motivation (-) Sedentary lifestyle, age, other comorbidities, chroncity of sx    PT Frequency  2x / week    PT Duration  8 weeks    PT Treatment/Interventions  ADLs/Self Care Home Management;Therapeutic activities;Electrical Stimulation;Aquatic Therapy;Moist  Heat;Traction;Ultrasound;Cryotherapy;Functional mobility training;Stair training;Gait training;Therapeutic exercise;Balance training;Patient/family education;Passive range of motion;Manual techniques;Dry  needling;Taping;Neuromuscular re-education    PT Next Visit Plan  LE strengthening at endurance range, increasing lumbar ROM    PT Home Exercise Plan  Hamstring stretch, seated heel raise, STS    Consulted and Agree with Plan of Care  Patient       Patient will benefit from skilled therapeutic intervention in order to improve the following deficits and impairments:  Decreased activity tolerance, Decreased strength, Increased fascial restricitons, Impaired flexibility, Postural dysfunction, Improper body mechanics, Impaired tone, Decreased range of motion, Decreased endurance, Decreased balance, Abnormal gait, Decreased mobility, Difficulty walking  Visit Diagnosis: Muscle weakness (generalized)     Problem List Patient Active Problem List   Diagnosis Date Noted  . Claudication ([place]) 04/23/2018  . Vitamin D deficiency 05/02/2016  . Osteoarthritis of both hands 05/01/2016  . Hypertension, benign 08/03/2015  . Hyperlipidemia 08/03/2015  . Kidney cysts 12/15/2013   Shelton Silvas PT, DPT Shelton Silvas 05/21/2018, 12:32 PM  [place] [facility] [address] [place], [address] Phone: [phone]   Fax:  [phone]  Name: Frank Hamilton MRN: [id number] Date of Birth: [birth year]

## 2018-05-23 ENCOUNTER — Ambulatory Visit: Payer: Medicare Other | Admitting: Physical Therapy

## 2018-05-28 ENCOUNTER — Encounter: Payer: Self-pay | Admitting: Physical Therapy

## 2018-05-28 ENCOUNTER — Ambulatory Visit: Payer: Medicare Other | Admitting: Physical Therapy

## 2018-05-28 DIAGNOSIS — M6281 Muscle weakness (generalized): Secondary | ICD-10-CM | POA: Diagnosis not present

## 2018-05-28 NOTE — Therapy (Signed)
[facility] [address] [place], [address] Phone: [phone]   Fax:  [phone]  Physical Therapy Treatment/Progress Note  Patient Details  Name: Frank Hamilton MRN: [id number] Date of Birth: 02-06-1941 Referring Provider: Drue Stager MD   Encounter Date: 05/28/2018  PT End of Session - 05/28/18 1307    Visit Number  4    Number of Visits  18    Date for PT Re-Evaluation  06/12/18    PT Start Time  0100    PT Stop Time  0145    PT Time Calculation (min)  45 min    Activity Tolerance  Patient tolerated treatment well    Behavior During Therapy  [facility] for tasks assessed/performed       Past Medical History:  Diagnosis Date  . Hyperlipidemia   . Hypertension     Past Surgical History:  Procedure Laterality Date  . HERNIA REPAIR  2009 and 2010    There were no vitals filed for this visit.  Subjective Assessment - 05/28/18 1305    Subjective  Patient continues to report no pain, only LE weakness. Patient reports compliance with his HEP with no questions or concerns.     Pertinent History  Patient is a 77 year old male with bilat LE weakness, that reports he has been noticing LE weakness over the past 5 years that has gotten progressively worse. Patient reports that he has been retired since 2008 and had bilat plantar faciitis that year that he sought injection treatment for. Patient reports that he became "very aware" of this weakness last Nov when he tried to complete a heel raise on bilat feet and could not. Patient reports he is a "very active person" but has slowed down over the past 8 years, in a very gradual progression. Patient reports no pain at any time. Patient reports that his legs feel very tired with any activity. Patient reports he walks 3x/day (about 2/3 of a mile), and completes housework, but is discouraged by how long it takes d/t leg weakness.    Limitations  House hold  activities;Lifting;Standing;Walking    How long can you sit comfortably?  unlimited    How long can you stand comfortably?  3-61minutes    How long can you walk comfortably?  3-75minutes (reports his 2/3 of a mile walk takes 38minutes)    Diagnostic tests  DG hip and lumbar spine 07/12/16: Degenerative changes lumbar spine and both hips. No acute bony abnormality identified    Patient Stated Goals  Be able to walk futher and complete household chores without feeling tired          Ther-Ex -Nustep L1 LE only 7 min for endurance demand of quads (LE musculature) -Bridge 3x 12 with yellow tband resistance at knees to ensure glute med contraction -Side stepping with red tband 3x 63ft (46ft down 60ft back) with cuing for eccentric control  -TUG test for goal reassessment -5x STS test x2 trails with patient demonstrating proper STS form with glute activation -OMEGA leg press 15# x14; 20# x14; 35# 12 with cuing for eccentric control -6MWT as patient previously felt as though he would be unable to complete this, for goal reassessment and LE endurance for functional activity   Manual -Hamstring contract relax 10sec contract 20sec relax 3x each LE  PT Education - 05/28/18 1306    Education Details  Exercise form    Person(s) Educated  Patient    Methods  Explanation;Demonstration;Verbal cues    Comprehension  Verbal cues required;Returned demonstration;Verbalized understanding       PT Short Term Goals - 04/17/18 1848      PT SHORT TERM GOAL #1   Title  Pt will be independent with HEP in order to improve strength and balance in order to decrease fall risk and improve function.    Time  3    Period  Weeks    Status  New        PT Long Term Goals - 05/28/18 1320      PT LONG TERM GOAL #1   Title  Pt will decrease 5TSTS by at least 3 seconds in order to demonstrate clinically significant improvement in LE strength    Baseline  05/28/18 16sec  w/o UE support     Time  8    Period  Weeks    Status  On-going      PT LONG TERM GOAL #2   Title  Patient will be able to complete the TUG test in 17secs to demonstrate clinically significant change    Baseline  05/28/18 11sec    Time  8    Period  Weeks    Status  Achieved      PT LONG TERM GOAL #3   Title  Patient will perform bilat SLS for 14.2sec bilat to demonstrate age matched norms of reduced fall risk    Baseline  04/17/18: R: 14sec L 5sec    Time  8    Period  Weeks    Status  On-going      PT LONG TERM GOAL #4   Title  Patient will increase FOTO score to 64 to demonstrate predicted increase in functional mobility to complete ADLs    Baseline  05/28/18: 58    Time  8    Period  Weeks    Status  On-going      PT LONG TERM GOAL #5   Title  Pt will increase LEFS by at least 9 points in order to demonstrate significant improvement in lower extremity function    Baseline  05/28/18: 52%    Time  8    Period  Weeks    Status  On-going      Additional Long Term Goals   Additional Long Term Goals  Yes      PT LONG TERM GOAL #6   Title  Patient will demonstrate gait speed over 1.15m/s over 51m walk test to demonstrate age matched norms for safe, community ambulation    Baseline  7/16 0.55m/s    Time  8    Period  Weeks    Status  New      PT LONG TERM GOAL #7   Title  Pt will increase 6MWT by at least 19m (153ft) in order to demonstrate clinically significant improvement in muscular endurance and community ambulation    Baseline  05/28/18 1029ft     Time  8    Period  Weeks    Status  New            Plan - 05/28/18 1349    Clinical Impression Statement  PT continued to lead patient through therex with increased endurance demand, which patient was able to complete with no increased pain, only noted fatigue. PT led patient thorugh 6MWT, which patient  was unable to complete prior, and patient was able to complete test without break, with slower gait speed noted as the  test went on. PT will continue to challenge pts muscular endurance to increase LE endurance to safely return to community ambulation and walking that patient completes for hobby.     Rehab Potential  Good    Clinical Impairments Affecting Rehab Potential  (+) Motivation (-) Sedentary lifestyle, age, other comorbidities, chroncity of sx    PT Frequency  2x / week    PT Duration  8 weeks    PT Treatment/Interventions  ADLs/Self Care Home Management;Therapeutic activities;Electrical Stimulation;Aquatic Therapy;Moist Heat;Traction;Ultrasound;Cryotherapy;Functional mobility training;Stair training;Gait training;Therapeutic exercise;Balance training;Patient/family education;Passive range of motion;Manual techniques;Dry needling;Taping;Neuromuscular re-education    PT Next Visit Plan  SLS goal update; LE strengthening at endurance range, increasing lumbar ROM    PT Home Exercise Plan  Hamstring stretch, seated heel raise, STS    Consulted and Agree with Plan of Care  Patient       Patient will benefit from skilled therapeutic intervention in order to improve the following deficits and impairments:  Decreased activity tolerance, Decreased strength, Increased fascial restricitons, Impaired flexibility, Postural dysfunction, Improper body mechanics, Impaired tone, Decreased range of motion, Decreased endurance, Decreased balance, Abnormal gait, Decreased mobility, Difficulty walking  Visit Diagnosis: Muscle weakness (generalized)     Problem List Patient Active Problem List   Diagnosis Date Noted  . Claudication ([place]) 04/23/2018  . Vitamin D deficiency 05/02/2016  . Osteoarthritis of both hands 05/01/2016  . Hypertension, benign 08/03/2015  . Hyperlipidemia 08/03/2015  . Kidney cysts 12/15/2013   Shelton Silvas PT, DPT Shelton Silvas 05/28/2018, 2:00 PM  [facility] [address], [place], [address] Phone: [phone]    Fax:  [phone]  Name: AIDENJAMES HECKMANN MRN: [id number] Date of Birth: Jun 30, 1941

## 2018-05-30 ENCOUNTER — Ambulatory Visit: Payer: Medicare Other | Admitting: Physical Therapy

## 2018-05-30 ENCOUNTER — Encounter: Payer: Self-pay | Admitting: Physical Therapy

## 2018-05-30 DIAGNOSIS — M6281 Muscle weakness (generalized): Secondary | ICD-10-CM

## 2018-05-30 NOTE — Therapy (Signed)
[facility] [address] [place], [address] Phone: [phone]   Fax:  [phone]  Physical Therapy Treatment  Patient Details  Name: Frank Hamilton MRN: [id number] Date of Birth: [birth year] Referring Provider: Drue Stager MD   Encounter Date: 05/30/2018  PT End of Session - 05/30/18 1125    Visit Number  5    Number of Visits  18    Date for PT Re-Evaluation  06/12/18    PT Start Time  1115    PT Stop Time  1200    PT Time Calculation (min)  45 min    Activity Tolerance  Patient tolerated treatment well    Behavior During Therapy  [facility] for tasks assessed/performed       Past Medical History:  Diagnosis Date  . Hyperlipidemia   . Hypertension     Past Surgical History:  Procedure Laterality Date  . HERNIA REPAIR  2009 and 2010    There were no vitals filed for this visit.  Subjective Assessment - 05/30/18 1123    Subjective  Patient continues to report no pain, but reports LE weakness, especially with prolonged walking. Patient reports compliance with his HEP with no questions or concerns.     Pertinent History  Patient is a 77 year old male with bilat LE weakness, that reports he has been noticing LE weakness over the past 5 years that has gotten progressively worse. Patient reports that he has been retired since 2008 and had bilat plantar faciitis that year that he sought injection treatment for. Patient reports that he became "very aware" of this weakness last Nov when he tried to complete a heel raise on bilat feet and could not. Patient reports he is a "very active person" but has slowed down over the past 8 years, in a very gradual progression. Patient reports no pain at any time. Patient reports that his legs feel very tired with any activity. Patient reports he walks 3x/day (about 2/3 of a mile), and completes housework, but is discouraged by how long it takes d/t leg weakness.    Limitations   House hold activities;Lifting;Standing;Walking    How long can you sit comfortably?  unlimited    How long can you stand comfortably?  3-68minutes    How long can you walk comfortably?  3-21minutes (reports his 2/3 of a mile walk takes 45minutes)    Diagnostic tests  DG hip and lumbar spine 07/12/16: Degenerative changes lumbar spine and both hips. No acute bony abnormality identified    Patient Stated Goals  Be able to walk futher and complete household chores without feeling tired         Ther-Ex -Nustep L2 LE only 7 min for endurance demand of quads  -Step Ups 3x 12 (12 with R foot leading/12 with L foot leading) with min cuing for proper form initially with good carry over following. Patient requiring seated rest breaks between sets -Heel raises 25-50% of full ROM 3x 8 with cuing for eccentric control and encouragement that patient is completing exercise "correctly" -OMEGA leg press 3x 12 35# with min cuing for eccentric control initially with good carry over between sets -OMEGA hamstring curl x12 25# increased to 2x 12 35# with min cuing for eccentric control                         PT Education - 05/30/18 1125    Education Details  Exercise form    Person(s) Educated  Patient    Methods  Explanation;Demonstration;Verbal cues    Comprehension  Verbal cues required;Returned demonstration;Verbalized understanding       PT Short Term Goals - 04/17/18 1848      PT SHORT TERM GOAL #1   Title  Pt will be independent with HEP in order to improve strength and balance in order to decrease fall risk and improve function.    Time  3    Period  Weeks    Status  New        PT Long Term Goals - 05/28/18 1320      PT LONG TERM GOAL #1   Title  Pt will decrease 5TSTS by at least 3 seconds in order to demonstrate clinically significant improvement in LE strength    Baseline  05/28/18 16sec w/o UE support     Time  8    Period  Weeks    Status  On-going      PT  LONG TERM GOAL #2   Title  Patient will be able to complete the TUG test in 17secs to demonstrate clinically significant change    Baseline  05/28/18 11sec    Time  8    Period  Weeks    Status  Achieved      PT LONG TERM GOAL #3   Title  Patient will perform bilat SLS for 14.2sec bilat to demonstrate age matched norms of reduced fall risk    Baseline  04/17/18: R: 14sec L 5sec    Time  8    Period  Weeks    Status  On-going      PT LONG TERM GOAL #4   Title  Patient will increase FOTO score to 64 to demonstrate predicted increase in functional mobility to complete ADLs    Baseline  05/28/18: 58    Time  8    Period  Weeks    Status  On-going      PT LONG TERM GOAL #5   Title  Pt will increase LEFS by at least 9 points in order to demonstrate significant improvement in lower extremity function    Baseline  05/28/18: 52%    Time  8    Period  Weeks    Status  On-going      Additional Long Term Goals   Additional Long Term Goals  Yes      PT LONG TERM GOAL #6   Title  Patient will demonstrate gait speed over 1.17m/s over 93m walk test to demonstrate age matched norms for safe, community ambulation    Baseline  7/16 0.31m/s    Time  8    Period  Weeks    Status  New      PT LONG TERM GOAL #7   Title  Pt will increase 6MWT by at least 48m (144ft) in order to demonstrate clinically significant improvement in muscular endurance and community ambulation    Baseline  05/28/18 1070ft     Time  8    Period  Weeks    Status  New            Plan - 05/30/18 1150    Clinical Impression Statement  PT continued to led patient thorugh therex with LE endurance focus. Patient tolerated progression well with no increased pain, only muscle fatigue, requiring prolonged rest breaks with step up exercise. Patient is still demonstrating difficulty with heel raise exercise, which carries over  into his gait pattern (patient ambulates with minimal push off bilat with eversion compensation), but  this is improving. PT encouraged patient to continue HEP to improve this.     Rehab Potential  Good    Clinical Impairments Affecting Rehab Potential  (+) Motivation (-) Sedentary lifestyle, age, other comorbidities, chroncity of sx    PT Frequency  2x / week    PT Duration  8 weeks    PT Treatment/Interventions  ADLs/Self Care Home Management;Therapeutic activities;Electrical Stimulation;Aquatic Therapy;Moist Heat;Traction;Ultrasound;Cryotherapy;Functional mobility training;Stair training;Gait training;Therapeutic exercise;Balance training;Patient/family education;Passive range of motion;Manual techniques;Dry needling;Taping;Neuromuscular re-education    PT Next Visit Plan  SLS goal update; LE strengthening at endurance range, increasing lumbar ROM    PT Home Exercise Plan  Hamstring stretch, seated heel raise, STS    Consulted and Agree with Plan of Care  Patient       Patient will benefit from skilled therapeutic intervention in order to improve the following deficits and impairments:  Decreased activity tolerance, Decreased strength, Increased fascial restricitons, Impaired flexibility, Postural dysfunction, Improper body mechanics, Impaired tone, Decreased range of motion, Decreased endurance, Decreased balance, Abnormal gait, Decreased mobility, Difficulty walking  Visit Diagnosis: Muscle weakness (generalized)     Problem List Patient Active Problem List   Diagnosis Date Noted  . Claudication ([place]) 04/23/2018  . Vitamin D deficiency 05/02/2016  . Osteoarthritis of both hands 05/01/2016  . Hypertension, benign 08/03/2015  . Hyperlipidemia 08/03/2015  . Kidney cysts 12/15/2013   Shelton Silvas PT, DPT Shelton Silvas 05/30/2018, 12:02 PM  [facility] [address], [place], [address] Phone: [phone]   Fax:  [phone]  Name: Frank Hamilton MRN: [id number] Date of Birth: 10/11/1941

## 2018-06-03 ENCOUNTER — Ambulatory Visit: Payer: Medicare Other | Admitting: Physical Therapy

## 2018-06-03 ENCOUNTER — Encounter: Payer: Self-pay | Admitting: Physical Therapy

## 2018-06-03 DIAGNOSIS — M6281 Muscle weakness (generalized): Secondary | ICD-10-CM

## 2018-06-03 NOTE — Therapy (Signed)
[facility] [address] [place], [address] Phone: [phone]   Fax:  [phone]  Physical Therapy Treatment  Patient Details  Name: Frank Hamilton MRN: [id number] Date of Birth: October 10, 1941 Referring Provider: Drue Stager MD   Encounter Date: 06/03/2018  PT End of Session - 06/03/18 1122    Visit Number  6    Number of Visits  18    Date for PT Re-Evaluation  06/12/18    PT Start Time  1115    PT Stop Time  1200    PT Time Calculation (min)  45 min    Activity Tolerance  Patient tolerated treatment well    Behavior During Therapy  [facility] for tasks assessed/performed       Past Medical History:  Diagnosis Date  . Hyperlipidemia   . Hypertension     Past Surgical History:  Procedure Laterality Date  . HERNIA REPAIR  2009 and 2010    There were no vitals filed for this visit.  Subjective Assessment - 06/03/18 1118    Subjective  Patient reports he spent time with family in [place] this weekend, which was enjoyable. Patient reports good tolerance of driving and increased activity with no increased pain. Patient reports he has "tired legs" this am.     Pertinent History  Patient is a 77 year old male with bilat LE weakness, that reports he has been noticing LE weakness over the past 5 years that has gotten progressively worse. Patient reports that he has been retired since 2008 and had bilat plantar faciitis that year that he sought injection treatment for. Patient reports that he became "very aware" of this weakness last Nov when he tried to complete a heel raise on bilat feet and could not. Patient reports he is a "very active person" but has slowed down over the past 8 years, in a very gradual progression. Patient reports no pain at any time. Patient reports that his legs feel very tired with any activity. Patient reports he walks 3x/day (about 2/3 of a mile), and completes housework, but is discouraged by how long  it takes d/t leg weakness.    Limitations  House hold activities;Lifting;Standing;Walking    How long can you sit comfortably?  unlimited    How long can you stand comfortably?  3-78minutes    How long can you walk comfortably?  3-9minutes (reports his 2/3 of a mile walk takes 26minutes)    Diagnostic tests  DG hip and lumbar spine 07/12/16: Degenerative changes lumbar spine and both hips. No acute bony abnormality identified    Patient Stated Goals  Be able to walk futher and complete household chores without feeling tired       Ther-Ex -Nustep L2 LE only 4 min L3 for 32min for endurance demand of quads  -Heel raises on total gym 3x 10 L15 with cuing for 2sec hold at max PF and eccentric control with lowering - STS x10 without UE support; Progressed to mini squat from elevated mat table 2x 10 with min cuing for glute contraction with standing phase -OMEGA knee ext 3x 12 35# with min cuing for eccentric control  -OMEGA hamstring curl 3x 12 35# with min cuing for eccentric control -MATRIX hip abd 40# 2x 12 with min cuing for proper posture and to prevent hip ER  PT Education - 06/03/18 1121    Education Details  Exercise form    Person(s) Educated  Patient    Methods  Explanation;Demonstration;Verbal cues    Comprehension  Verbalized understanding;Returned demonstration;Verbal cues required       PT Short Term Goals - 04/17/18 1848      PT SHORT TERM GOAL #1   Title  Pt will be independent with HEP in order to improve strength and balance in order to decrease fall risk and improve function.    Time  3    Period  Weeks    Status  New        PT Long Term Goals - 05/28/18 1320      PT LONG TERM GOAL #1   Title  Pt will decrease 5TSTS by at least 3 seconds in order to demonstrate clinically significant improvement in LE strength    Baseline  05/28/18 16sec w/o UE support     Time  8    Period  Weeks    Status  On-going      PT LONG  TERM GOAL #2   Title  Patient will be able to complete the TUG test in 17secs to demonstrate clinically significant change    Baseline  05/28/18 11sec    Time  8    Period  Weeks    Status  Achieved      PT LONG TERM GOAL #3   Title  Patient will perform bilat SLS for 14.2sec bilat to demonstrate age matched norms of reduced fall risk    Baseline  04/17/18: R: 14sec L 5sec    Time  8    Period  Weeks    Status  On-going      PT LONG TERM GOAL #4   Title  Patient will increase FOTO score to 64 to demonstrate predicted increase in functional mobility to complete ADLs    Baseline  05/28/18: 58    Time  8    Period  Weeks    Status  On-going      PT LONG TERM GOAL #5   Title  Pt will increase LEFS by at least 9 points in order to demonstrate significant improvement in lower extremity function    Baseline  05/28/18: 52%    Time  8    Period  Weeks    Status  On-going      Additional Long Term Goals   Additional Long Term Goals  Yes      PT LONG TERM GOAL #6   Title  Patient will demonstrate gait speed over 1.79m/s over 41m walk test to demonstrate age matched norms for safe, community ambulation    Baseline  7/16 0.3m/s    Time  8    Period  Weeks    Status  New      PT LONG TERM GOAL #7   Title  Pt will increase 6MWT by at least 79m (180ft) in order to demonstrate clinically significant improvement in muscular endurance and community ambulation    Baseline  05/28/18 1057ft     Time  8    Period  Weeks    Status  New            Plan - 06/03/18 1144    Clinical Impression Statement  PT continued graded exercise approach, which patient continued to tolerate well with noted muscle fatigue and required cuing for proper form/correct muscle activation, which patient was able to comply with by  the end of all sets. PT educated pt on PF weakness and this effect on his gait pattern (eversion compensation). Patient reports he has also noticed this and thiks he has "walked this way for  a long time". PT educated patient that this compensation may have led to his PF having to do less work and becoming weaker over time. Pt understood the importance of PF strengthening to reduce gait compensation and fall risk.     Rehab Potential  Good    Clinical Impairments Affecting Rehab Potential  (+) Motivation (-) Sedentary lifestyle, age, other comorbidities, chroncity of sx    PT Frequency  2x / week    PT Duration  8 weeks    PT Treatment/Interventions  ADLs/Self Care Home Management;Therapeutic activities;Electrical Stimulation;Aquatic Therapy;Moist Heat;Traction;Ultrasound;Cryotherapy;Functional mobility training;Stair training;Gait training;Therapeutic exercise;Balance training;Patient/family education;Passive range of motion;Manual techniques;Dry needling;Taping;Neuromuscular re-education    PT Next Visit Plan  SLS goal update; LE strengthening at endurance range, increasing lumbar ROM    PT Home Exercise Plan  Hamstring stretch, seated heel raise, STS    Consulted and Agree with Plan of Care  Patient       Patient will benefit from skilled therapeutic intervention in order to improve the following deficits and impairments:  Decreased activity tolerance, Decreased strength, Increased fascial restricitons, Impaired flexibility, Postural dysfunction, Improper body mechanics, Impaired tone, Decreased range of motion, Decreased endurance, Decreased balance, Abnormal gait, Decreased mobility, Difficulty walking  Visit Diagnosis: Muscle weakness (generalized)     Problem List Patient Active Problem List   Diagnosis Date Noted  . Claudication ([place]) 04/23/2018  . Vitamin D deficiency 05/02/2016  . Osteoarthritis of both hands 05/01/2016  . Hypertension, benign 08/03/2015  . Hyperlipidemia 08/03/2015  . Kidney cysts 12/15/2013   Shelton Silvas PT, DPT Shelton Silvas 06/03/2018, 12:39 PM   [facility] [address] [place], [address] Phone: [phone]   Fax:  [phone]  Name: Frank Hamilton MRN: [id number] Date of Birth: February 03, 1941

## 2018-06-05 ENCOUNTER — Encounter: Payer: Self-pay | Admitting: Physical Therapy

## 2018-06-05 ENCOUNTER — Ambulatory Visit: Payer: Medicare Other | Admitting: Physical Therapy

## 2018-06-05 DIAGNOSIS — M6281 Muscle weakness (generalized): Secondary | ICD-10-CM | POA: Diagnosis not present

## 2018-06-05 NOTE — Therapy (Signed)
[facility] [address], [place], [address] Phone: [phone]   Fax:  [phone]  Physical Therapy Treatment  Patient Details  Name: Frank Hamilton MRN: [id number] Date of Birth: [birth year] Referring Provider: Drue Stager MD   Encounter Date: 06/05/2018  PT End of Session - 06/05/18 1040    Visit Number  7    Number of Visits  18    Date for PT Re-Evaluation  06/12/18    PT Start Time  1030    PT Stop Time  1115    PT Time Calculation (min)  45 min    Activity Tolerance  Patient tolerated treatment well    Behavior During Therapy  [facility] for tasks assessed/performed       Past Medical History:  Diagnosis Date  . Hyperlipidemia   . Hypertension     Past Surgical History:  Procedure Laterality Date  . HERNIA REPAIR  2009 and 2010    There were no vitals filed for this visit.  Subjective Assessment - 06/05/18 1037    Subjective  Patient reports he is continuing to have no pain. Patient reports he is continuing to increase his walking, and is compliant with his HEP with no questions or concerns.     Pertinent History  Patient is a 77 year old male with bilat LE weakness, that reports he has been noticing LE weakness over the past 5 years that has gotten progressively worse. Patient reports that he has been retired since 2008 and had bilat plantar faciitis that year that he sought injection treatment for. Patient reports that he became "very aware" of this weakness last Nov when he tried to complete a heel raise on bilat feet and could not. Patient reports he is a "very active person" but has slowed down over the past 8 years, in a very gradual progression. Patient reports no pain at any time. Patient reports that his legs feel very tired with any activity. Patient reports he walks 3x/day (about 2/3 of a mile), and completes housework, but is discouraged by how long it takes d/t leg weakness.    Limitations   House hold activities;Lifting;Standing;Walking    How long can you sit comfortably?  unlimited    How long can you stand comfortably?  3-69minutes    How long can you walk comfortably?  3-61minutes (reports his 2/3 of a mile walk takes 45minutes)    Diagnostic tests  DG hip and lumbar spine 07/12/16: Degenerative changes lumbar spine and both hips. No acute bony abnormality identified    Patient Stated Goals  Be able to walk futher and complete household chores without feeling tired         Ther-Ex -Nustep L3 33min for endurance demand of quads  -Mini squats with treadmill bar for balance 1x 12 with min cuing for proper spine alignment; 2x 12 on foam pad with min cuing for eccentric control with lowering -Heel raises on total gym 3x 10 L16 with cuing for eccentric control with lowering -Lateral step down 3x 10 each with 50% lowering to floor with LLE and 25% with RLE min cuing for proper hip/knee/ankle alignment -MATRIX hip abd 40# 3x 12 with min cuing for proper posture and to prevent hip ER -OMEGA leg press 40# 3x 12 with min cuing for eccentric control                       PT Education -  06/05/18 1040    Education Details  Exercise form    Person(s) Educated  Patient    Methods  Explanation;Tactile cues;Verbal cues    Comprehension  Verbalized understanding;Verbal cues required;Tactile cues required       PT Short Term Goals - 04/17/18 1848      PT SHORT TERM GOAL #1   Title  Pt will be independent with HEP in order to improve strength and balance in order to decrease fall risk and improve function.    Time  3    Period  Weeks    Status  New        PT Long Term Goals - 05/28/18 1320      PT LONG TERM GOAL #1   Title  Pt will decrease 5TSTS by at least 3 seconds in order to demonstrate clinically significant improvement in LE strength    Baseline  05/28/18 16sec w/o UE support     Time  8    Period  Weeks    Status  On-going      PT LONG TERM GOAL #2    Title  Patient will be able to complete the TUG test in 17secs to demonstrate clinically significant change    Baseline  05/28/18 11sec    Time  8    Period  Weeks    Status  Achieved      PT LONG TERM GOAL #3   Title  Patient will perform bilat SLS for 14.2sec bilat to demonstrate age matched norms of reduced fall risk    Baseline  04/17/18: R: 14sec L 5sec    Time  8    Period  Weeks    Status  On-going      PT LONG TERM GOAL #4   Title  Patient will increase FOTO score to 64 to demonstrate predicted increase in functional mobility to complete ADLs    Baseline  05/28/18: 58    Time  8    Period  Weeks    Status  On-going      PT LONG TERM GOAL #5   Title  Pt will increase LEFS by at least 9 points in order to demonstrate significant improvement in lower extremity function    Baseline  05/28/18: 52%    Time  8    Period  Weeks    Status  On-going      Additional Long Term Goals   Additional Long Term Goals  Yes      PT LONG TERM GOAL #6   Title  Patient will demonstrate gait speed over 1.51m/s over 80m walk test to demonstrate age matched norms for safe, community ambulation    Baseline  7/16 0.34m/s    Time  8    Period  Weeks    Status  New      PT LONG TERM GOAL #7   Title  Pt will increase 6MWT by at least 22m (167ft) in order to demonstrate clinically significant improvement in muscular endurance and community ambulation    Baseline  05/28/18 1033ft     Time  8    Period  Weeks    Status  New            Plan - 06/05/18 1253    Clinical Impression Statement  PT continued to progress therex, which patient was able to complete correctly with min cuing. Patient reports that with therex progression he can feel that his RLE is "weaker than the L"  and PT observes patient requiring more cuing for proper form with RLE stability. PT explained to patient that these imbalances are more noticable with increased progression, and that PT will continue to address these  assymmetries.     Rehab Potential  Good    Clinical Impairments Affecting Rehab Potential  (+) Motivation (-) Sedentary lifestyle, age, other comorbidities, chroncity of sx    PT Frequency  2x / week    PT Duration  8 weeks    PT Treatment/Interventions  ADLs/Self Care Home Management;Therapeutic activities;Electrical Stimulation;Aquatic Therapy;Moist Heat;Traction;Ultrasound;Cryotherapy;Functional mobility training;Stair training;Gait training;Therapeutic exercise;Balance training;Patient/family education;Passive range of motion;Manual techniques;Dry needling;Taping;Neuromuscular re-education    PT Next Visit Plan  SLS goal update; LE strengthening at endurance range, increasing lumbar ROM    PT Home Exercise Plan  Hamstring stretch, seated heel raise, STS    Consulted and Agree with Plan of Care  Patient       Patient will benefit from skilled therapeutic intervention in order to improve the following deficits and impairments:  Decreased activity tolerance, Decreased strength, Increased fascial restricitons, Impaired flexibility, Postural dysfunction, Improper body mechanics, Impaired tone, Decreased range of motion, Decreased endurance, Decreased balance, Abnormal gait, Decreased mobility, Difficulty walking  Visit Diagnosis: Muscle weakness (generalized)     Problem List Patient Active Problem List   Diagnosis Date Noted  . Claudication ([place]) 04/23/2018  . Vitamin D deficiency 05/02/2016  . Osteoarthritis of both hands 05/01/2016  . Hypertension, benign 08/03/2015  . Hyperlipidemia 08/03/2015  . Kidney cysts 12/15/2013   Shelton Silvas PT, DPT Shelton Silvas 06/05/2018, 1:24 PM  [facility] [address] [place], [address] Phone: [phone]   Fax:  [phone]  Name: Frank Hamilton MRN: [id number] Date of Birth: 06/13/1941

## 2018-06-10 ENCOUNTER — Ambulatory Visit: Payer: Medicare Other | Admitting: Physical Therapy

## 2018-06-10 ENCOUNTER — Encounter: Payer: Self-pay | Admitting: Physical Therapy

## 2018-06-10 DIAGNOSIS — M6281 Muscle weakness (generalized): Secondary | ICD-10-CM | POA: Diagnosis not present

## 2018-06-10 NOTE — Therapy (Signed)
[facility] [address] [place], [address] Phone: [phone]   Fax:  [phone]  Physical Therapy Treatment  Patient Details  Name: Frank Hamilton MRN: [id number] Date of Birth: [birth year] Referring Provider: Drue Stager MD   Encounter Date: 06/10/2018  PT End of Session - 06/10/18 1305    Visit Number  8    Number of Visits  18    Date for PT Re-Evaluation  06/12/18    PT Start Time  0100    PT Stop Time  0145    PT Time Calculation (min)  45 min    Activity Tolerance  Patient tolerated treatment well    Behavior During Therapy  [facility] for tasks assessed/performed       Past Medical History:  Diagnosis Date  . Hyperlipidemia   . Hypertension     Past Surgical History:  Procedure Laterality Date  . HERNIA REPAIR  2009 and 2010    There were no vitals filed for this visit.  Subjective Assessment - 06/10/18 1300    Subjective  Patient reports he is having some soreness in bilat LE. Patient reports he is continuing his walking and his HEP. No questions or concerns at this time.     Pertinent History  Patient is a 77 year old male with bilat LE weakness, that reports he has been noticing LE weakness over the past 5 years that has gotten progressively worse. Patient reports that he has been retired since 2008 and had bilat plantar faciitis that year that he sought injection treatment for. Patient reports that he became "very aware" of this weakness last Nov when he tried to complete a heel raise on bilat feet and could not. Patient reports he is a "very active person" but has slowed down over the past 8 years, in a very gradual progression. Patient reports no pain at any time. Patient reports that his legs feel very tired with any activity. Patient reports he walks 3x/day (about 2/3 of a mile), and completes housework, but is discouraged by how long it takes d/t leg weakness.    Limitations  House hold  activities;Lifting;Standing;Walking    How long can you sit comfortably?  unlimited    How long can you stand comfortably?  3-39minutes    How long can you walk comfortably?  3-35minutes (reports his 2/3 of a mile walk takes 35minutes)    Diagnostic tests  DG hip and lumbar spine 07/12/16: Degenerative changes lumbar spine and both hips. No acute bony abnormality identified    Patient Stated Goals  Be able to walk futher and complete household chores without feeling tired         Ther-Ex -Nustep L4 39minfor endurance demand of quads  -Heel raises on total gym 3x 10 L17 with min cuing for eccentric control -TRX SL squat 3x 8 each side with max cuing initially for proper form with glute contraction and full hip ROM -Lunge exercise 3x 12 with max cuing initially with good carry over noted following -OMEGA leg press 45# 3x 12 with min cuing for eccentric control                        PT Education - 06/10/18 1305    Education Details  Exercise form    Person(s) Educated  Patient    Methods  Explanation;Verbal cues    Comprehension  Verbalized understanding;Verbal cues required  PT Short Term Goals - 04/17/18 1848      PT SHORT TERM GOAL #1   Title  Pt will be independent with HEP in order to improve strength and balance in order to decrease fall risk and improve function.    Time  3    Period  Weeks    Status  New        PT Long Term Goals - 05/28/18 1320      PT LONG TERM GOAL #1   Title  Pt will decrease 5TSTS by at least 3 seconds in order to demonstrate clinically significant improvement in LE strength    Baseline  05/28/18 16sec w/o UE support     Time  8    Period  Weeks    Status  On-going      PT LONG TERM GOAL #2   Title  Patient will be able to complete the TUG test in 17secs to demonstrate clinically significant change    Baseline  05/28/18 11sec    Time  8    Period  Weeks    Status  Achieved      PT LONG TERM GOAL #3   Title  Patient  will perform bilat SLS for 14.2sec bilat to demonstrate age matched norms of reduced fall risk    Baseline  04/17/18: R: 14sec L 5sec    Time  8    Period  Weeks    Status  On-going      PT LONG TERM GOAL #4   Title  Patient will increase FOTO score to 64 to demonstrate predicted increase in functional mobility to complete ADLs    Baseline  05/28/18: 58    Time  8    Period  Weeks    Status  On-going      PT LONG TERM GOAL #5   Title  Pt will increase LEFS by at least 9 points in order to demonstrate significant improvement in lower extremity function    Baseline  05/28/18: 52%    Time  8    Period  Weeks    Status  On-going      Additional Long Term Goals   Additional Long Term Goals  Yes      PT LONG TERM GOAL #6   Title  Patient will demonstrate gait speed over 1.25m/s over 63m walk test to demonstrate age matched norms for safe, community ambulation    Baseline  7/16 0.71m/s    Time  8    Period  Weeks    Status  New      PT LONG TERM GOAL #7   Title  Pt will increase 6MWT by at least 35m (150ft) in order to demonstrate clinically significant improvement in muscular endurance and community ambulation    Baseline  05/28/18 1066ft     Time  8    Period  Weeks    Status  New            Plan - 06/10/18 1313    Clinical Impression Statement  PT continued therex progression with added component of balance in compound stagger and SL stance exercises. Patient with increased muscle fatigue noted, requiring rest breaks between each set, but no noted pain. PT will reassess patient goals/progress next visit.     Rehab Potential  Good    Clinical Impairments Affecting Rehab Potential  (+) Motivation (-) Sedentary lifestyle, age, other comorbidities, chroncity of sx    PT Frequency  2x /  week    PT Duration  8 weeks    PT Treatment/Interventions  ADLs/Self Care Home Management;Therapeutic activities;Electrical Stimulation;Aquatic Therapy;Moist  Heat;Traction;Ultrasound;Cryotherapy;Functional mobility training;Stair training;Gait training;Therapeutic exercise;Balance training;Patient/family education;Passive range of motion;Manual techniques;Dry needling;Taping;Neuromuscular re-education    PT Next Visit Plan  REASSESS/SLS goal update; LE strengthening at endurance range, increasing lumbar ROM    PT Home Exercise Plan  Hamstring stretch, seated heel raise, STS    Consulted and Agree with Plan of Care  Patient       Patient will benefit from skilled therapeutic intervention in order to improve the following deficits and impairments:  Decreased activity tolerance, Decreased strength, Increased fascial restricitons, Impaired flexibility, Postural dysfunction, Improper body mechanics, Impaired tone, Decreased range of motion, Decreased endurance, Decreased balance, Abnormal gait, Decreased mobility, Difficulty walking  Visit Diagnosis: Muscle weakness (generalized)     Problem List Patient Active Problem List   Diagnosis Date Noted  . Claudication ([place]) 04/23/2018  . Vitamin D deficiency 05/02/2016  . Osteoarthritis of both hands 05/01/2016  . Hypertension, benign 08/03/2015  . Hyperlipidemia 08/03/2015  . Kidney cysts 12/15/2013   Shelton Silvas PT, DPT Shelton Silvas 06/10/2018, 1:38 PM  [facility] [address], [place], [address] Phone: [phone]   Fax:  [phone]  Name: Frank Hamilton MRN: [id number] Date of Birth: 09-Aug-1941

## 2018-06-12 ENCOUNTER — Encounter: Payer: Self-pay | Admitting: Physical Therapy

## 2018-06-12 ENCOUNTER — Ambulatory Visit: Payer: Medicare Other | Admitting: Physical Therapy

## 2018-06-12 DIAGNOSIS — M6281 Muscle weakness (generalized): Secondary | ICD-10-CM

## 2018-06-12 NOTE — Therapy (Signed)
[facility] [address] [place], [address] Phone: [phone]   Fax:  [phone]  Physical Therapy Treatment  Patient Details  Name: Frank Hamilton MRN: [id number] Date of Birth: Sep 14, 1941 Referring Provider: Drue Stager MD   Encounter Date: 06/12/2018  PT End of Session - 06/12/18 1351    Visit Number  9    Number of Visits  18    Date for PT Re-Evaluation  06/12/18    PT Start Time  0145    PT Stop Time  0230    PT Time Calculation (min)  45 min    Activity Tolerance  Patient tolerated treatment well    Behavior During Therapy  [facility] for tasks assessed/performed       Past Medical History:  Diagnosis Date  . Hyperlipidemia   . Hypertension     Past Surgical History:  Procedure Laterality Date  . HERNIA REPAIR  2009 and 2010    There were no vitals filed for this visit.  Subjective Assessment - 06/12/18 1349    Subjective  Patient reports some muscle soreness that lasted 24hours following session. Patient reports he feels like his walking is "about the same". Patient reports compliance with HEP with no questions or concerns    Pertinent History  Patient is a 77 year old male with bilat LE weakness, that reports he has been noticing LE weakness over the past 5 years that has gotten progressively worse. Patient reports that he has been retired since 2008 and had bilat plantar faciitis that year that he sought injection treatment for. Patient reports that he became "very aware" of this weakness last Nov when he tried to complete a heel raise on bilat feet and could not. Patient reports he is a "very active person" but has slowed down over the past 8 years, in a very gradual progression. Patient reports no pain at any time. Patient reports that his legs feel very tired with any activity. Patient reports he walks 3x/day (about 2/3 of a mile), and completes housework, but is discouraged by how long it takes d/t  leg weakness.    Limitations  House hold activities;Lifting;Standing;Walking    How long can you sit comfortably?  unlimited    How long can you stand comfortably?  3-56mnutes    How long can you walk comfortably?  3-4101mutes (reports his 2/3 of a mile walk takes 3076mtes)    Diagnostic tests  DG hip and lumbar spine 07/12/16: Degenerative changes lumbar spine and both hips. No acute bony abnormality identified    Patient Stated Goals  Be able to walk futher and complete household chores without feeling tired            Ther-Ex -Nustep L4 5mi57mncreasing to L5 2min86m endurance demand of quads - 5xSTS test over 2 trials with patient demonstrating better form with glute activation and increased, safe speed noted since assessment -SLS trials with patient demonstrating increased stability in SLS on each LE -Heel raises on total gym 3x 10 L18with min cuing for eccentric control -OMEGA knee ext 3x 10 25# with min cuing for eccentric control with good carry over following -SL ball toss at rebounder with 1kg ball 3x 10 each LE with foot down for balance every (avg) 2-3 throws/catches                    PT Education - 06/12/18 1351    Education Details  Exercise  form    Person(s) Educated  Patient    Methods  Explanation;Demonstration;Verbal cues    Comprehension  Verbalized understanding;Verbal cues required;Returned demonstration       PT Short Term Goals - 06/12/18 1352      PT SHORT TERM GOAL #1   Title  Pt will be independent with HEP in order to improve strength and balance in order to decrease fall risk and improve function.    Time  3    Period  Weeks    Status  On-going        PT Long Term Goals - 06/12/18 1353      PT LONG TERM GOAL #1   Title  Pt will decrease 5TSTS by at least 3 seconds in order to demonstrate clinically significant improvement in LE strength    Baseline  06/12/18 14sec    Time  8    Period  Weeks    Status  Not Met      PT  LONG TERM GOAL #2   Title  Patient will be able to complete the TUG test in 17secs to demonstrate clinically significant change    Baseline  05/28/18 11sec    Time  8    Period  Weeks    Status  Achieved      PT LONG TERM GOAL #3   Title  Patient will perform bilat SLS for 45sec bilat to demonstrate age matched norms of reduced fall risk    Baseline  06/12/18 R 45sec L 18sec    Time  8    Period  Weeks    Status  Revised      PT LONG TERM GOAL #4   Title  Patient will increase FOTO score to 64 to demonstrate predicted increase in functional mobility to complete ADLs    Baseline  05/28/18: 58    Time  8    Period  Weeks    Status  On-going      PT LONG TERM GOAL #5   Title  Pt will increase LEFS by at least 9 points in order to demonstrate significant improvement in lower extremity function    Baseline  05/28/18: 52%    Time  8    Period  Weeks    Status  On-going      PT LONG TERM GOAL #6   Title  Patient will demonstrate gait speed over 1.79ms over 162malk test to demonstrate age matched norms for safe, community ambulation    Baseline  7/16 0.9159m   Time  8    Period  Weeks    Status  On-going      PT LONG TERM GOAL #7   Title  Pt will increase 6MWT by at least [id number] order to demonstrate clinically significant improvement in muscular endurance and community ambulation    Baseline  05/28/18 1000ft 49fTime  8    Period  Weeks    Status  On-going            Plan - 06/12/18 1418    Clinical Impression Statement  PT continued to progress therex with balance/stability progression. Patient tolerated all progression well with no increased pain, only noted fatigue. Patient requiring some rest breaks between sets and PT cuing to maintain proper form, with good carry over following. Patient is making gains in LE strength, endurance, and balance, making progress toward all goals. Patient will continue to benefit from skilled PT to  address all goals, and ensure safety  with ADLs/leisure activities.     Rehab Potential  Good    PT Frequency  2x / week    PT Duration  8 weeks    PT Treatment/Interventions  ADLs/Self Care Home Management;Therapeutic activities;Electrical Stimulation;Aquatic Therapy;Moist Heat;Traction;Ultrasound;Cryotherapy;Functional mobility training;Stair training;Gait training;Therapeutic exercise;Balance training;Patient/family education;Passive range of motion;Manual techniques;Dry needling;Taping;Neuromuscular re-education    PT Next Visit Plan  Update HEP; balance, LE strengthening at endurance range, increasing lumbar ROM    PT Home Exercise Plan  Hamstring stretch, seated heel raise, STS    Consulted and Agree with Plan of Care  Patient       Patient will benefit from skilled therapeutic intervention in order to improve the following deficits and impairments:  Decreased activity tolerance, Decreased strength, Increased fascial restricitons, Impaired flexibility, Postural dysfunction, Improper body mechanics, Impaired tone, Decreased range of motion, Decreased endurance, Decreased balance, Abnormal gait, Decreased mobility, Difficulty walking  Visit Diagnosis: Muscle weakness (generalized)     Problem List Patient Active Problem List   Diagnosis Date Noted  . Claudication ([place]) 04/23/2018  . Vitamin D deficiency 05/02/2016  . Osteoarthritis of both hands 05/01/2016  . Hypertension, benign 08/03/2015  . Hyperlipidemia 08/03/2015  . Kidney cysts 12/15/2013   Shelton Silvas PT, DPT Shelton Silvas 06/12/2018, 2:22 PM  [facility] [address], [place], [address] Phone: [phone]   Fax:  [phone]  Name: Frank Hamilton MRN: [id number] Date of Birth: June 17, 1941

## 2018-06-14 ENCOUNTER — Encounter: Payer: Medicare Other | Admitting: Physical Therapy

## 2018-06-18 ENCOUNTER — Ambulatory Visit: Payer: Medicare Other | Attending: Family Medicine | Admitting: Physical Therapy

## 2018-06-18 ENCOUNTER — Encounter: Payer: Self-pay | Admitting: Physical Therapy

## 2018-06-18 DIAGNOSIS — M6281 Muscle weakness (generalized): Secondary | ICD-10-CM | POA: Diagnosis present

## 2018-06-18 NOTE — Therapy (Signed)
[facility] [address] [place], [address] Phone: [phone]   Fax:  [phone]  Physical Therapy Treatment  Patient Details  Name: Frank Hamilton MRN: [id number] Date of Birth: [birth year] Referring Provider: Drue Stager MD   Encounter Date: 06/18/2018  PT End of Session - 06/18/18 1351    Visit Number  10    Number of Visits  30    Date for PT Re-Evaluation  07/24/18    PT Start Time  0145    PT Stop Time  0230    PT Time Calculation (min)  45 min    Activity Tolerance  Patient tolerated treatment well    Behavior During Therapy  [facility] for tasks assessed/performed       Past Medical History:  Diagnosis Date  . Hyperlipidemia   . Hypertension     Past Surgical History:  Procedure Laterality Date  . HERNIA REPAIR  2009 and 2010    There were no vitals filed for this visit.  Subjective Assessment - 06/18/18 1348    Subjective  Patient reports he is continuing walking and his HEP but is noticing little difference. Patient reports he is able to complete his ADLs, but wants them to be "easier" to do. Patient reports he wants to be abe to walk long distances and propel his push mower with more ease. Patient reports he is continuing to have no pain.     Pertinent History  Patient is a 77 year old male with bilat LE weakness, that reports he has been noticing LE weakness over the past 5 years that has gotten progressively worse. Patient reports that he has been retired since 2008 and had bilat plantar faciitis that year that he sought injection treatment for. Patient reports that he became "very aware" of this weakness last Nov when he tried to complete a heel raise on bilat feet and could not. Patient reports he is a "very active person" but has slowed down over the past 8 years, in a very gradual progression. Patient reports no pain at any time. Patient reports that his legs feel very tired with any activity.  Patient reports he walks 3x/day (about 2/3 of a mile), and completes housework, but is discouraged by how long it takes d/t leg weakness.    Limitations  House hold activities;Lifting;Standing;Walking    How long can you sit comfortably?  unlimited    How long can you stand comfortably?  3-51mnutes    How long can you walk comfortably?  3-418mutes (reports his 2/3 of a mile walk takes 3025mtes)    Diagnostic tests  DG hip and lumbar spine 07/12/16: Degenerative changes lumbar spine and both hips. No acute bony abnormality identified    Patient Stated Goals  Be able to walk futher and complete household chores without feeling tired           Ther-Ex -Nustep L57m67mr endurance demand of quads -Heel raises on total gym 3x 10 L18withmin cuing for eccentric control - 6MWT to monitor progress and provide WB endurance  -10MWT with patient demonstrating increased speed with L hip drop - SLS 1kg ball toss at rebounded 3x 10 with increased need for foot down and increased sway with L SLS  - Standing hip abd red tband 3x 10 each with max cuing initially for proper posture and form with good carry over following (HEP) - Demo of SLS in front of counter for HEP  PT Education - 06/18/18 1351    Education Details  exercise form    Person(s) Educated  Patient    Methods  Explanation;Verbal cues    Comprehension  Verbalized understanding;Verbal cues required       PT Short Term Goals - 06/12/18 1352      PT SHORT TERM GOAL #1   Title  Pt will be independent with HEP in order to improve strength and balance in order to decrease fall risk and improve function.    Time  3    Period  Weeks    Status  On-going        PT Long Term Goals - 06/18/18 1355      PT LONG TERM GOAL #1   Title  Pt will decrease 5TSTS by at least 3 seconds in order to demonstrate clinically significant improvement in LE strength    Baseline  06/12/18 14sec    Time  8    Period   Weeks    Status  Not Met      PT LONG TERM GOAL #2   Title  Patient will be able to complete the TUG test in 17secs to demonstrate clinically significant change    Baseline  05/28/18 11sec    Time  8    Period  Weeks    Status  Achieved      PT LONG TERM GOAL #3   Title  Patient will perform bilat SLS for 45sec bilat to demonstrate age matched norms of reduced fall risk    Baseline  06/12/18 R 45sec L 18sec    Time  8    Period  Weeks    Status  Revised      PT LONG TERM GOAL #4   Title  Patient will increase FOTO score to 64 to demonstrate predicted increase in functional mobility to complete ADLs    Baseline  05/28/18: 58    Time  8    Period  Weeks      PT LONG TERM GOAL #5   Title  Pt will increase LEFS by at least 9 points in order to demonstrate significant improvement in lower extremity function    Time  8    Period  Weeks    Status  On-going      PT LONG TERM GOAL #6   Title  Patient will demonstrate gait speed over 1.77ms over 161malk test to demonstrate age matched norms for safe, community ambulation    Baseline  06/18/18 0.9927m   Time  8    Period  Weeks    Status  On-going      PT LONG TERM GOAL #7   Title  Pt will increase 6MWT by at least 61m7m4ft12f order to demonstrate clinically significant improvement in muscular endurance and community ambulation    Baseline  06/18/18 1060ft 88fTime  8    Period  Weeks    Status  On-going            Plan - 06/18/18 1407    Clinical Impression Statement  PT completed walking outcome measures today, to demonstrate progress to patient (also session ran out of time last time for walking test goal updates). Patient is demonstrating progress toward speed and distance goals with ambulation. Patient is demonstrating mild L hip drop, which is consistent with decreased SLS ability on LLE and weakness of LLE compared to R. PT will continue to address impairments to  return patient to PLOF.     Rehab Potential  Good     Clinical Impairments Affecting Rehab Potential  (+) Motivation (-) Sedentary lifestyle, age, other comorbidities, chroncity of sx    PT Frequency  2x / week    PT Duration  8 weeks    PT Treatment/Interventions  ADLs/Self Care Home Management;Therapeutic activities;Electrical Stimulation;Aquatic Therapy;Moist Heat;Traction;Ultrasound;Cryotherapy;Functional mobility training;Stair training;Gait training;Therapeutic exercise;Balance training;Patient/family education;Passive range of motion;Manual techniques;Dry needling;Taping;Neuromuscular re-education    PT Next Visit Plan  Update HEP; balance, LE strengthening at endurance range, increasing lumbar ROM    PT Home Exercise Plan  06/18/18: standing hip abd with red tband, SLS; eval: Hamstring stretch, seated heel raise, STS    Consulted and Agree with Plan of Care  Patient       Patient will benefit from skilled therapeutic intervention in order to improve the following deficits and impairments:  Decreased activity tolerance, Decreased strength, Increased fascial restricitons, Impaired flexibility, Postural dysfunction, Improper body mechanics, Impaired tone, Decreased range of motion, Decreased endurance, Decreased balance, Abnormal gait, Decreased mobility, Difficulty walking  Visit Diagnosis: Muscle weakness (generalized)     Problem List Patient Active Problem List   Diagnosis Date Noted  . Claudication ([place]) 04/23/2018  . Vitamin D deficiency 05/02/2016  . Osteoarthritis of both hands 05/01/2016  . Hypertension, benign 08/03/2015  . Hyperlipidemia 08/03/2015  . Kidney cysts 12/15/2013   Shelton Silvas PT, DPT  Shelton Silvas 06/18/2018, 2:24 PM  [place] [facility] [address] [place], [address] Phone: [phone]   Fax:  [phone]  Name: BREYSON KELM MRN: [id number] Date of Birth: 1941-09-29

## 2018-06-20 ENCOUNTER — Encounter: Payer: Self-pay | Admitting: Physical Therapy

## 2018-06-20 ENCOUNTER — Ambulatory Visit: Payer: Medicare Other | Admitting: Physical Therapy

## 2018-06-20 DIAGNOSIS — M6281 Muscle weakness (generalized): Secondary | ICD-10-CM | POA: Diagnosis not present

## 2018-06-20 NOTE — Therapy (Signed)
[facility] [address] [place], [address] Phone: [phone]   Fax:  [phone]  Physical Therapy Treatment  Patient Details  Name: Frank Hamilton MRN: [id number] Date of Birth: 1941/02/05 Referring Provider: Drue Stager MD   Encounter Date: 06/20/2018  PT End of Session - 06/20/18 1125    Visit Number  11    Number of Visits  30    Date for PT Re-Evaluation  07/24/18    PT Start Time  1115    PT Stop Time  1200    PT Time Calculation (min)  45 min    Activity Tolerance  Patient tolerated treatment well    Behavior During Therapy  [facility] for tasks assessed/performed       Past Medical History:  Diagnosis Date  . Hyperlipidemia   . Hypertension     Past Surgical History:  Procedure Laterality Date  . HERNIA REPAIR  2009 and 2010    There were no vitals filed for this visit.  Subjective Assessment - 06/20/18 1122    Subjective  Patient reports he is continuing walking and his HEP. Patient reports he is continuing to challege his distance walking, but is still taking rest breaks.     Pertinent History  Patient is a 77 year old male with bilat LE weakness, that reports he has been noticing LE weakness over the past 5 years that has gotten progressively worse. Patient reports that he has been retired since 2008 and had bilat plantar faciitis that year that he sought injection treatment for. Patient reports that he became "very aware" of this weakness last Nov when he tried to complete a heel raise on bilat feet and could not. Patient reports he is a "very active person" but has slowed down over the past 8 years, in a very gradual progression. Patient reports no pain at any time. Patient reports that his legs feel very tired with any activity. Patient reports he walks 3x/day (about 2/3 of a mile), and completes housework, but is discouraged by how long it takes d/t leg weakness.    Limitations  House hold  activities;Lifting;Standing;Walking    How long can you sit comfortably?  unlimited    How long can you stand comfortably?  3-63minutes    How long can you walk comfortably?  3-29minutes (reports his 2/3 of a mile walk takes 28minutes)    Diagnostic tests  DG hip and lumbar spine 07/12/16: Degenerative changes lumbar spine and both hips. No acute bony abnormality identified    Patient Stated Goals  Be able to walk futher and complete household chores without feeling tired         Ther-Ex -Nustep L61minfor endurance demand of quads -Heel raises on total gym 3x 10 L18withmin cuing for eccentric control - Sidestepping with red tband 31ft x3 with min cuing for eccentric control - OMEGA leg press 3x 10 49#  - SL stance on foam pad multiple trials with avg of 5 sec holds between trials with education on wt shifting - Education on strengthening PF for better push off with gait and decreased eversion compensation to increase energy efficiency, step length and gait speed with patient demonstrating with 50% improvement in gait following                        PT Education - 06/20/18 1125    Education Details  Exercise form    Person(s) Educated  Patient    Methods  Explanation;Verbal cues;Demonstration    Comprehension  Verbal cues required;Returned demonstration;Verbalized understanding                 Plan - 06/20/18 1151    Clinical Impression Statement  PT continued to progress muscle endurance therex, which patient was able to complete with accuracy following min cuing from PT. PT educated patient on strengthening and it's purpose on gait efficiency, which patient verbalized understanding of and demonstrated gait improvement with following.     Rehab Potential  Good    Clinical Impairments Affecting Rehab Potential  (+) Motivation (-) Sedentary lifestyle, age, other comorbidities, chroncity of sx    PT Frequency  2x / week    PT Duration  8 weeks    PT  Treatment/Interventions  ADLs/Self Care Home Management;Therapeutic activities;Electrical Stimulation;Aquatic Therapy;Moist Heat;Traction;Ultrasound;Cryotherapy;Functional mobility training;Stair training;Gait training;Therapeutic exercise;Balance training;Patient/family education;Passive range of motion;Manual techniques;Dry needling;Taping;Neuromuscular re-education    PT Next Visit Plan   balance, LE strengthening at endurance range, increasing lumbar ROM    PT Home Exercise Plan  06/18/18: standing hip abd with red tband, SLS; eval: Hamstring stretch, seated heel raise, STS    Consulted and Agree with Plan of Care  Patient       Patient will benefit from skilled therapeutic intervention in order to improve the following deficits and impairments:  Decreased activity tolerance, Decreased strength, Increased fascial restricitons, Impaired flexibility, Postural dysfunction, Improper body mechanics, Impaired tone, Decreased range of motion, Decreased endurance, Decreased balance, Abnormal gait, Decreased mobility, Difficulty walking  Visit Diagnosis: Muscle weakness (generalized)     Problem List Patient Active Problem List   Diagnosis Date Noted  . Claudication ([place]) 04/23/2018  . Vitamin D deficiency 05/02/2016  . Osteoarthritis of both hands 05/01/2016  . Hypertension, benign 08/03/2015  . Hyperlipidemia 08/03/2015  . Kidney cysts 12/15/2013   Shelton Silvas PT, DPT Shelton Silvas 06/20/2018, 11:54 AM  [place] [address] [place], [address] Phone: [phone]   Fax:  [phone]  Name: Frank Hamilton MRN: [id number] Date of Birth: 04-28-1941

## 2018-06-26 ENCOUNTER — Encounter: Payer: Self-pay | Admitting: Physical Therapy

## 2018-06-26 ENCOUNTER — Ambulatory Visit: Payer: Medicare Other | Admitting: Physical Therapy

## 2018-06-26 DIAGNOSIS — M6281 Muscle weakness (generalized): Secondary | ICD-10-CM

## 2018-06-26 NOTE — Therapy (Signed)
[place] [address] [place], [address] Phone: [phone]   Fax:  [phone]  Physical Therapy Treatment  Patient Details  Name: Frank Hamilton MRN: [id number] Date of Birth: 19-Mar-1941 Referring Provider: Drue Stager MD   Encounter Date: 06/26/2018  PT End of Session - 06/26/18 1443    Visit Number  12    Number of Visits  30    Date for PT Re-Evaluation  07/24/18    PT Start Time  0230    PT Stop Time  0315    PT Time Calculation (min)  45 min    Activity Tolerance  Patient tolerated treatment well    Behavior During Therapy  [facility] for tasks assessed/performed       Past Medical History:  Diagnosis Date  . Hyperlipidemia   . Hypertension     Past Surgical History:  Procedure Laterality Date  . HERNIA REPAIR  2009 and 2010    There were no vitals filed for this visit.  Subjective Assessment - 06/26/18 1437    Subjective  Patient continues to report no pain, and compliance with his HEP and his walking. Patient continues to report he is challenging himself with the distance he is walking. No questions or concerns.     Pertinent History  Patient is a 77 year old male with bilat LE weakness, that reports he has been noticing LE weakness over the past 5 years that has gotten progressively worse. Patient reports that he has been retired since 2008 and had bilat plantar faciitis that year that he sought injection treatment for. Patient reports that he became "very aware" of this weakness last Nov when he tried to complete a heel raise on bilat feet and could not. Patient reports he is a "very active person" but has slowed down over the past 8 years, in a very gradual progression. Patient reports no pain at any time. Patient reports that his legs feel very tired with any activity. Patient reports he walks 3x/day (about 2/3 of a mile), and completes housework, but is discouraged by how long it takes d/t leg  weakness.    Limitations  House hold activities;Lifting;Standing;Walking    How long can you sit comfortably?  unlimited    How long can you stand comfortably?  3-29mnutes    How long can you walk comfortably?  3-461mutes (reports his 2/3 of a mile walk takes 3036mtes)    Diagnostic tests  DG hip and lumbar spine 07/12/16: Degenerative changes lumbar spine and both hips. No acute bony abnormality identified    Patient Stated Goals  Be able to walk futher and complete household chores without feeling tired       Ther-Ex -Nustep L510m45mr endurance demand of quads -Heel raises on total gym 3x 10 L19withmin cuing for eccentric control - Sidestepping with red tband 20ft42fwith min cuing for eccentric control - OMEGA leg press 3x 10 55# with min cuing for eccentric control - Step ups 3x 10e leading/landing with one LE at a time with cuing for full toe push off (as able) without eversion compensation                      PT Education - 06/26/18 1442    Education Details  Exercise form    Person(s) Educated  Patient    Methods  Explanation;Verbal cues    Comprehension  Verbalized understanding;Verbal cues required  PT Short Term Goals - 06/12/18 1352      PT SHORT TERM GOAL #1   Title  Pt will be independent with HEP in order to improve strength and balance in order to decrease fall risk and improve function.    Time  3    Period  Weeks    Status  On-going        PT Long Term Goals - 06/18/18 1355      PT LONG TERM GOAL #1   Title  Pt will decrease 5TSTS by at least 3 seconds in order to demonstrate clinically significant improvement in LE strength    Baseline  06/12/18 14sec    Time  8    Period  Weeks    Status  Not Met      PT LONG TERM GOAL #2   Title  Patient will be able to complete the TUG test in 17secs to demonstrate clinically significant change    Baseline  05/28/18 11sec    Time  8    Period  Weeks    Status  Achieved      PT LONG  TERM GOAL #3   Title  Patient will perform bilat SLS for 45sec bilat to demonstrate age matched norms of reduced fall risk    Baseline  06/12/18 R 45sec L 18sec    Time  8    Period  Weeks    Status  Revised      PT LONG TERM GOAL #4   Title  Patient will increase FOTO score to 64 to demonstrate predicted increase in functional mobility to complete ADLs    Baseline  05/28/18: 58    Time  8    Period  Weeks      PT LONG TERM GOAL #5   Title  Pt will increase LEFS by at least 9 points in order to demonstrate significant improvement in lower extremity function    Time  8    Period  Weeks    Status  On-going      PT LONG TERM GOAL #6   Title  Patient will demonstrate gait speed over 1.72ms over 151malk test to demonstrate age matched norms for safe, community ambulation    Baseline  06/18/18 0.9975m   Time  8    Period  Weeks    Status  On-going      PT LONG TERM GOAL #7   Title  Pt will increase 6MWT by at least 1m35m4ft87f order to demonstrate clinically significant improvement in muscular endurance and community ambulation    Baseline  06/18/18 1060ft 41fTime  8    Period  Weeks    Status  On-going            Plan - 06/26/18 1454    Clinical Impression Statement  PT continued therex progression for LE endurance, which patient was able to complete with accuracy following PT cuing. Patient is presenting good carry over between sessions of proper form of exercises.     Rehab Potential  Good    Clinical Impairments Affecting Rehab Potential  (+) Motivation (-) Sedentary lifestyle, age, other comorbidities, chroncity of sx    PT Frequency  2x / week    PT Duration  8 weeks    PT Treatment/Interventions  ADLs/Self Care Home Management;Therapeutic activities;Electrical Stimulation;Aquatic Therapy;Moist Heat;Traction;Ultrasound;Cryotherapy;Functional mobility training;Stair training;Gait training;Therapeutic exercise;Balance training;Patient/family education;Passive range of  motion;Manual techniques;Dry needling;Taping;Neuromuscular re-education  PT Next Visit Plan   balance, LE strengthening at endurance range, increasing lumbar ROM    PT Home Exercise Plan  06/18/18: standing hip abd with red tband, SLS; eval: Hamstring stretch, seated heel raise, STS    Consulted and Agree with Plan of Care  Patient       Patient will benefit from skilled therapeutic intervention in order to improve the following deficits and impairments:  Decreased activity tolerance, Decreased strength, Increased fascial restricitons, Impaired flexibility, Postural dysfunction, Improper body mechanics, Impaired tone, Decreased range of motion, Decreased endurance, Decreased balance, Abnormal gait, Decreased mobility, Difficulty walking  Visit Diagnosis: Muscle weakness (generalized)     Problem List Patient Active Problem List   Diagnosis Date Noted  . Claudication ([place]) 04/23/2018  . Vitamin D deficiency 05/02/2016  . Osteoarthritis of both hands 05/01/2016  . Hypertension, benign 08/03/2015  . Hyperlipidemia 08/03/2015  . Kidney cysts 12/15/2013   Shelton Silvas PT, DPT Shelton Silvas 06/26/2018, 3:04 PM  [facility] [address] [place], [address] Phone: [phone]   Fax:  [phone]  Name: DRAYLON MERCADEL MRN: [id number] Date of Birth: 1940/11/14

## 2018-07-02 ENCOUNTER — Encounter: Payer: Self-pay | Admitting: Family Medicine

## 2018-07-02 ENCOUNTER — Ambulatory Visit (INDEPENDENT_AMBULATORY_CARE_PROVIDER_SITE_OTHER): Payer: Medicare Other | Admitting: Family Medicine

## 2018-07-02 VITALS — BP 152/58 | HR 64 | Temp 98.2°F | Resp 16 | Ht 71.0 in | Wt 199.2 lb

## 2018-07-02 DIAGNOSIS — E559 Vitamin D deficiency, unspecified: Secondary | ICD-10-CM | POA: Diagnosis not present

## 2018-07-02 DIAGNOSIS — R29898 Other symptoms and signs involving the musculoskeletal system: Secondary | ICD-10-CM

## 2018-07-02 DIAGNOSIS — M150 Primary generalized (osteo)arthritis: Secondary | ICD-10-CM

## 2018-07-02 DIAGNOSIS — E785 Hyperlipidemia, unspecified: Secondary | ICD-10-CM | POA: Diagnosis not present

## 2018-07-02 DIAGNOSIS — D692 Other nonthrombocytopenic purpura: Secondary | ICD-10-CM

## 2018-07-02 DIAGNOSIS — I739 Peripheral vascular disease, unspecified: Secondary | ICD-10-CM

## 2018-07-02 DIAGNOSIS — M159 Polyosteoarthritis, unspecified: Secondary | ICD-10-CM

## 2018-07-02 DIAGNOSIS — M8949 Other hypertrophic osteoarthropathy, multiple sites: Secondary | ICD-10-CM

## 2018-07-02 DIAGNOSIS — I10 Essential (primary) hypertension: Secondary | ICD-10-CM | POA: Diagnosis not present

## 2018-07-02 DIAGNOSIS — I350 Nonrheumatic aortic (valve) stenosis: Secondary | ICD-10-CM

## 2018-07-02 MED ORDER — ATORVASTATIN CALCIUM 40 MG PO TABS: 40.0000 mg | ORAL_TABLET | Freq: Every day | ORAL | 1 refills | Status: DC

## 2018-07-02 MED ORDER — HYDROCHLOROTHIAZIDE 12.5 MG PO TABS: 12.5000 mg | ORAL_TABLET | Freq: Every day | ORAL | 3 refills | Status: DC

## 2018-07-02 MED ORDER — AMLODIPINE BESYLATE 10 MG PO TABS: 10.0000 mg | ORAL_TABLET | Freq: Every day | ORAL | 1 refills | Status: DC

## 2018-07-02 NOTE — Progress Notes (Signed)
Name: Frank Hamilton   MRN: [id number]    DOB: 12/19/1940   Date:07/02/2018       Progress Note  Subjective  Chief Complaint  Chief Complaint  Patient presents with  . Medication Refill    3 month F/U  . Dyslipidemia  . Gastroesophageal Reflux  . Hypertension    Denies any symptoms  . Osteoarthritis  . Weight Loss    Patient states he has been fuller lately after eating a very small portion-in the past he was told he has a cyst near his kidney and wanted to see if this was a possibility.    HPI  HTN: taking medication and denies side effects, no chest pain or palpitation. BP is high we will add hctz and return in one month, if bp is at goal and not side effects, we will change from benazepril to lisinopril hctz daily   Dyslipidemia: currently only taking otc fish oil, explained that he needs rx fish oil and atorvastatin since triglycerides over 500, and high risk for ASCVD in the next 10 years. He also has senile purpura, both arms. Calculated ASCVD for aspirin use higher than 20 % risk of heart attack without aspirin, he is on statin therapy and we will recheck labs  GERD: indigestion at night, taking Tums, usually when eats a heavy meal before bed, he denies a lot of heart burn. He has noticed some early satiety, he states it started at the same time that he wants to lose weight.  He states he is trying to lose weight and is not worry about weight loss at this time. Last visit was his highest weight in a long time  OA: mostly on hands taking otc glucosamine, not on nsaid's except at most once a week takes Naproxen when very active. No effusion at this time  PVD with claudication: mildly abnormal ABI and having PT still gets weak and pain on lower legs with ambulation but motivated to feel better.   History of left kidney cyst : that went down in size from 2014 to 2015 and per studies stable, no hematuria.    Patient Active Problem List   Diagnosis Date Noted  . PVD  (peripheral vascular disease) with claudication ([place]) 07/02/2018  . Senile purpura ([place]) 07/02/2018  . Claudication ([place]) 04/23/2018  . Vitamin D deficiency 05/02/2016  . Osteoarthritis of both hands 05/01/2016  . Hypertension, benign 08/03/2015  . Hyperlipidemia 08/03/2015  . Kidney cysts 12/15/2013    Past Surgical History:  Procedure Laterality Date  . HERNIA REPAIR  2009 and 2010    Family History  Problem Relation Age of Onset  . Heart disease Mother   . Stroke Father   . Heart disease Sister   . Diabetes Sister   . Diabetes Maternal Grandfather     Social History   Socioeconomic History  . Marital status: Widowed    Spouse name: Not on file  . Number of children: 3  . Years of education: Not on file  . Highest education level: Not on file  Occupational History  . Not on file  Social Needs  . Financial resource strain: Not on file  . Food insecurity:    Worry: Not on file    Inability: Not on file  . Transportation needs:    Medical: Not on file    Non-medical: Not on file  Tobacco Use  . Smoking status: Former Smoker    Packs/day: 1.00    Years: 20.00  Pack years: 20.00    Types: Cigarettes    Last attempt to quit: 06/08/1974    Years since quitting: 44.0  . Smokeless tobacco: Never Used  Substance and Sexual Activity  . Alcohol use: Yes    Alcohol/week: 1.0 standard drinks    Types: 1 Cans of beer per week  . Drug use: No  . Sexual activity: Not Currently  Lifestyle  . Physical activity:    Days per week: Not on file    Minutes per session: Not on file  . Stress: Not on file  Relationships  . Social connections:    Talks on phone: Not on file    Gets together: Not on file    Attends religious service: Not on file    Active member of club or organization: Not on file    Attends meetings of clubs or organizations: Not on file    Relationship status: Not on file  . Intimate partner violence:    Fear of current or ex partner: Not on file     Emotionally abused: Not on file    Physically abused: Not on file    Forced sexual activity: Not on file  Other Topics Concern  . Not on file  Social History Narrative   He was married for 56 years , wife died in February 13, 2016. Lives with is dog     Current Outpatient Medications:  .  amLODipine (NORVASC) 10 MG tablet, Take 1 tablet (10 mg total) by mouth daily., Disp: 90 tablet, Rfl: 1 .  aspirin 81 MG chewable tablet, Chew 81 mg by mouth., Disp: , Rfl:  .  atorvastatin (LIPITOR) 40 MG tablet, Take 1 tablet (40 mg total) by mouth daily., Disp: 90 tablet, Rfl: 1 .  Cholecalciferol (VITAMIN D) 2000 units CAPS, Take 1 capsule (2,000 Units total) by mouth daily., Disp: 30 capsule, Rfl:  .  glucosamine-chondroitin 500-400 MG tablet, Take 1 tablet by mouth 3 (three) times daily., Disp: , Rfl:  .  omega-3 acid ethyl esters (LOVAZA) 1 g capsule, Take 2 capsules (2 g total) by mouth 2 (two) times daily., Disp: 360 capsule, Rfl: 1 .  hydrochlorothiazide (HYDRODIURIL) 12.5 MG tablet, Take 1 tablet (12.5 mg total) by mouth daily., Disp: 90 tablet, Rfl: 3  Allergies  Allergen Reactions  . Sulfa Antibiotics      ROS  Constitutional: Negative for fever , positive for weight change.  Respiratory: Negative for cough and shortness of breath.   Cardiovascular: Negative for chest pain or palpitations.  Gastrointestinal: Negative for abdominal pain, no bowel changes.  Musculoskeletal: Negative for gait problem or joint swelling.  Skin: Negative for rash.  Neurological: Negative for dizziness or headache.  No other specific complaints in a complete review of systems (except as listed in HPI above).  Objective  Vitals:   07/02/18 1110  BP: (!) 152/58  Pulse: 64  Resp: 16  Temp: 98.2 F (36.8 C)  TempSrc: Oral  SpO2: 95%  Weight: 199 lb 3.2 oz (90.4 kg)  Height: 5\' 11"  (1.803 m)    Body mass index is 27.78 kg/m.  Physical Exam  Constitutional: Patient appears well-developed and  well-nourished. Obese No distress.  HEENT: head atraumatic, normocephalic, pupils equal and reactive to light,  neck supple, throat within normal limits Cardiovascular: Normal rate, regular rhythm and normal heart sounds.  No murmur heard. No BLE edema. Pulmonary/Chest: Effort normal and breath sounds normal. No respiratory distress. Abdominal: Soft.  There is no tenderness. Psychiatric: Patient has  a normal mood and affect. behavior is normal. Judgment and thought content normal. Skin: senile purpura   PHQ2/9: Depression screen [facility] 2/9 07/02/2018 12/28/2017 09/27/2017 06/27/2017 03/19/2017  Decreased Interest 0 0 0 0 0  Down, Depressed, Hopeless 0 0 0 0 0  PHQ - 2 Score 0 0 0 0 0     Fall Risk: Fall Risk  07/02/2018 04/01/2018 12/28/2017 09/27/2017 06/27/2017  Falls in the past year? No No No No No     Functional Status Survey: Is the patient deaf or have difficulty hearing?: No Does the patient have difficulty seeing, even when wearing glasses/contacts?: No Does the patient have difficulty concentrating, remembering, or making decisions?: No Does the patient have difficulty walking or climbing stairs?: No Does the patient have difficulty dressing or bathing?: No Does the patient have difficulty doing errands alone such as visiting a doctor's office or shopping?: No    Assessment & Plan  1. Essential hypertension  We will add hctz , continue benazepril that he has at home and next month combine ACE plus HcTZ if bp is at goal  - amLODipine (NORVASC) 10 MG tablet; Take 1 tablet (10 mg total) by mouth daily.  Dispense: 90 tablet; Refill: 1 - hydrochlorothiazide (HYDRODIURIL) 12.5 MG tablet; Take 1 tablet (12.5 mg total) by mouth daily.  Dispense: 90 tablet; Refill: 3 - COMPLETE METABOLIC PANEL WITH GFR - CBC with Differential/Platelet - Urine Microalbumin w/creat. ratio  2. Dyslipidemia  - atorvastatin (LIPITOR) 40 MG tablet; Take 1 tablet (40 mg total) by mouth daily.  Dispense: 90  tablet; Refill: 1 - Lipid panel  3. Vitamin D deficiency  Continue otc supplementatoin   4. Senile purpura (HCC)  Stable   5. Leg weakness, bilateral  Getting PT, likely from PVD   6. Primary osteoarthritis involving multiple joints  Stable, take tylenol instead of nsaid's   7. Aortic valve stenosis, etiology of cardiac valve disease unspecified   8. PVD (peripheral vascular disease) with claudication (HCC)  Continue PT, aspirin and statin therapy

## 2018-07-04 ENCOUNTER — Ambulatory Visit: Payer: Medicare Other | Admitting: Physical Therapy

## 2018-07-08 ENCOUNTER — Ambulatory Visit: Payer: Medicare Other | Admitting: Physical Therapy

## 2018-07-10 ENCOUNTER — Ambulatory Visit: Payer: Medicare Other | Admitting: Physical Therapy

## 2018-07-29 ENCOUNTER — Ambulatory Visit: Payer: Medicare Other | Attending: Family Medicine | Admitting: Physical Therapy

## 2018-07-29 ENCOUNTER — Encounter: Payer: Self-pay | Admitting: Physical Therapy

## 2018-07-29 DIAGNOSIS — M6281 Muscle weakness (generalized): Secondary | ICD-10-CM | POA: Diagnosis not present

## 2018-07-29 NOTE — Therapy (Signed)
[facility] [address], [place], [address] Phone: [phone]   Fax:  [phone]  Physical Therapy Treatment  Patient Details  Name: Frank Hamilton MRN: [id number] Date of Birth: 1941-01-05 Referring Provider: Drue Stager MD   Encounter Date: 07/29/2018    Past Medical History:  Diagnosis Date  . Hyperlipidemia   . Hypertension     Past Surgical History:  Procedure Laterality Date  . HERNIA REPAIR  2009 and 2010    There were no vitals filed for this visit.  Subjective Assessment - 07/29/18 0949    Subjective  Patient reports he is still having weakness in BLE with activity. Patient reports he was completing a lot of walking and yard work when he was gone, but he cannot say that he feels like he was able to do more or less than usual. Patient reports some compliance with HEP.     Pertinent History  Patient is a 77 year old male with bilat LE weakness, that reports he has been noticing LE weakness over the past 5 years that has gotten progressively worse. Patient reports that he has been retired since 2008 and had bilat plantar faciitis that year that he sought injection treatment for. Patient reports that he became "very aware" of this weakness last Nov when he tried to complete a heel raise on bilat feet and could not. Patient reports he is a "very active person" but has slowed down over the past 8 years, in a very gradual progression. Patient reports no pain at any time. Patient reports that his legs feel very tired with any activity. Patient reports he walks 3x/day (about 2/3 of a mile), and completes housework, but is discouraged by how long it takes d/t leg weakness.    Limitations  House hold activities;Lifting;Standing;Walking    How long can you sit comfortably?  unlimited    How long can you stand comfortably?  3-52mnutes    How long can you walk comfortably?  3-487mutes (reports his 2/3 of a mile walk  takes 3066mtes)    Diagnostic tests  DG hip and lumbar spine 07/12/16: Degenerative changes lumbar spine and both hips. No acute bony abnormality identified    Patient Stated Goals  Be able to walk futher and complete household chores without feeling tired           Ther-Ex -Nustep L55m66mr endurance demand of quads - 5x STS w/o UE support 12.32sec; 12.76sec - 6MWT with patient continuing to ambulate with L hip drop over 890ft12fel raises on total gym 3x 10 L17withmin cuing for eccentric control - Gait speed 10MWT over 2 trials - Education on compliance with HEP as patient is demonstrating some set backs in progress following 3 week absence from PT. POC updates discussed with patient                        PT Short Term Goals - 06/12/18 1352      PT SHORT TERM GOAL #1   Title  Pt will be independent with HEP in order to improve strength and balance in order to decrease fall risk and improve function.    Time  3    Period  Weeks    Status  On-going        PT Long Term Goals - 06/18/18 1355      PT LONG TERM GOAL #1   Title  Pt will decrease 5TSTS  by at least 3 seconds in order to demonstrate clinically significant improvement in LE strength    Baseline  06/12/18 14sec    Time  8    Period  Weeks    Status  Not Met      PT LONG TERM GOAL #2   Title  Patient will be able to complete the TUG test in 17secs to demonstrate clinically significant change    Baseline  05/28/18 11sec    Time  8    Period  Weeks    Status  Achieved      PT LONG TERM GOAL #3   Title  Patient will perform bilat SLS for 45sec bilat to demonstrate age matched norms of reduced fall risk    Baseline  06/12/18 R 45sec L 18sec    Time  8    Period  Weeks    Status  Revised      PT LONG TERM GOAL #4   Title  Patient will increase FOTO score to 64 to demonstrate predicted increase in functional mobility to complete ADLs    Baseline  05/28/18: 58    Time  8    Period  Weeks       PT LONG TERM GOAL #5   Title  Pt will increase LEFS by at least 9 points in order to demonstrate significant improvement in lower extremity function    Time  8    Period  Weeks    Status  On-going      PT LONG TERM GOAL #6   Title  Patient will demonstrate gait speed over 1.44ms over 176malk test to demonstrate age matched norms for safe, community ambulation    Baseline  06/18/18 0.9952m   Time  8    Period  Weeks    Status  On-going      PT LONG TERM GOAL #7   Title  Pt will increase 6MWT by at least 76m60m4ft45f order to demonstrate clinically significant improvement in muscular endurance and community ambulation    Baseline  06/18/18 1060ft 4fTime  8    Period  Weeks    Status  On-going              Patient will benefit from skilled therapeutic intervention in order to improve the following deficits and impairments:     Visit Diagnosis: No diagnosis found.     Problem List Patient Active Problem List   Diagnosis Date Noted  . PVD (peripheral vascular disease) with claudication (HCC) 0[place]0/2019  . Senile purpura (HCC) 0[place]0/2019  . Claudication (HCC) 0[place]1/2019  . Vitamin D deficiency 05/02/2016  . Osteoarthritis of both hands 05/01/2016  . Hypertension, benign 08/03/2015  . Hyperlipidemia 08/03/2015  . Kidney cysts 12/15/2013   ChelseShelton SilvasPT ChelseShelton Silvas2019, 9:51 AM  [facility] [address]: [phone]:  [phone]: Frank BARKLEY [id number] Birth: [birth year]

## 2018-07-31 ENCOUNTER — Encounter: Payer: Self-pay | Admitting: Physical Therapy

## 2018-07-31 ENCOUNTER — Ambulatory Visit: Payer: Medicare Other | Admitting: Physical Therapy

## 2018-07-31 DIAGNOSIS — M6281 Muscle weakness (generalized): Secondary | ICD-10-CM

## 2018-07-31 NOTE — Therapy (Signed)
[facility] [address], [place], [address] Phone: [phone]   Fax:  [phone]  Physical Therapy Treatment  Patient Details  Name: Frank Hamilton MRN: [id number] Date of Birth: 1941-06-15 Referring Provider: Drue Stager MD   Encounter Date: 07/31/2018  PT End of Session - 07/31/18 1435    Visit Number  14    Number of Visits  30    Date for PT Re-Evaluation  08/21/18    PT Start Time  0225    PT Stop Time  0303    PT Time Calculation (min)  38 min    Activity Tolerance  Patient tolerated treatment well    Behavior During Therapy  [facility] for tasks assessed/performed       Past Medical History:  Diagnosis Date  . Hyperlipidemia   . Hypertension     Past Surgical History:  Procedure Laterality Date  . HERNIA REPAIR  2009 and 2010    There were no vitals filed for this visit.  Subjective Assessment - 07/31/18 1433    Subjective  Patient reports consistent BLE "giving out with activity" and no LBP over the past week. Patient reports compliance with his HEP with no questions or concerns.     Pertinent History  Patient is a 77 year old male with bilat LE weakness, that reports he has been noticing LE weakness over the past 5 years that has gotten progressively worse. Patient reports that he has been retired since 2008 and had bilat plantar faciitis that year that he sought injection treatment for. Patient reports that he became "very aware" of this weakness last Nov when he tried to complete a heel raise on bilat feet and could not. Patient reports he is a "very active person" but has slowed down over the past 8 years, in a very gradual progression. Patient reports no pain at any time. Patient reports that his legs feel very tired with any activity. Patient reports he walks 3x/day (about 2/3 of a mile), and completes housework, but is discouraged by how long it takes d/t leg weakness.    Limitations  House hold  activities;Lifting;Standing;Walking    How long can you sit comfortably?  unlimited    How long can you stand comfortably?  3-32mnutes    How long can you walk comfortably?  3-472mutes (reports his 2/3 of a mile walk takes 3063mtes)    Diagnostic tests  DG hip and lumbar spine 07/12/16: Degenerative changes lumbar spine and both hips. No acute bony abnormality identified    Patient Stated Goals  Be able to walk futher and complete household chores without feeling tired          Ther-Ex - Nustep L5 for 73m65mor increased endurance demand of quads - Total gym heel raises x10 L19.5; 2x 10 L20 with cuin for full ROM - SL stance trials standing on level surface with best time LLE: 31sec RLE: 13sec and on airex pad with best time LLE: RLE:   - Step ups 3x 12e leading/landing with one LE at a time with cuing for full toe push off (as able) without eversion compensation; patient with fatigue following sets requiring rest breaks                    PT Education - 07/31/18 1434    Education Details  Exercise form    Person(s) Educated  Patient    Methods  Explanation;Demonstration;Verbal cues  Comprehension  Verbalized understanding;Returned demonstration;Verbal cues required       PT Short Term Goals - 06/12/18 1352      PT SHORT TERM GOAL #1   Title  Pt will be independent with HEP in order to improve strength and balance in order to decrease fall risk and improve function.    Time  3    Period  Weeks    Status  On-going        PT Long Term Goals - 07/29/18 0953      PT LONG TERM GOAL #1   Title  Pt will decrease 5TSTS by at least 3 seconds in order to demonstrate clinically significant improvement in LE strength    Baseline  07/29/18 12.32    Time  4    Period  Weeks    Status  On-going      PT LONG TERM GOAL #2   Title  Patient will be able to complete the TUG test in 17secs to demonstrate clinically significant change    Baseline  05/28/18 11sec    Time  4     Period  Weeks    Status  Achieved      PT LONG TERM GOAL #3   Title  Patient will perform bilat SLS for 45sec bilat to demonstrate age matched norms of reduced fall risk    Baseline  07/29/18 RLE 11sec;  LLE 16sec    Time  4    Period  Weeks    Status  On-going      PT LONG TERM GOAL #4   Title  Patient will increase FOTO score to 64 to demonstrate predicted increase in functional mobility to complete ADLs    Baseline  07/29/18    Time  4    Period  Weeks    Status  On-going      PT LONG TERM GOAL #5   Title  Pt will increase LEFS by at least 9 points in order to demonstrate significant improvement in lower extremity function    Baseline  07/29/18    Time  4    Period  Weeks    Status  On-going      PT LONG TERM GOAL #6   Title  Patient will demonstrate gait speed over 1.45ms over 137malk test to demonstrate age matched norms for safe, community ambulation    Baseline  07/29/18 0.9960m   Time  4    Period  Weeks    Status  On-going      PT LONG TERM GOAL #7   Title  Pt will increase 6MWT by at least 66m23m4ft37f order to demonstrate clinically significant improvement in muscular endurance and community ambulation    Baseline  07/29/18 860ft 66fime  4    Period  Weeks    Status  Not Met            Plan - 07/31/18 1504    Clinical Impression Statement  PT continued to progress strengthening for LE endurance which patient is able to complete with min cuing from PT and with rest breaks between sets, as patient is exhibiting fatigue between sets.     Rehab Potential  Good    Clinical Impairments Affecting Rehab Potential  (+) Motivation (-) Sedentary lifestyle, age, other comorbidities, chroncity of sx    PT Frequency  2x / week    PT Duration  8 weeks    PT Treatment/Interventions  ADLs/Self Care Home Management;Therapeutic activities;Electrical Stimulation;Aquatic Therapy;Moist Heat;Traction;Ultrasound;Cryotherapy;Functional mobility training;Stair training;Gait  training;Therapeutic exercise;Balance training;Patient/family education;Passive range of motion;Manual techniques;Dry needling;Taping;Neuromuscular re-education    PT Next Visit Plan   balance, LE strengthening at endurance range, increasing lumbar ROM    PT Home Exercise Plan  06/18/18: standing hip abd with red tband, SLS; eval: Hamstring stretch, seated heel raise, STS    Consulted and Agree with Plan of Care  Patient       Patient will benefit from skilled therapeutic intervention in order to improve the following deficits and impairments:  Decreased activity tolerance, Decreased strength, Increased fascial restricitons, Impaired flexibility, Postural dysfunction, Improper body mechanics, Impaired tone, Decreased range of motion, Decreased endurance, Decreased balance, Abnormal gait, Decreased mobility, Difficulty walking  Visit Diagnosis: Muscle weakness (generalized)     Problem List Patient Active Problem List   Diagnosis Date Noted  . PVD (peripheral vascular disease) with claudication ([place]) 07/02/2018  . Senile purpura ([place]) 07/02/2018  . Claudication ([place]) 04/23/2018  . Vitamin D deficiency 05/02/2016  . Osteoarthritis of both hands 05/01/2016  . Hypertension, benign 08/03/2015  . Hyperlipidemia 08/03/2015  . Kidney cysts 12/15/2013   Shelton Silvas PT, DPT Shelton Silvas 07/31/2018, 3:09 PM  [facility] [address] [place], [address] Phone: [phone]   Fax:  [phone]  Name: Frank Hamilton MRN: [id number] Date of Birth: 12-02-1940

## 2018-08-05 ENCOUNTER — Ambulatory Visit (INDEPENDENT_AMBULATORY_CARE_PROVIDER_SITE_OTHER): Payer: Medicare Other

## 2018-08-05 VITALS — BP 140/58 | HR 62 | Temp 98.2°F

## 2018-08-05 DIAGNOSIS — I10 Essential (primary) hypertension: Secondary | ICD-10-CM

## 2018-08-05 DIAGNOSIS — E785 Hyperlipidemia, unspecified: Secondary | ICD-10-CM | POA: Diagnosis not present

## 2018-08-05 NOTE — Patient Instructions (Addendum)
Patient is here for a blood pressure check. Patient denies chest pain, palpitations, shortness of breath or visual disturbances. At previous visit blood pressure was 152/58 with a heart rate of 64. Today during nurse visit first check blood pressure was 152/60 (right arm, sitting, large cuff). After resting for 10 minutes it was 140/58 (left arm sitting, large cuff) and heart rate was. He does take blood pressure medications.  Patient had his labs done today and we will contact him afterwards to let him know if he needs to follow-up and when.

## 2018-08-06 ENCOUNTER — Ambulatory Visit: Payer: Medicare Other | Admitting: Physical Therapy

## 2018-08-06 ENCOUNTER — Encounter: Payer: Self-pay | Admitting: Family Medicine

## 2018-08-06 ENCOUNTER — Encounter: Payer: Self-pay | Admitting: Physical Therapy

## 2018-08-06 ENCOUNTER — Other Ambulatory Visit: Payer: Self-pay | Admitting: Family Medicine

## 2018-08-06 DIAGNOSIS — R809 Proteinuria, unspecified: Secondary | ICD-10-CM

## 2018-08-06 DIAGNOSIS — I10 Essential (primary) hypertension: Secondary | ICD-10-CM

## 2018-08-06 DIAGNOSIS — M6281 Muscle weakness (generalized): Secondary | ICD-10-CM

## 2018-08-06 LAB — COMPLETE METABOLIC PANEL WITH GFR
AG Ratio: 2 (calc) (ref 1.0–2.5)
ALKALINE PHOSPHATASE (APISO): 57 U/L (ref 40–115)
ALT: 33 U/L (ref 9–46)
AST: 25 U/L (ref 10–35)
Albumin: 4.5 g/dL (ref 3.6–5.1)
BUN: 13 mg/dL (ref 7–25)
CO2: 25 mmol/L (ref 20–32)
CREATININE: 0.72 mg/dL (ref 0.70–1.18)
Calcium: 9.5 mg/dL (ref 8.6–10.3)
Chloride: 107 mmol/L (ref 98–110)
GFR, Est African American: 106 mL/min/{1.73_m2} (ref >=60)
GFR, Est Non African American: 91 mL/min/{1.73_m2} (ref >=60)
GLUCOSE: 93 mg/dL (ref 65–99)
Globulin: 2.2 g/dL (calc) (ref 1.9–3.7)
Potassium: 4.4 mmol/L (ref 3.5–5.3)
SODIUM: 140 mmol/L (ref 135–146)
Total Bilirubin: 0.5 mg/dL (ref 0.2–1.2)
Total Protein: 6.7 g/dL (ref 6.1–8.1)

## 2018-08-06 LAB — LIPID PANEL
CHOL/HDL RATIO: 3.8 (calc) (ref <5.0)
CHOLESTEROL: 103 mg/dL (ref <200)
HDL: 27 mg/dL — ABNORMAL LOW (ref >40)
LDL CHOLESTEROL (CALC): 51 mg/dL
Non-HDL Cholesterol (Calc): 76 mg/dL (calc) (ref <130)
Triglycerides: 186 mg/dL — ABNORMAL HIGH (ref <150)

## 2018-08-06 LAB — CBC WITH DIFFERENTIAL/PLATELET
BASOS ABS: 19 {cells}/uL (ref 0–200)
Basophils Relative: 0.3 %
EOS ABS: 290 {cells}/uL (ref 15–500)
Eosinophils Relative: 4.6 %
HEMATOCRIT: 41.5 % (ref 38.5–50.0)
Hemoglobin: 14.3 g/dL (ref 13.2–17.1)
LYMPHS ABS: 1896 {cells}/uL (ref 850–3900)
MCH: 29.4 pg (ref 27.0–33.0)
MCHC: 34.5 g/dL (ref 32.0–36.0)
MCV: 85.2 fL (ref 80.0–100.0)
MPV: 9.9 fL (ref 7.5–12.5)
Monocytes Relative: 9.2 %
NEUTROS PCT: 55.8 %
Neutro Abs: 3515 cells/uL (ref 1500–7800)
Platelets: 205 10*3/uL (ref 140–400)
RBC: 4.87 10*6/uL (ref 4.20–5.80)
RDW: 13.8 % (ref 11.0–15.0)
Total Lymphocyte: 30.1 %
WBC: 6.3 10*3/uL (ref 3.8–10.8)
WBCMIX: 580 {cells}/uL (ref 200–950)

## 2018-08-06 LAB — MICROALBUMIN / CREATININE URINE RATIO
Creatinine, Urine: 174 mg/dL (ref 20–320)
MICROALB/CREAT RATIO: 342 ug/mg{creat} — AB (ref <30)
Microalb, Ur: 59.5 mg/dL

## 2018-08-06 NOTE — Therapy (Signed)
[facility] [address], [place], [address] Phone: [phone]   Fax:  [phone]  Physical Therapy Treatment  Patient Details  Name: Frank Hamilton MRN: [id number] Date of Birth: 09-19-1941 Referring Provider: Drue Stager MD   Encounter Date: 08/06/2018  PT End of Session - 08/06/18 1307    Visit Number  15    Number of Visits  30    Date for PT Re-Evaluation  08/21/18    PT Start Time  0100    PT Stop Time  0140    PT Time Calculation (min)  40 min    Activity Tolerance  Patient tolerated treatment well    Behavior During Therapy  [facility] for tasks assessed/performed       Past Medical History:  Diagnosis Date  . Hyperlipidemia   . Hypertension     Past Surgical History:  Procedure Laterality Date  . HERNIA REPAIR  2009 and 2010    There were no vitals filed for this visit.  Subjective Assessment - 08/06/18 1303    Subjective  Patient reports he is able to walk further after "warming up" now. Patient continues to report no pain, only muscle fatigue. Patient reports he feels like he can do "less in the heat" than he used to. Patient reports compliance with his HEP with no questions or concerns.     Pertinent History  Patient is a 77 year old male with bilat LE weakness, that reports he has been noticing LE weakness over the past 5 years that has gotten progressively worse. Patient reports that he has been retired since 2008 and had bilat plantar faciitis that year that he sought injection treatment for. Patient reports that he became "very aware" of this weakness last Nov when he tried to complete a heel raise on bilat feet and could not. Patient reports he is a "very active person" but has slowed down over the past 8 years, in a very gradual progression. Patient reports no pain at any time. Patient reports that his legs feel very tired with any activity. Patient reports he walks 3x/day (about 2/3 of a mile),  and completes housework, but is discouraged by how long it takes d/t leg weakness.    Limitations  House hold activities;Lifting;Standing;Walking    How long can you sit comfortably?  unlimited    How long can you stand comfortably?  3-19mnutes    How long can you walk comfortably?  3-451mutes (reports his 2/3 of a mile walk takes 3030mtes)    Diagnostic tests  DG hip and lumbar spine 07/12/16: Degenerative changes lumbar spine and both hips. No acute bony abnormality identified       Ther-Ex - Nustep L5 for 6mi107mL6 4min29mr increased endurance demand of quads - Total gym heel raises 2x 10 L20; x10 L21 with cuing for full ROM - Multiple trials of agility ladder (stepping R foot in; L foot in; R foot out; L foot out; and over again over to the next square) with patient increasing speed over trials and continuity of steps - Multiple trials of carioca drills with patient increasing speed throughout drills with CGA from PT for safety with occasional minA to maintain balance  - SL stance trials standing on level surface with LLE consistently 30sec holds RLE: 14sec and on airex pad with best time LLE: 13sec RLE: 11sec  PT Education - 08/06/18 1306    Education Details  Exercise form    Person(s) Educated  Patient    Methods  Explanation;Demonstration    Comprehension  Verbalized understanding;Returned demonstration;Verbal cues required       PT Short Term Goals - 06/12/18 1352      PT SHORT TERM GOAL #1   Title  Pt will be independent with HEP in order to improve strength and balance in order to decrease fall risk and improve function.    Time  3    Period  Weeks    Status  On-going        PT Long Term Goals - 07/29/18 0953      PT LONG TERM GOAL #1   Title  Pt will decrease 5TSTS by at least 3 seconds in order to demonstrate clinically significant improvement in LE strength    Baseline  07/29/18 12.32    Time  4    Period  Weeks     Status  On-going      PT LONG TERM GOAL #2   Title  Patient will be able to complete the TUG test in 17secs to demonstrate clinically significant change    Baseline  05/28/18 11sec    Time  4    Period  Weeks    Status  Achieved      PT LONG TERM GOAL #3   Title  Patient will perform bilat SLS for 45sec bilat to demonstrate age matched norms of reduced fall risk    Baseline  07/29/18 RLE 11sec;  LLE 16sec    Time  4    Period  Weeks    Status  On-going      PT LONG TERM GOAL #4   Title  Patient will increase FOTO score to 64 to demonstrate predicted increase in functional mobility to complete ADLs    Baseline  07/29/18    Time  4    Period  Weeks    Status  On-going      PT LONG TERM GOAL #5   Title  Pt will increase LEFS by at least 9 points in order to demonstrate significant improvement in lower extremity function    Baseline  07/29/18    Time  4    Period  Weeks    Status  On-going      PT LONG TERM GOAL #6   Title  Patient will demonstrate gait speed over 1.9ms over 126malk test to demonstrate age matched norms for safe, community ambulation    Baseline  07/29/18 0.9964m   Time  4    Period  Weeks    Status  On-going      PT LONG TERM GOAL #7   Title  Pt will increase 6MWT by at least 17m1m4ft82f order to demonstrate clinically significant improvement in muscular endurance and community ambulation    Baseline  07/29/18 860ft 33fime  4    Period  Weeks    Status  Not Met            Plan - 08/06/18 1417    Clinical Impression Statement  PT continued to progress therex with increased balance demand with LE endurance demand. Patient is able to complete balance drills with CGA-minA from PT to prevent falls. Patient requires rest breaks between all therex, but is able to take these in standing.     Clinical Presentation  Evolving    Clinical  Decision Making  Moderate    Rehab Potential  Good    Clinical Impairments Affecting Rehab Potential  (+) Motivation (-)  Sedentary lifestyle, age, other comorbidities, chroncity of sx    PT Frequency  2x / week    PT Duration  8 weeks    PT Treatment/Interventions  ADLs/Self Care Home Management;Therapeutic activities;Electrical Stimulation;Aquatic Therapy;Moist Heat;Traction;Ultrasound;Cryotherapy;Functional mobility training;Stair training;Gait training;Therapeutic exercise;Balance training;Patient/family education;Passive range of motion;Manual techniques;Dry needling;Taping;Neuromuscular re-education    PT Next Visit Plan   balance, LE strengthening at endurance range, increasing lumbar ROM    PT Home Exercise Plan  06/18/18: standing hip abd with red tband, SLS; eval: Hamstring stretch, seated heel raise, STS    Consulted and Agree with Plan of Care  Patient       Patient will benefit from skilled therapeutic intervention in order to improve the following deficits and impairments:  Decreased activity tolerance, Decreased strength, Increased fascial restricitons, Impaired flexibility, Postural dysfunction, Improper body mechanics, Impaired tone, Decreased range of motion, Decreased endurance, Decreased balance, Abnormal gait, Decreased mobility, Difficulty walking  Visit Diagnosis: Muscle weakness (generalized)     Problem List Patient Active Problem List   Diagnosis Date Noted  . PVD (peripheral vascular disease) with claudication ([place]) 07/02/2018  . Senile purpura ([place]) 07/02/2018  . Claudication ([place]) 04/23/2018  . Vitamin D deficiency 05/02/2016  . Osteoarthritis of both hands 05/01/2016  . Hypertension, benign 08/03/2015  . Hyperlipidemia 08/03/2015  . Kidney cysts 12/15/2013   Shelton Silvas PT, DPT Shelton Silvas 08/06/2018, 2:24 PM  [facility] [address] [place], [address] Phone: [phone]   Fax:  [phone]  Name: Frank Hamilton MRN: [id number] Date of Birth: [birth year]

## 2018-08-08 ENCOUNTER — Encounter: Payer: Self-pay | Admitting: Physical Therapy

## 2018-08-08 ENCOUNTER — Ambulatory Visit: Payer: Medicare Other | Admitting: Physical Therapy

## 2018-08-08 DIAGNOSIS — M6281 Muscle weakness (generalized): Secondary | ICD-10-CM

## 2018-08-08 NOTE — Therapy (Signed)
[facility] [address], [place], [address] Phone: [phone]   Fax:  [phone]  Physical Therapy Treatment  Patient Details  Name: Frank Hamilton MRN: [id number] Date of Birth: 1941/06/17 Referring Provider (PT): Drue Stager MD   Encounter Date: 08/08/2018    Past Medical History:  Diagnosis Date  . Hyperlipidemia   . Hypertension     Past Surgical History:  Procedure Laterality Date  . HERNIA REPAIR  2009 and 2010    There were no vitals filed for this visit.      Ther-Ex - Nustep L5 for 51mn; L6 344m for increased endurance demand of quads - 6MWT: 116841f- 3 trials of 5x STS test with best time 11.5sec - Total gym heel raises 3x 10 L20 with min cuing for proper form - SL stance trials standing on level surface with best time LLE: 36sec RLE: 16sec    - Squat with UE balance support at treadmill bar 3x 10 wit demo and min cuing for proper form with good carry over following                         PT Short Term Goals - 06/12/18 1352      PT SHORT TERM GOAL #1   Title  Pt will be independent with HEP in order to improve strength and balance in order to decrease fall risk and improve function.    Time  3    Period  Weeks    Status  On-going        PT Long Term Goals - 07/29/18 0953      PT LONG TERM GOAL #1   Title  Pt will decrease 5TSTS by at least 3 seconds in order to demonstrate clinically significant improvement in LE strength    Baseline  07/29/18 12.32    Time  4    Period  Weeks    Status  On-going      PT LONG TERM GOAL #2   Title  Patient will be able to complete the TUG test in 17secs to demonstrate clinically significant change    Baseline  05/28/18 11sec    Time  4    Period  Weeks    Status  Achieved      PT LONG TERM GOAL #3   Title  Patient will perform bilat SLS for 45sec bilat to demonstrate age matched norms of reduced fall risk    Baseline  07/29/18 RLE 11sec;  LLE 16sec    Time  4    Period  Weeks    Status  On-going      PT LONG TERM GOAL #4   Title  Patient will increase FOTO score to 64 to demonstrate predicted increase in functional mobility to complete ADLs    Baseline  07/29/18    Time  4    Period  Weeks    Status  On-going      PT LONG TERM GOAL #5   Title  Pt will increase LEFS by at least 9 points in order to demonstrate significant improvement in lower extremity function    Baseline  07/29/18    Time  4    Period  Weeks    Status  On-going      PT LONG TERM GOAL #6   Title  Patient will demonstrate gait speed over 1.66m/108mver 166m 17m test to demonstrate age  matched norms for safe, community ambulation    Baseline  07/29/18 0.62ms    Time  4    Period  Weeks    Status  On-going      PT LONG TERM GOAL #7   Title  Pt will increase 6MWT by at least [id number] order to demonstrate clinically significant improvement in muscular endurance and community ambulation    Baseline  07/29/18 860f96f Time  4    Period  Weeks    Status  Not Met              Patient will benefit from skilled therapeutic intervention in order to improve the following deficits and impairments:     Visit Diagnosis: No diagnosis found.     Problem List Patient Active Problem List   Diagnosis Date Noted  . Positive for macroalbuminuria 08/06/2018  . PVD (peripheral vascular disease) with claudication (HCC)[place]/20/2019  . Senile purpura (HCC)[place]/20/2019  . Claudication (HCC)[place]/09/2018  . Vitamin D deficiency 05/02/2016  . Osteoarthritis of both hands 05/01/2016  . Hypertension, benign 08/03/2015  . Hyperlipidemia 08/03/2015  . Kidney cysts 12/15/2013   ChelShelton Silvas DPT ChelShelton Silvas6/2019, 1:04 PM  [facility] [address] [place]ne: [phone]:  [phone]: DaviKAMARIE PALMA:  [phone] of Birth: [birth year]

## 2018-08-12 ENCOUNTER — Ambulatory Visit: Payer: Medicare Other | Admitting: Physical Therapy

## 2018-08-12 ENCOUNTER — Encounter: Payer: Self-pay | Admitting: Physical Therapy

## 2018-08-12 DIAGNOSIS — M6281 Muscle weakness (generalized): Secondary | ICD-10-CM

## 2018-08-12 NOTE — Therapy (Signed)
[facility] [address], [place], [address] Phone: [phone]   Fax:  [phone]  Physical Therapy Treatment/DC Summary  Patient Details  Name: Frank Hamilton MRN: [id number] Date of Birth: 09-Jul-1941 Referring Provider (PT): Drue Stager MD   Encounter Date: 08/12/2018  PT End of Session - 08/12/18 1035    Visit Number  17    Number of Visits  30    Date for PT Re-Evaluation  08/21/18    PT Start Time  1026    PT Stop Time  1105    PT Time Calculation (min)  39 min    Activity Tolerance  Patient tolerated treatment well    Behavior During Therapy  [facility] for tasks assessed/performed       Past Medical History:  Diagnosis Date  . Hyperlipidemia   . Hypertension     Past Surgical History:  Procedure Laterality Date  . HERNIA REPAIR  2009 and 2010    There were no vitals filed for this visit.    Ther-Ex - Nustep L5 for 27mn; L6 557m for increased endurance demand of quads; education during for increased aerobic activity for muscular endurance 15066mwk (about 11m25m day) of moderate activity (walking/brisk walking) Review of HEP of the following exercises with education of endurance parameters (5-6 exercises; 2-3x/week, 2-3 sets, 12-20 reps) with printout provided and demo of the following:  - Step ups w/o UE support 2x 12 starting with each LE - Bilat heel raise from step 2x 12 with min cuing for eccentric control - Standing hip ext red tband 2x 12 with min cuing for posture  - Standing hip abd red tband 2x 12 - Side stepping in mini squat 6x out 6x back x2 with min cuing to maintain squat position - Mini squat 2x 12 with min cuing initially for foot position  - SLS trials 2x each LE                     PT Education - 08/12/18 1034    Education Details  HEP/DC recommendations    Person(s) Educated  Patient    Methods  Explanation;Demonstration;Verbal cues;Handout     Comprehension  Verbalized understanding;Returned demonstration;Verbal cues required       PT Short Term Goals - 08/08/18 1309      PT SHORT TERM GOAL #1   Title  Pt will be independent with HEP in order to improve strength and balance in order to decrease fall risk and improve function.    Time  3    Period  Weeks    Status  On-going        PT Long Term Goals - 08/08/18 1318      PT LONG TERM GOAL #1   Title  Pt will decrease 5TSTS by at least 3 seconds in order to demonstrate clinically significant improvement in LE strength    Baseline  08/08/18 11.5sec    Status  Partially Met      PT LONG TERM GOAL #2   Title  Patient will be able to complete the TUG test in 17secs to demonstrate clinically significant change    Baseline  05/28/18 11sec    Time  4    Period  Weeks    Status  Achieved      PT LONG TERM GOAL #3   Title  Patient will demonstrate SLS >10sec each LE to demonstrate decreased fall risk  Baseline  08/07/18 RLE: 16sec LLE 36sec    Time  4    Period  Weeks    Status  Achieved      PT LONG TERM GOAL #4   Title  Patient will increase FOTO score to 64 to demonstrate predicted increase in functional mobility to complete ADLs    Baseline  08/07/18 55    Time  4    Period  Weeks    Status  Partially Met      PT LONG TERM GOAL #5   Title  Pt will increase LEFS by at least 9 points in order to demonstrate significant improvement in lower extremity function    Baseline  08/07/18 57    Status  Partially Met      PT LONG TERM GOAL #6   Title  Patient will demonstrate gait speed over 1.43ms over 159malk test to demonstrate age matched norms for safe, community ambulation    Baseline  08/07/18 0.9273m     PT LONG TERM GOAL #7   Title  Pt will increase 6MWT by at least 39m7m4ft69f order to demonstrate clinically significant improvement in muscular endurance and community ambulation    Baseline  08/07/18 1168ft 54fime  4    Period  Weeks    Status  Achieved             Plan - 08/12/18 1104    Clinical Impression Statement  PT went over HEP and DC recommendations with patient, which he is able to demonstrate and verbalize understanding of. Patient is DC PT at this time as he is returning to [place] and is reaching plateau with his goals. Patient is given clinic contact info should any further questions/concerns arise.     Rehab Potential  Good    Clinical Impairments Affecting Rehab Potential  (+) Motivation (-) Sedentary lifestyle, age, other comorbidities, chroncity of sx    PT Frequency  2x / week    PT Treatment/Interventions  ADLs/Self Care Home Management;Therapeutic activities;Electrical Stimulation;Aquatic Therapy;Moist Heat;Traction;Ultrasound;Cryotherapy;Functional mobility training;Stair training;Gait training;Therapeutic exercise;Balance training;Patient/family education;Passive range of motion;Manual techniques;Dry needling;Taping;Neuromuscular re-education    PT Home Exercise Plan  06/18/18: standing hip abd with red tband, SLS; eval: Hamstring stretch, seated heel raise, STS    Consulted and Agree with Plan of Care  Patient       Patient will benefit from skilled therapeutic intervention in order to improve the following deficits and impairments:  Decreased activity tolerance, Decreased strength, Increased fascial restricitons, Impaired flexibility, Postural dysfunction, Improper body mechanics, Impaired tone, Decreased range of motion, Decreased endurance, Decreased balance, Abnormal gait, Decreased mobility, Difficulty walking  Visit Diagnosis: Muscle weakness (generalized)     Problem List Patient Active Problem List   Diagnosis Date Noted  . Positive for macroalbuminuria 08/06/2018  . PVD (peripheral vascular disease) with claudication (HCC) 0[place]0/2019  . Senile purpura (HCC) 0[place]0/2019  . Claudication (HCC) 0[place]1/2019  . Vitamin D deficiency 05/02/2016  . Osteoarthritis of both hands 05/01/2016  . Hypertension, benign  08/03/2015  . Hyperlipidemia 08/03/2015  . Kidney cysts 12/15/2013   ChelseShelton SilvasPT ChelseShelton Silvas2019, 11:27 AM  [facility] [address]: [phone]  [phone]: Michaeljoseph THAYER [phone] Birth: [birth year]

## 2018-08-13 ENCOUNTER — Other Ambulatory Visit: Payer: Self-pay | Admitting: Family Medicine

## 2018-08-13 DIAGNOSIS — I10 Essential (primary) hypertension: Secondary | ICD-10-CM

## 2018-08-15 ENCOUNTER — Ambulatory Visit: Payer: Medicare Other | Admitting: Physical Therapy

## 2018-08-21 ENCOUNTER — Ambulatory Visit: Payer: Medicare Other | Admitting: Physical Therapy

## 2018-08-27 ENCOUNTER — Encounter: Payer: Medicare Other | Admitting: Physical Therapy

## 2018-08-29 ENCOUNTER — Encounter: Payer: Medicare Other | Admitting: Physical Therapy

## 2018-09-03 ENCOUNTER — Encounter: Payer: Medicare Other | Admitting: Physical Therapy

## 2018-09-05 ENCOUNTER — Encounter: Payer: Medicare Other | Admitting: Physical Therapy

## 2018-10-03 ENCOUNTER — Telehealth: Payer: Self-pay | Admitting: Family Medicine

## 2018-10-03 NOTE — Telephone Encounter (Signed)
Copied from [place] [phone]. Topic: General - Other >> Oct 03, 2018  2:24 PM Keene Breath wrote: Reason for CRM: Margreta Journey with [employer] called to verify if the patient's Benazepril medication is still an active medication for the patient.  Please clarify and call back at [phone]

## 2018-10-03 NOTE — Telephone Encounter (Signed)
Message left to advise the Benazepril has been discontinued. / See refill encounter from 08/13/18

## 2018-10-04 ENCOUNTER — Ambulatory Visit (INDEPENDENT_AMBULATORY_CARE_PROVIDER_SITE_OTHER): Payer: Medicare Other

## 2018-10-04 DIAGNOSIS — Z23 Encounter for immunization: Secondary | ICD-10-CM | POA: Diagnosis not present

## 2018-11-18 DIAGNOSIS — E1129 Type 2 diabetes mellitus with other diabetic kidney complication: Secondary | ICD-10-CM | POA: Diagnosis not present

## 2018-11-18 DIAGNOSIS — R809 Proteinuria, unspecified: Secondary | ICD-10-CM | POA: Diagnosis not present

## 2018-11-18 DIAGNOSIS — I10 Essential (primary) hypertension: Secondary | ICD-10-CM | POA: Diagnosis not present

## 2018-12-20 DIAGNOSIS — R809 Proteinuria, unspecified: Secondary | ICD-10-CM | POA: Diagnosis not present

## 2018-12-20 DIAGNOSIS — I10 Essential (primary) hypertension: Secondary | ICD-10-CM | POA: Diagnosis not present

## 2018-12-20 DIAGNOSIS — E1129 Type 2 diabetes mellitus with other diabetic kidney complication: Secondary | ICD-10-CM | POA: Diagnosis not present

## 2019-01-02 ENCOUNTER — Ambulatory Visit (INDEPENDENT_AMBULATORY_CARE_PROVIDER_SITE_OTHER): Payer: Medicare Other

## 2019-01-02 VITALS — BP 134/70 | HR 58 | Temp 97.7°F | Resp 16 | Ht 71.0 in | Wt 205.8 lb

## 2019-01-02 DIAGNOSIS — Z0000 Encounter for general adult medical examination without abnormal findings: Secondary | ICD-10-CM

## 2019-01-02 NOTE — Progress Notes (Signed)
Subjective:   MAHMOOD BOEHRINGER is a 78 y.o. male who presents for Medicare Annual/Subsequent preventive examination.  Review of Systems:   Cardiac Risk Factors include: advanced age (>61men, >29 women);dyslipidemia;male gender;hypertension     Objective:    Vitals: BP 134/70 (BP Location: Right Arm, Patient Position: Sitting, Cuff Size: Large)   Pulse (!) 58   Temp 97.7 F (36.5 C) (Oral)   Resp 16   Ht 5\' 11"  (1.803 m)   Wt 205 lb 12.8 oz (93.4 kg)   SpO2 94%   BMI 28.70 kg/m   Body mass index is 28.7 kg/m.  Advanced Directives 01/02/2019 04/17/2018 06/27/2017 03/19/2017 02/14/2017 11/15/2016 09/25/2016  Does Patient Have a Medical Advance Directive? No No No No No No No  Would patient like information on creating a medical advance directive? No - Patient declined No - Patient declined - - - - No - patient declined information    Tobacco Social History   Tobacco Use  Smoking Status Former Smoker  . Packs/day: 1.00  . Years: 20.00  . Pack years: 20.00  . Types: Cigarettes  . Last attempt to quit: 06/08/1974  . Years since quitting: 44.6  Smokeless Tobacco Never Used     Counseling given: Not Answered   Clinical Intake:  Pre-visit preparation completed: Yes  Pain : No/denies pain     BMI - recorded: 28.7 Nutritional Status: BMI 25 -29 Overweight Nutritional Risks: None Diabetes: No  How often do you need to have someone help you when you read instructions, pamphlets, or other written materials from your doctor or pharmacy?: 1 - Never  Interpreter Needed?: No  Information entered by :: Clemetine Marker LPN  Past Medical History:  Diagnosis Date  . Hyperlipidemia   . Hypertension   . Kidney cysts   . Osteoarthritis   . PVD (peripheral vascular disease) ([place])   . Vitamin D deficiency    Past Surgical History:  Procedure Laterality Date  . bowel obstruction  2011  . HERNIA REPAIR  2009 and 2010  . TONSILLECTOMY     age 5 or 11   Family History  Problem  Relation Age of Onset  . Heart disease Mother   . Stroke Father   . Heart disease Sister   . Diabetes Sister   . Diabetes Maternal Grandfather    Social History   Socioeconomic History  . Marital status: Widowed    Spouse name: Not on file  . Number of children: 3  . Years of education: Not on file  . Highest education level: High school graduate  Occupational History  . Occupation: retired  Scientific laboratory technician  . Financial resource strain: Not hard at all  . Food insecurity:    Worry: Never true    Inability: Never true  . Transportation needs:    Medical: No    Non-medical: No  Tobacco Use  . Smoking status: Former Smoker    Packs/day: 1.00    Years: 20.00    Pack years: 20.00    Types: Cigarettes    Last attempt to quit: 06/08/1974    Years since quitting: 44.6  . Smokeless tobacco: Never Used  Substance and Sexual Activity  . Alcohol use: Yes    Alcohol/week: 1.0 standard drinks    Types: 1 Cans of beer per week  . Drug use: No  . Sexual activity: Not Currently  Lifestyle  . Physical activity:    Days per week: 7 days    Minutes  per session: 30 min  . Stress: Only a little  Relationships  . Social connections:    Talks on phone: More than three times a week    Gets together: Twice a week    Attends religious service: Never    Active member of club or organization: No    Attends meetings of clubs or organizations: Never    Relationship status: Widowed  Other Topics Concern  . Not on file  Social History Narrative   He was married for 58 years , wife died in 02/07/16. Lives with his dog    Outpatient Encounter Medications as of 01/02/2019  Medication Sig  . amLODipine (NORVASC) 10 MG tablet Take 1 tablet (10 mg total) by mouth daily.  Marland Kitchen aspirin 81 MG chewable tablet Chew 81 mg by mouth.  Marland Kitchen atorvastatin (LIPITOR) 40 MG tablet Take 1 tablet (40 mg total) by mouth daily.  . Cholecalciferol (VITAMIN D) 2000 units CAPS Take 1 capsule (2,000 Units total) by mouth daily.   Marland Kitchen glucosamine-chondroitin 500-400 MG tablet Take 1 tablet by mouth 3 (three) times daily.  . hydrochlorothiazide (HYDRODIURIL) 12.5 MG tablet Take 1 tablet (12.5 mg total) by mouth daily.  Marland Kitchen losartan (COZAAR) 50 MG tablet Take 50 mg by mouth daily.  Marland Kitchen omega-3 acid ethyl esters (LOVAZA) 1 g capsule Take 2 capsules (2 g total) by mouth 2 (two) times daily.   No facility-administered encounter medications on file as of 01/02/2019.     Activities of Daily Living In your present state of health, do you have any difficulty performing the following activities: 01/02/2019 07/02/2018  Hearing? Y N  Comment plans to go to [place] for hearing aids -  Vision? N N  Difficulty concentrating or making decisions? N N  Walking or climbing stairs? N N  Dressing or bathing? N N  Doing errands, shopping? N N  Preparing Food and eating ? N -  Using the Toilet? N -  In the past six months, have you accidently leaked urine? N -  Do you have problems with loss of bowel control? N -  Managing your Medications? N -  Managing your Finances? N -  Housekeeping or managing your Housekeeping? N -  Some recent data might be hidden    Patient Care Team: Steele Sizer, MD as PCP - General (Family Medicine)   Assessment:   This is a routine wellness examination for [place].  Exercise Activities and Dietary recommendations Current Exercise Habits: Home exercise routine, Type of exercise: stretching;calisthenics;walking, Time (Minutes): 30, Frequency (Times/Week): 7, Weekly Exercise (Minutes/Week): 210, Intensity: Mild, Exercise limited by: orthopedic condition(s)  Goals    . Patient Stated     Patient states he would like to remain healthy and continue to improve gait, balance and strength in legs.       Fall Risk Fall Risk  01/02/2019 07/02/2018 04/01/2018 12/28/2017 09/27/2017  Falls in the past year? 0 No No No No  Number falls in past yr: 0 - - - -  Injury with Fall? 0 - - - -  Risk for fall due to : Impaired  balance/gait - - - -  Follow up Falls prevention discussed - - - -   FALL RISK PREVENTION PERTAINING TO THE HOME:  Any stairs in or around the home? Yes  If so, do they handrails? Yes   Home free of loose throw rugs in walkways, pet beds, electrical cords, etc? Yes  Adequate lighting in your home to reduce risk of  falls? Yes   ASSISTIVE DEVICES UTILIZED TO PREVENT FALLS:  Life alert? No  Use of a cane, walker or w/c? No  Grab bars in the bathroom? No  Shower chair or bench in shower? Yes  Elevated toilet seat or a handicapped toilet? No   DME ORDERS:  DME order needed?  No   TIMED UP AND GO:  Was the test performed? Yes .  Length of time to ambulate 10 feet: 7 sec.   GAIT:  Appearance of gait: Gait stead-fast and without the use of an assistive device.  Education: Fall risk prevention has been discussed.  Intervention(s) required? No   Depression Screen PHQ 2/9 Scores 01/02/2019 07/02/2018 12/28/2017 09/27/2017  PHQ - 2 Score 0 0 0 0    Cognitive Function     6CIT Screen 01/02/2019  What Year? 0 points  What month? 0 points  What time? 0 points  Count back from 20 0 points  Months in reverse 0 points  Repeat phrase 0 points  Total Score 0    Immunization History  Administered Date(s) Administered  . Influenza, High Dose Seasonal PF 11/15/2016, 09/27/2017, 10/04/2018  . Influenza,inj,Quad PF,6+ Mos 08/03/2015  . Influenza-Unspecified 09/13/2013  . Pneumococcal Conjugate-13 04/01/2018    Qualifies for Shingles Vaccine? Yes  Zostavax completed per patient unknown date. Due for Shingrix. Education has been provided regarding the importance of this vaccine. Pt has been advised to call insurance company to determine out of pocket expense. Advised may also receive vaccine at local pharmacy or Health Dept. Verbalized acceptance and understanding.  Tdap: Up to date  Flu Vaccine: Up to date  Pneumococcal Vaccine: Up to date   Screening Tests Health Maintenance    Topic Date Due  . TETANUS/TDAP  08/14/2021  . COLONOSCOPY  11/13/2022  . INFLUENZA VACCINE  Completed  . PNA vac Low Risk Adult  Completed   Cancer Screenings:  Colorectal Screening: Completed 2014. Repeat every 10 years.  Lung Cancer Screening: (Low Dose CT Chest recommended if Age 84-80 years, 30 pack-year currently smoking OR have quit w/in 15years.) does not qualify.   Additional Screening:  Hepatitis C Screening: no longer required  Vision Screening: Recommended annual ophthalmology exams for early detection of glaucoma and other disorders of the eye. Is the patient up to date with their annual eye exam?  Yes  Who is the provider or what is the name of the office in which the pt attends annual eye exams? [facility]  Dental Screening: Recommended annual dental exams for proper oral hygiene  Community Resource Referral:  CRR required this visit?  No        Plan:    I have personally reviewed and addressed the Medicare Annual Wellness questionnaire and have noted the following in the patient's chart:  A. Medical and social history B. Use of alcohol, tobacco or illicit drugs  C. Current medications and supplements D. Functional ability and status E.  Nutritional status F.  Physical activity G. Advance directives H. List of other physicians I.  Hospitalizations, surgeries, and ER visits in previous 12 months J.  [place] such as hearing and vision if needed, cognitive and depression L. Referrals and appointments   In addition, I have reviewed and discussed with patient certain preventive protocols, quality metrics, and best practice recommendations. A written personalized care plan for preventive services as well as general preventive health recommendations were provided to patient.   Signed,  Clemetine Marker, LPN Nurse Health Advisor  Nurse Notes: pt doing well and appreciative of visit today

## 2019-01-02 NOTE — Patient Instructions (Signed)
Frank Hamilton , Thank you for taking time to come for your Medicare Wellness Visit. I appreciate your ongoing commitment to your health goals. Please review the following plan we discussed and let me know if I can assist you in the future.   Screening recommendations/referrals: Colonoscopy: completed 2014. Repeat in 2024.  Recommended yearly ophthalmology/optometry visit for glaucoma screening and checkup Recommended yearly dental visit for hygiene and checkup  Vaccinations: Influenza vaccine: done 10/04/18 Pneumococcal vaccine: done 04/01/18 Tdap vaccine: done 08/15/11 Shingles vaccine: Shingrix discussed. Please contact your pharmacy for coverage information.     Advanced directives: Please bring a copy of your health care power of attorney and living will to the office at your convenience.  Conditions/risks identified: Continue healthy eating and physical activity.   Next appointment: Please follow up in one year for your Medicare Annual Wellness visit.    Preventive Care 76 Years and Older, Male Preventive care refers to lifestyle choices and visits with your health care provider that can promote health and wellness. What does preventive care include?  A yearly physical exam. This is also called an annual well check.  Dental exams once or twice a year.  Routine eye exams. Ask your health care provider how often you should have your eyes checked.  Personal lifestyle choices, including:  Daily care of your teeth and gums.  Regular physical activity.  Eating a healthy diet.  Avoiding tobacco and drug use.  Limiting alcohol use.  Practicing safe sex.  Taking low doses of aspirin every day.  Taking vitamin and mineral supplements as recommended by your health care provider. What happens during an annual well check? The services and screenings done by your health care provider during your annual well check will depend on your age, overall health, lifestyle risk factors,  and family history of disease. Counseling  Your health care provider may ask you questions about your:  Alcohol use.  Tobacco use.  Drug use.  Emotional well-being.  Home and relationship well-being.  Sexual activity.  Eating habits.  History of falls.  Memory and ability to understand (cognition).  Work and work Statistician. Screening  You may have the following tests or measurements:  Height, weight, and BMI.  Blood pressure.  Lipid and cholesterol levels. These may be checked every 5 years, or more frequently if you are over 36 years old.  Skin check.  Lung cancer screening. You may have this screening every year starting at age 24 if you have a 30-pack-year history of smoking and currently smoke or have quit within the past 15 years.  Fecal occult blood test (FOBT) of the stool. You may have this test every year starting at age 72.  Flexible sigmoidoscopy or colonoscopy. You may have a sigmoidoscopy every 5 years or a colonoscopy every 10 years starting at age 73.  Prostate cancer screening. Recommendations will vary depending on your family history and other risks.  Hepatitis C blood test.  Hepatitis B blood test.  Sexually transmitted disease (STD) testing.  Diabetes screening. This is done by checking your blood sugar (glucose) after you have not eaten for a while (fasting). You may have this done every 1-3 years.  Abdominal aortic aneurysm (AAA) screening. You may need this if you are a current or former smoker.  Osteoporosis. You may be screened starting at age 39 if you are at high risk. Talk with your health care provider about your test results, treatment options, and if necessary, the need for more tests. Vaccines  Your health care provider may recommend certain vaccines, such as:  Influenza vaccine. This is recommended every year.  Tetanus, diphtheria, and acellular pertussis (Tdap, Td) vaccine. You may need a Td booster every 10  years.  Zoster vaccine. You may need this after age 65.  Pneumococcal 13-valent conjugate (PCV13) vaccine. One dose is recommended after age 14.  Pneumococcal polysaccharide (PPSV23) vaccine. One dose is recommended after age 45. Talk to your health care provider about which screenings and vaccines you need and how often you need them. This information is not intended to replace advice given to you by your health care provider. Make sure you discuss any questions you have with your health care provider. Document Released: 11/26/2015 Document Revised: 07/19/2016 Document Reviewed: 08/31/2015 Elsevier Interactive Patient Education  2017 [place] Prevention in the Home Falls can cause injuries. They can happen to people of all ages. There are many things you can do to make your home safe and to help prevent falls. What can I do on the outside of my home?  Regularly fix the edges of walkways and driveways and fix any cracks.  Remove anything that might make you trip as you walk through a door, such as a raised step or threshold.  Trim any bushes or trees on the path to your home.  Use bright outdoor lighting.  Clear any walking paths of anything that might make someone trip, such as rocks or tools.  Regularly check to see if handrails are loose or broken. Make sure that both sides of any steps have handrails.  Any raised decks and porches should have guardrails on the edges.  Have any leaves, snow, or ice cleared regularly.  Use sand or salt on walking paths during winter.  Clean up any spills in your garage right away. This includes oil or grease spills. What can I do in the bathroom?  Use night lights.  Install grab bars by the toilet and in the tub and shower. Do not use towel bars as grab bars.  Use non-skid mats or decals in the tub or shower.  If you need to sit down in the shower, use a plastic, non-slip stool.  Keep the floor dry. Clean up any water that  spills on the floor as soon as it happens.  Remove soap buildup in the tub or shower regularly.  Attach bath mats securely with double-sided non-slip rug tape.  Do not have throw rugs and other things on the floor that can make you trip. What can I do in the bedroom?  Use night lights.  Make sure that you have a light by your bed that is easy to reach.  Do not use any sheets or blankets that are too big for your bed. They should not hang down onto the floor.  Have a firm chair that has side arms. You can use this for support while you get dressed.  Do not have throw rugs and other things on the floor that can make you trip. What can I do in the kitchen?  Clean up any spills right away.  Avoid walking on wet floors.  Keep items that you use a lot in easy-to-reach places.  If you need to reach something above you, use a strong step stool that has a grab bar.  Keep electrical cords out of the way.  Do not use floor polish or wax that makes floors slippery. If you must use wax, use non-skid floor wax.  Do  not have throw rugs and other things on the floor that can make you trip. What can I do with my stairs?  Do not leave any items on the stairs.  Make sure that there are handrails on both sides of the stairs and use them. Fix handrails that are broken or loose. Make sure that handrails are as long as the stairways.  Check any carpeting to make sure that it is firmly attached to the stairs. Fix any carpet that is loose or worn.  Avoid having throw rugs at the top or bottom of the stairs. If you do have throw rugs, attach them to the floor with carpet tape.  Make sure that you have a light switch at the top of the stairs and the bottom of the stairs. If you do not have them, ask someone to add them for you. What else can I do to help prevent falls?  Wear shoes that:  Do not have high heels.  Have rubber bottoms.  Are comfortable and fit you well.  Are closed at the  toe. Do not wear sandals.  If you use a stepladder:  Make sure that it is fully opened. Do not climb a closed stepladder.  Make sure that both sides of the stepladder are locked into place.  Ask someone to hold it for you, if possible.  Clearly mark and make sure that you can see:  Any grab bars or handrails.  First and last steps.  Where the edge of each step is.  Use tools that help you move around (mobility aids) if they are needed. These include:  Canes.  Walkers.  Scooters.  Crutches.  Turn on the lights when you go into a dark area. Replace any light bulbs as soon as they burn out.  Set up your furniture so you have a clear path. Avoid moving your furniture around.  If any of your floors are uneven, fix them.  If there are any pets around you, be aware of where they are.  Review your medicines with your doctor. Some medicines can make you feel dizzy. This can increase your chance of falling. Ask your doctor what other things that you can do to help prevent falls. This information is not intended to replace advice given to you by your health care provider. Make sure you discuss any questions you have with your health care provider. Document Released: 08/26/2009 Document Revised: 04/06/2016 Document Reviewed: 12/04/2014 Elsevier Interactive Patient Education  2017 [employer].

## 2019-01-03 DIAGNOSIS — R809 Proteinuria, unspecified: Secondary | ICD-10-CM | POA: Diagnosis not present

## 2019-01-03 DIAGNOSIS — I10 Essential (primary) hypertension: Secondary | ICD-10-CM | POA: Diagnosis not present

## 2019-02-17 ENCOUNTER — Other Ambulatory Visit: Payer: Self-pay | Admitting: Family Medicine

## 2019-02-17 DIAGNOSIS — I10 Essential (primary) hypertension: Secondary | ICD-10-CM

## 2019-02-17 NOTE — Telephone Encounter (Signed)
LVM for pt to call the office to schedule an appt. °

## 2019-02-18 ENCOUNTER — Other Ambulatory Visit: Payer: Self-pay

## 2019-02-18 ENCOUNTER — Ambulatory Visit (INDEPENDENT_AMBULATORY_CARE_PROVIDER_SITE_OTHER): Payer: Medicare Other | Admitting: Family Medicine

## 2019-02-18 ENCOUNTER — Encounter: Payer: Self-pay | Admitting: Family Medicine

## 2019-02-18 VITALS — BP 146/70

## 2019-02-18 DIAGNOSIS — I10 Essential (primary) hypertension: Secondary | ICD-10-CM | POA: Diagnosis not present

## 2019-02-18 DIAGNOSIS — E559 Vitamin D deficiency, unspecified: Secondary | ICD-10-CM | POA: Diagnosis not present

## 2019-02-18 DIAGNOSIS — R809 Proteinuria, unspecified: Secondary | ICD-10-CM

## 2019-02-18 DIAGNOSIS — I739 Peripheral vascular disease, unspecified: Secondary | ICD-10-CM | POA: Diagnosis not present

## 2019-02-18 DIAGNOSIS — E785 Hyperlipidemia, unspecified: Secondary | ICD-10-CM | POA: Diagnosis not present

## 2019-02-18 MED ORDER — AMLODIPINE BESYLATE 10 MG PO TABS: 10.0000 mg | ORAL_TABLET | Freq: Every day | ORAL | 1 refills | Status: DC

## 2019-02-18 MED ORDER — ATORVASTATIN CALCIUM 40 MG PO TABS: 40.0000 mg | ORAL_TABLET | Freq: Every day | ORAL | 1 refills | Status: DC

## 2019-02-18 MED ORDER — OMEGA-3-ACID ETHYL ESTERS 1 G PO CAPS: 2.0000 g | ORAL_CAPSULE | Freq: Two times a day (BID) | ORAL | 1 refills | Status: DC

## 2019-02-18 NOTE — Progress Notes (Signed)
Name: Frank Hamilton   MRN: [id number]    DOB: 08/20/1941   Date:02/18/2019       Progress Note  Subjective  Chief Complaint  Chief Complaint  Patient presents with  . Medication Refill  . Hypertension    Denies any symptoms  . Gastroesophageal Reflux    Well controlled   . Dyslipidemia    I connected with@ on 02/18/19 at  1:20 PM EDT by telephone and verified that I am speaking with the correct person using two identifiers.  I discussed the limitations, risks, security and privacy concerns of performing an evaluation and management service by telephone and the availability of in person appointments. Staff also discussed with the patient that there may be a patient responsible charge related to this service. Patient Location: home  Provider Location: [facility]   HPI  HTN: taking medication and denies side effects, no chest pain or palpitation. BP at home today was at goal, he usually does not check it very often . 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 He denies side effects of medication, labs reviewed with patient   GERD: indigestion at night, taking Tums about once or twice a week, under control at this time   OA: mostly on hands taking otc glucosamine, not on nsaid's except at most once a week takes Naproxen when very active. Unchanged   PVD with claudication: mildly abnormal ABI and completed PT, he can walk down his driveway - 2/3 mile without having to stop. He states stable  History of left kidney cyst : that went down in size from 2014 to 2015 and per studies stable, no hematuria. Unchanged   Patient Active Problem List   Diagnosis Date Noted  . Positive for macroalbuminuria 08/06/2018  . PVD (peripheral vascular disease) with claudication ([place]) 07/02/2018  . Senile purpura ([place]) 07/02/2018  . Claudication  ([place]) 04/23/2018  . Vitamin D deficiency 05/02/2016  . Osteoarthritis of both hands 05/01/2016  . Hypertension, benign 08/03/2015  . Hyperlipidemia 08/03/2015  . Kidney cysts 12/15/2013    Past Surgical History:  Procedure Laterality Date  . bowel obstruction  2011  . HERNIA REPAIR  2009 and 2010  . TONSILLECTOMY     age 30 or 4    Family History  Problem Relation Age of Onset  . Heart disease Mother   . Stroke Father   . Heart disease Sister   . Diabetes Sister   . Diabetes Maternal Grandfather     Social History   Socioeconomic History  . Marital status: Widowed    Spouse name: Not on file  . Number of children: 3  . Years of education: Not on file  . Highest education level: High school graduate  Occupational History  . Occupation: retired  Scientific laboratory technician  . Financial resource strain: Not hard at all  . Food insecurity:    Worry: Never true    Inability: Never true  . Transportation needs:    Medical: No    Non-medical: No  Tobacco Use  . Smoking status: Former Smoker    Packs/day: 1.00    Years: 20.00    Pack years: 20.00    Types: Cigarettes    Last attempt to quit: 06/08/1974    Years since quitting: 44.7  . Smokeless tobacco: Never Used  Substance and Sexual Activity  . Alcohol  use: Yes    Alcohol/week: 1.0 standard drinks    Types: 1 Cans of beer per week  . Drug use: No  . Sexual activity: Not Currently  Lifestyle  . Physical activity:    Days per week: 7 days    Minutes per session: 30 min  . Stress: Only a little  Relationships  . Social connections:    Talks on phone: More than three times a week    Gets together: Twice a week    Attends religious service: Never    Active member of club or organization: No    Attends meetings of clubs or organizations: Never    Relationship status: Widowed  . Intimate partner violence:    Fear of current or ex partner: No    Emotionally abused: No    Physically abused: No    Forced sexual activity: No   Other Topics Concern  . Not on file  Social History Narrative   He was married for 40 years , wife died in 03-01-2016. Lives with his dog     Current Outpatient Medications:  .  amLODipine (NORVASC) 10 MG tablet, Take 1 tablet (10 mg total) by mouth daily., Disp: 90 tablet, Rfl: 1 .  aspirin 81 MG chewable tablet, Chew 81 mg by mouth., Disp: , Rfl:  .  atorvastatin (LIPITOR) 40 MG tablet, Take 1 tablet (40 mg total) by mouth daily., Disp: 90 tablet, Rfl: 1 .  Cholecalciferol (VITAMIN D) 2000 units CAPS, Take 1 capsule (2,000 Units total) by mouth daily., Disp: 30 capsule, Rfl:  .  glucosamine-chondroitin 500-400 MG tablet, Take 1 tablet by mouth 3 (three) times daily., Disp: , Rfl:  .  hydrochlorothiazide (HYDRODIURIL) 12.5 MG tablet, Take 1 tablet (12.5 mg total) by mouth daily., Disp: 90 tablet, Rfl: 3 .  losartan (COZAAR) 50 MG tablet, Take 50 mg by mouth daily., Disp: , Rfl:  .  omega-3 acid ethyl esters (LOVAZA) 1 g capsule, Take 2 capsules (2 g total) by mouth 2 (two) times daily., Disp: 360 capsule, Rfl: 1  Allergies  Allergen Reactions  . Sulfa Antibiotics     I personally reviewed active problem list, medication list, allergies, family history, social history with the patient/caregiver today.   ROS  Constitutional: Negative for fever or weight change.  Respiratory: Negative for cough and shortness of breath.   Cardiovascular: Negative for chest pain or palpitations.  Gastrointestinal: Negative for abdominal pain, no bowel changes.  Musculoskeletal: Negative for gait problem ( except for PVD )  or joint swelling.  Skin: Negative for rash.  Neurological: Negative for dizziness or headache.  No other specific complaints in a complete review of systems (except as listed in HPI above).  Objective  Virtual encounter, vitals not obtained.  There is no height or weight on file to calculate BMI.  Physical Exam  Awake, alert and does not seem to be in distress  PHQ2/9:  Depression screen [facility] 2/9 02/18/2019 01/02/2019 07/02/2018 12/28/2017 09/27/2017  Decreased Interest 0 0 0 0 0  Down, Depressed, Hopeless 0 0 0 0 0  PHQ - 2 Score 0 0 0 0 0  Altered sleeping 0 - - - -  Tired, decreased energy 0 - - - -  Change in appetite 0 - - - -  Feeling bad or failure about yourself  0 - - - -  Trouble concentrating 0 - - - -  Moving slowly or fidgety/restless 0 - - - -  Suicidal thoughts 0 - - - -  PHQ-9 Score 0 - - - -  Difficult doing work/chores Not difficult at all - - - -   PHQ-2/9 Result is negative.    Fall Risk: Fall Risk  01/02/2019 07/02/2018 04/01/2018 12/28/2017 09/27/2017  Falls in the past year? 0 No No No No  Number falls in past yr: 0 - - - -  Injury with Fall? 0 - - - -  Risk for fall due to : Impaired balance/gait - - - -  Follow up Falls prevention discussed - - - -     Assessment & Plan  1. Essential hypertension  - amLODipine (NORVASC) 10 MG tablet; Take 1 tablet (10 mg total) by mouth daily.  Dispense: 90 tablet; Refill: 1  2. Dyslipidemia  - omega-3 acid ethyl esters (LOVAZA) 1 g capsule; Take 2 capsules (2 g total) by mouth 2 (two) times daily.  Dispense: 360 capsule; Refill: 1 - atorvastatin (LIPITOR) 40 MG tablet; Take 1 tablet (40 mg total) by mouth daily.  Dispense: 90 tablet; Refill: 1  3. PVD (peripheral vascular disease) with claudication ([place])  Discussed importance to staying active   4. Vitamin D deficiency  Continue otc supplementation   5. Albuminuria  Keep follow up with Dr. Abigail Butts   I discussed the assessment and treatment plan with the patient. The patient was provided an opportunity to ask questions and all were answered. The patient agreed with the plan and demonstrated an understanding of the instructions.   The patient was advised to call back or seek an in-person evaluation if the symptoms worsen or if the condition fails to improve as anticipated.  I provided 15 minutes of non-face-to-face time during this  encounter.  Loistine Chance, MD

## 2019-03-31 DIAGNOSIS — H40003 Preglaucoma, unspecified, bilateral: Secondary | ICD-10-CM | POA: Diagnosis not present

## 2019-04-28 ENCOUNTER — Telehealth: Payer: Self-pay

## 2019-04-28 NOTE — Telephone Encounter (Signed)
Copied from [place] [phone]. Topic: Referral - Request for Referral >> Apr 25, 2019  3:41 PM Erick Blinks wrote: Has patient seen PCP for this complaint? Yes  *If NO, is insurance requiring patient see PCP for this issue before PCP can refer them? Referral for which specialty: Orthopedic Preferred provider/office: [facility] recommended Reason for referral: Wrist pain ever sprain of November 2019

## 2019-04-29 NOTE — Telephone Encounter (Signed)
Copied from [place] [phone]. Topic: Referral - Request for Referral >> Apr 25, 2019  3:41 PM Erick Blinks wrote: Has patient seen PCP for this complaint? Yes  *If NO, is insurance requiring patient see PCP for this issue before PCP can refer them? Referral for which specialty: Orthopedic Preferred provider/office: [facility] recommended Reason for referral: Wrist pain ever sprain of November 2019

## 2019-04-29 NOTE — Telephone Encounter (Signed)
Copied from [place] [phone]. Topic: Referral - Request for Referral >> Apr 25, 2019  3:41 PM Erick Blinks wrote: Has patient seen PCP for this complaint? Yes  *If NO, is insurance requiring patient see PCP for this issue before PCP can refer them? Referral for which specialty: Orthopedic Preferred provider/office: [facility] recommended Reason for referral: Wrist pain ever sprain of November 2019

## 2019-04-29 NOTE — Telephone Encounter (Signed)
appt scheduled

## 2019-05-02 ENCOUNTER — Other Ambulatory Visit: Payer: Self-pay

## 2019-05-02 ENCOUNTER — Ambulatory Visit (INDEPENDENT_AMBULATORY_CARE_PROVIDER_SITE_OTHER): Payer: Medicare Other | Admitting: Family Medicine

## 2019-05-02 ENCOUNTER — Encounter: Payer: Self-pay | Admitting: Family Medicine

## 2019-05-02 VITALS — BP 134/65 | HR 68

## 2019-05-02 DIAGNOSIS — M25531 Pain in right wrist: Secondary | ICD-10-CM | POA: Diagnosis not present

## 2019-05-02 NOTE — Progress Notes (Signed)
Name: Frank Hamilton   MRN: [id number]    DOB: 1941-09-20   Date:05/02/2019       Progress Note  Subjective  Chief Complaint  Chief Complaint  Patient presents with  . Referral    sprang his right wrist back in November still has pain in it. Wants a referral to Ortho.    I connected with  [name] on 05/02/19 at  3:20 PM EDT by telephone and verified that I am speaking with the correct person using two identifiers.  I discussed the limitations, risks, security and privacy concerns of performing an evaluation and management service by telephone and the availability of in person appointments. Staff also discussed with the patient that there may be a patient responsible charge related to this service. Patient Location: at home Provider Location: [facility]    HPI  Right wrist pain: he states pain started in Nov when he got in his truck and twisted and applied weight on his right wrist, pain was present right away, but since than continues to have mild pain, he states right dorsal wrist looks different, also states vibration and movement like using an ax and he is worried and would like to see Ortho  Patient Active Problem List   Diagnosis Date Noted  . Positive for macroalbuminuria 08/06/2018  . PVD (peripheral vascular disease) with claudication ([place]) 07/02/2018  . Senile purpura ([place]) 07/02/2018  . Claudication ([place]) 04/23/2018  . Vitamin D deficiency 05/02/2016  . Osteoarthritis of both hands 05/01/2016  . Hypertension, benign 08/03/2015  . Hyperlipidemia 08/03/2015  . Kidney cysts 12/15/2013    Past Surgical History:  Procedure Laterality Date  . bowel obstruction  2011  . HERNIA REPAIR  2009 and 2010  . TONSILLECTOMY     age 34 or 63    Family History  Problem Relation Age of Onset  . Heart disease Mother   . Stroke Father   . Heart disease Sister   . Diabetes Sister   . Diabetes Maternal Grandfather     Social History   Socioeconomic History  .  Marital status: Widowed    Spouse name: Not on file  . Number of children: 3  . Years of education: Not on file  . Highest education level: High school graduate  Occupational History  . Occupation: retired  Scientific laboratory technician  . Financial resource strain: Not hard at all  . Food insecurity    Worry: Never true    Inability: Never true  . Transportation needs    Medical: No    Non-medical: No  Tobacco Use  . Smoking status: Former Smoker    Packs/day: 1.00    Years: 20.00    Pack years: 20.00    Types: Cigarettes    Quit date: 06/08/1974    Years since quitting: 44.9  . Smokeless tobacco: Never Used  Substance and Sexual Activity  . Alcohol use: Yes    Alcohol/week: 1.0 standard drinks    Types: 1 Cans of beer per week  . Drug use: No  . Sexual activity: Not Currently  Lifestyle  . Physical activity    Days per week: 7 days    Minutes per session: 30 min  . Stress: Only a little  Relationships  . Social connections    Talks on phone: More than three times a week    Gets together: Twice a week    Attends religious service: Never    Active member of club or organization:  No    Attends meetings of clubs or organizations: Never    Relationship status: Widowed  . Intimate partner violence    Fear of current or ex partner: No    Emotionally abused: No    Physically abused: No    Forced sexual activity: No  Other Topics Concern  . Not on file  Social History Narrative   He was married for 87 years , wife died in 02-21-2016. Lives with his dog     Current Outpatient Medications:  .  amLODipine (NORVASC) 10 MG tablet, Take 1 tablet (10 mg total) by mouth daily., Disp: 90 tablet, Rfl: 1 .  aspirin 81 MG chewable tablet, Chew 81 mg by mouth., Disp: , Rfl:  .  atorvastatin (LIPITOR) 40 MG tablet, Take 1 tablet (40 mg total) by mouth daily., Disp: 90 tablet, Rfl: 1 .  Cholecalciferol (VITAMIN D) 2000 units CAPS, Take 1 capsule (2,000 Units total) by mouth daily., Disp: 30 capsule,  Rfl:  .  glucosamine-chondroitin 500-400 MG tablet, Take 1 tablet by mouth 3 (three) times daily., Disp: , Rfl:  .  hydrochlorothiazide (HYDRODIURIL) 12.5 MG tablet, Take 1 tablet (12.5 mg total) by mouth daily., Disp: 90 tablet, Rfl: 3 .  losartan (COZAAR) 50 MG tablet, Take 50 mg by mouth daily., Disp: , Rfl:  .  omega-3 acid ethyl esters (LOVAZA) 1 g capsule, Take 2 capsules (2 g total) by mouth 2 (two) times daily., Disp: 360 capsule, Rfl: 1  Allergies  Allergen Reactions  . Sulfa Antibiotics     I personally reviewed active problem list, medication list, allergies, family history, social history with the patient/caregiver today.   ROS  Ten systems reviewed and is negative except as mentioned in HPI   Objective  Vitals:   05/02/19 1523  BP: 134/65  Pulse: 68    There is no height or weight on file to calculate BMI.  Physical Exam  Awake, alert and oriented  PHQ2/9: Depression screen [facility] 2/9 05/02/2019 02/18/2019 01/02/2019 07/02/2018 12/28/2017  Decreased Interest 0 0 0 0 0  Down, Depressed, Hopeless 0 0 0 0 0  PHQ - 2 Score 0 0 0 0 0  Altered sleeping 0 0 - - -  Tired, decreased energy 0 0 - - -  Change in appetite 0 0 - - -  Feeling bad or failure about yourself  0 0 - - -  Trouble concentrating 0 0 - - -  Moving slowly or fidgety/restless 0 0 - - -  Suicidal thoughts 0 0 - - -  PHQ-9 Score 0 0 - - -  Difficult doing work/chores - Not difficult at all - - -   PHQ-2/9 Result is negative.    Fall Risk: Fall Risk  05/02/2019 01/02/2019 07/02/2018 04/01/2018 12/28/2017  Falls in the past year? 0 0 No No No  Number falls in past yr: 0 0 - - -  Injury with Fall? 0 0 - - -  Risk for fall due to : - Impaired balance/gait - - -  Follow up - Falls prevention discussed - - -    Assessment & Plan  1. Right wrist pain  - Ambulatory referral to Orthopedic Surgery  I discussed the assessment and treatment plan with the patient. The patient was provided an opportunity to  ask questions and all were answered. The patient agreed with the plan and demonstrated an understanding of the instructions.   The patient was advised to call back or seek an  in-person evaluation if the symptoms worsen or if the condition fails to improve as anticipated.  I provided 15  minutes of non-face-to-face time during this encounter.  Loistine Chance, MD

## 2019-05-13 DIAGNOSIS — M19031 Primary osteoarthritis, right wrist: Secondary | ICD-10-CM | POA: Diagnosis not present

## 2019-06-30 ENCOUNTER — Encounter: Payer: Self-pay | Admitting: Family Medicine

## 2019-06-30 ENCOUNTER — Ambulatory Visit (INDEPENDENT_AMBULATORY_CARE_PROVIDER_SITE_OTHER): Payer: Medicare Other | Admitting: Family Medicine

## 2019-06-30 VITALS — BP 133/63 | HR 67 | Temp 98.0°F | Ht 71.0 in | Wt 205.0 lb

## 2019-06-30 DIAGNOSIS — I10 Essential (primary) hypertension: Secondary | ICD-10-CM

## 2019-06-30 DIAGNOSIS — R809 Proteinuria, unspecified: Secondary | ICD-10-CM

## 2019-06-30 DIAGNOSIS — E785 Hyperlipidemia, unspecified: Secondary | ICD-10-CM | POA: Diagnosis not present

## 2019-06-30 DIAGNOSIS — M150 Primary generalized (osteo)arthritis: Secondary | ICD-10-CM

## 2019-06-30 DIAGNOSIS — M8949 Other hypertrophic osteoarthropathy, multiple sites: Secondary | ICD-10-CM

## 2019-06-30 DIAGNOSIS — I739 Peripheral vascular disease, unspecified: Secondary | ICD-10-CM | POA: Diagnosis not present

## 2019-06-30 DIAGNOSIS — M159 Polyosteoarthritis, unspecified: Secondary | ICD-10-CM

## 2019-06-30 MED ORDER — HYDROCHLOROTHIAZIDE 12.5 MG PO TABS: 12.5000 mg | ORAL_TABLET | Freq: Every day | ORAL | 1 refills | Status: DC

## 2019-06-30 MED ORDER — AMLODIPINE BESYLATE 10 MG PO TABS: 10.0000 mg | ORAL_TABLET | Freq: Every day | ORAL | 1 refills | Status: DC

## 2019-06-30 MED ORDER — OMEGA-3-ACID ETHYL ESTERS 1 G PO CAPS: 2.0000 g | ORAL_CAPSULE | Freq: Two times a day (BID) | ORAL | 1 refills | Status: DC

## 2019-06-30 MED ORDER — ATORVASTATIN CALCIUM 40 MG PO TABS: 40.0000 mg | ORAL_TABLET | Freq: Every day | ORAL | 1 refills | Status: DC

## 2019-06-30 MED ORDER — LOSARTAN POTASSIUM 50 MG PO TABS: 50.0000 mg | ORAL_TABLET | Freq: Every day | ORAL | 1 refills | Status: DC

## 2019-06-30 NOTE — Progress Notes (Signed)
Name: Frank Hamilton   MRN: [id number]    DOB: [birth year]   Date:06/30/2019       Progress Note  Subjective  Chief Complaint  Chief Complaint  Patient presents with  . Gastroesophageal Reflux    Occasionally has heartburn symptoms  . Hypertension    Denies any symptoms  . Medication Refill  . Dyslipidemia    I connected with  Frank Hamilton on 06/30/19 at 10:40 AM EDT by telephone and verified that I am speaking with the correct person using two identifiers.  I discussed the limitations, risks, security and privacy concerns of performing an evaluation and management service by telephone and the availability of in person appointments. Staff also discussed with the patient that there may be a patient responsible charge related to this service. Patient Location: at home Provider Location: [facility]   HPI  HTN: taking medication and denies side effects, no chest pain or palpitation.BP at home today was at goal,. Macro albuminuria and is seeing Dr. Abigail Butts, on ARB, HCTZ and Norvasc   Dyslipidemia:atorvastatin and lovaza  since triglycerides was  over 500, also taking  aspirin daily . He denies side effects of medication, he will return for labs next month   GERD: indigestion at night, taking Tums about once or twice a week, unchanged   OA: mostly on hands taking otc glucosamine, not on nsaid's except at most once a week takes Naproxen when very active. Seen by Ortho a couple of months ago, x-ray just confirmed OA, intermittent pain   PVD with claudication: mildly abnormal ABI he had PT ,  he can walk down his driveway - 2/3 mile without having to stop, discussed trying to extend distance slowly.   History of left kidney cyst : that went down in size from 2014 to 2015 and per studies stable, no hematuria.No complaints   Patient Active Problem List   Diagnosis Date Noted  . Positive for macroalbuminuria 08/06/2018  . PVD (peripheral vascular disease)  with claudication ([place]) 07/02/2018  . Senile purpura ([place]) 07/02/2018  . Claudication ([place]) 04/23/2018  . Vitamin D deficiency 05/02/2016  . Osteoarthritis of both hands 05/01/2016  . Hypertension, benign 08/03/2015  . Hyperlipidemia 08/03/2015  . Kidney cysts 12/15/2013    Past Surgical History:  Procedure Laterality Date  . bowel obstruction  2011  . HERNIA REPAIR  2009 and 2010  . TONSILLECTOMY     age 35 or 86    Family History  Problem Relation Age of Onset  . Heart disease Mother   . Stroke Father   . Heart disease Sister   . Diabetes Sister   . Diabetes Maternal Grandfather     Social History   Socioeconomic History  . Marital status: Widowed    Spouse name: Not on file  . Number of children: 3  . Years of education: Not on file  . Highest education level: High school graduate  Occupational History  . Occupation: retired  Scientific laboratory technician  . Financial resource strain: Not hard at all  . Food insecurity    Worry: Never true    Inability: Never true  . Transportation needs    Medical: No    Non-medical: No  Tobacco Use  . Smoking status: Former Smoker    Packs/day: 1.00    Years: 20.00    Pack years: 20.00    Types: Cigarettes    Quit date: 06/08/1974    Years since quitting: 45.0  . Smokeless  tobacco: Never Used  Substance and Sexual Activity  . Alcohol use: Yes    Alcohol/week: 1.0 standard drinks    Types: 1 Cans of beer per week  . Drug use: No  . Sexual activity: Not Currently  Lifestyle  . Physical activity    Days per week: 7 days    Minutes per session: 30 min  . Stress: Only a little  Relationships  . Social connections    Talks on phone: More than three times a week    Gets together: Twice a week    Attends religious service: Never    Active member of club or organization: No    Attends meetings of clubs or organizations: Never    Relationship status: Widowed  . Intimate partner violence    Fear of current or ex partner: No     Emotionally abused: No    Physically abused: No    Forced sexual activity: No  Other Topics Concern  . Not on file  Social History Narrative   He was married for 56 years , wife died in February 28, 2016. Lives with his dog     Current Outpatient Medications:  .  amLODipine (NORVASC) 10 MG tablet, Take 1 tablet (10 mg total) by mouth daily., Disp: 90 tablet, Rfl: 1 .  aspirin 81 MG chewable tablet, Chew 81 mg by mouth., Disp: , Rfl:  .  atorvastatin (LIPITOR) 40 MG tablet, Take 1 tablet (40 mg total) by mouth daily., Disp: 90 tablet, Rfl: 1 .  Cholecalciferol (VITAMIN D) 2000 units CAPS, Take 1 capsule (2,000 Units total) by mouth daily., Disp: 30 capsule, Rfl:  .  glucosamine-chondroitin 500-400 MG tablet, Take 1 tablet by mouth 3 (three) times daily., Disp: , Rfl:  .  hydrochlorothiazide (HYDRODIURIL) 12.5 MG tablet, Take 1 tablet (12.5 mg total) by mouth daily., Disp: 90 tablet, Rfl: 3 .  losartan (COZAAR) 50 MG tablet, Take 50 mg by mouth daily., Disp: , Rfl:  .  omega-3 acid ethyl esters (LOVAZA) 1 g capsule, Take 2 capsules (2 g total) by mouth 2 (two) times daily., Disp: 360 capsule, Rfl: 1  Allergies  Allergen Reactions  . Sulfa Antibiotics Itching and Rash    I personally reviewed active problem list, medication list, allergies, family history, social history with the patient/caregiver today.   ROS  Ten systems reviewed and is negative except as mentioned in HPI    Objective  Virtual encounter, vitals obtained at home  Vitals:   06/30/19 1052  BP: 133/63  Pulse: 67  Temp: 98 F (36.7 C)    Body mass index is 28.59 kg/m.  Physical Exam  Awake, alert and oriented  PHQ2/9: Depression screen [facility] 2/9 06/30/2019 05/02/2019 02/18/2019 01/02/2019 07/02/2018  Decreased Interest 0 0 0 0 0  Down, Depressed, Hopeless 0 0 0 0 0  PHQ - 2 Score 0 0 0 0 0  Altered sleeping 0 0 0 - -  Tired, decreased energy 0 0 0 - -  Change in appetite 0 0 0 - -  Feeling bad or failure about  yourself  0 0 0 - -  Trouble concentrating 0 0 0 - -  Moving slowly or fidgety/restless 0 0 0 - -  Suicidal thoughts 0 0 0 - -  PHQ-9 Score 0 0 0 - -  Difficult doing work/chores - - Not difficult at all - -   PHQ-2/9 Result is negative.    Fall Risk: Fall Risk  06/30/2019 05/02/2019 01/02/2019 07/02/2018  04/01/2018  Falls in the past year? 0 0 0 No No  Number falls in past yr: 0 0 0 - -  Injury with Fall? 0 0 0 - -  Risk for fall due to : - - Impaired balance/gait - -  Follow up - - Falls prevention discussed - -     Assessment & Plan    1. Essential hypertension  - amLODipine (NORVASC) 10 MG tablet; Take 1 tablet (10 mg total) by mouth daily.  Dispense: 90 tablet; Refill: 1 - hydrochlorothiazide (HYDRODIURIL) 12.5 MG tablet; Take 1 tablet (12.5 mg total) by mouth daily.  Dispense: 90 tablet; Refill: 1 - COMPLETE METABOLIC PANEL WITH GFR - CBC with Differential/Platelet  2. Dyslipidemia  - atorvastatin (LIPITOR) 40 MG tablet; Take 1 tablet (40 mg total) by mouth daily.  Dispense: 90 tablet; Refill: 1 - omega-3 acid ethyl esters (LOVAZA) 1 g capsule; Take 2 capsules (2 g total) by mouth 2 (two) times daily.  Dispense: 360 capsule; Refill: 1 - COMPLETE METABOLIC PANEL WITH GFR - Lipid panel  3. PVD (peripheral vascular disease) with claudication ([place])  He needs to increase physical activity   4. Albuminuria  Under the care of Dr. Abigail Butts   5. Primary osteoarthritis involving multiple joints   I discussed the assessment and treatment plan with the patient. The patient was provided an opportunity to ask questions and all were answered. The patient agreed with the plan and demonstrated an understanding of the instructions.   The patient was advised to call back or seek an in-person evaluation if the symptoms worsen or if the condition fails to improve as anticipated.  I provided 25 minutes of non-face-to-face time during this encounter.  Loistine Chance, MD

## 2019-08-14 ENCOUNTER — Ambulatory Visit: Payer: Medicare Other

## 2019-09-08 ENCOUNTER — Other Ambulatory Visit: Payer: Self-pay

## 2019-09-08 ENCOUNTER — Ambulatory Visit (INDEPENDENT_AMBULATORY_CARE_PROVIDER_SITE_OTHER): Payer: Medicare Other

## 2019-09-08 VITALS — BP 142/68

## 2019-09-08 DIAGNOSIS — Z23 Encounter for immunization: Secondary | ICD-10-CM | POA: Diagnosis not present

## 2019-09-08 DIAGNOSIS — E785 Hyperlipidemia, unspecified: Secondary | ICD-10-CM | POA: Diagnosis not present

## 2019-09-08 DIAGNOSIS — I10 Essential (primary) hypertension: Secondary | ICD-10-CM | POA: Diagnosis not present

## 2019-09-09 LAB — CBC WITH DIFFERENTIAL/PLATELET
Absolute Monocytes: 583 cells/uL (ref 200–950)
Basophils Absolute: 27 cells/uL (ref 0–200)
Basophils Relative: 0.4 %
Eosinophils Absolute: 503 cells/uL — ABNORMAL HIGH (ref 15–500)
Eosinophils Relative: 7.5 %
HCT: 39.8 % (ref 38.5–50.0)
Hemoglobin: 13.7 g/dL (ref 13.2–17.1)
Lymphs Abs: 2104 cells/uL (ref 850–3900)
MCH: 30.8 pg (ref 27.0–33.0)
MCHC: 34.4 g/dL (ref 32.0–36.0)
MCV: 89.4 fL (ref 80.0–100.0)
MPV: 10 fL (ref 7.5–12.5)
Monocytes Relative: 8.7 %
Neutro Abs: 3484 cells/uL (ref 1500–7800)
Neutrophils Relative %: 52 %
Platelets: 191 10*3/uL (ref 140–400)
RBC: 4.45 10*6/uL (ref 4.20–5.80)
RDW: 13.7 % (ref 11.0–15.0)
Total Lymphocyte: 31.4 %
WBC: 6.7 10*3/uL (ref 3.8–10.8)

## 2019-09-09 LAB — COMPLETE METABOLIC PANEL WITH GFR
AG Ratio: 2.4 (calc) (ref 1.0–2.5)
ALT: 30 U/L (ref 9–46)
AST: 26 U/L (ref 10–35)
Albumin: 4.6 g/dL (ref 3.6–5.1)
Alkaline phosphatase (APISO): 48 U/L (ref 35–144)
BUN: 15 mg/dL (ref 7–25)
CO2: 26 mmol/L (ref 20–32)
Calcium: 9.2 mg/dL (ref 8.6–10.3)
Chloride: 107 mmol/L (ref 98–110)
Creat: 0.7 mg/dL (ref 0.70–1.18)
GFR, Est African American: 106 mL/min/{1.73_m2} (ref >=60)
GFR, Est Non African American: 92 mL/min/{1.73_m2} (ref >=60)
Globulin: 1.9 g/dL (calc) (ref 1.9–3.7)
Glucose, Bld: 94 mg/dL (ref 65–99)
Potassium: 4.1 mmol/L (ref 3.5–5.3)
Sodium: 142 mmol/L (ref 135–146)
Total Bilirubin: 0.6 mg/dL (ref 0.2–1.2)
Total Protein: 6.5 g/dL (ref 6.1–8.1)

## 2019-09-09 LAB — LIPID PANEL
Cholesterol: 100 mg/dL (ref <200)
HDL: 28 mg/dL — ABNORMAL LOW (ref >=40)
LDL Cholesterol (Calc): 49 mg/dL (calc)
Non-HDL Cholesterol (Calc): 72 mg/dL (calc) (ref <130)
Total CHOL/HDL Ratio: 3.6 (calc) (ref <5.0)
Triglycerides: 146 mg/dL (ref <150)

## 2019-12-12 ENCOUNTER — Telehealth: Payer: Self-pay | Admitting: Cardiovascular Disease

## 2019-12-12 NOTE — Telephone Encounter (Signed)
Patient has been doing well and does not wish to follow up  Deleted recall

## 2020-01-02 ENCOUNTER — Encounter: Payer: Self-pay | Admitting: Family Medicine

## 2020-01-02 ENCOUNTER — Ambulatory Visit (INDEPENDENT_AMBULATORY_CARE_PROVIDER_SITE_OTHER): Payer: Medicare Other | Admitting: Family Medicine

## 2020-01-02 VITALS — BP 133/61 | HR 66 | Ht 71.0 in | Wt 202.0 lb

## 2020-01-02 DIAGNOSIS — E559 Vitamin D deficiency, unspecified: Secondary | ICD-10-CM

## 2020-01-02 DIAGNOSIS — E785 Hyperlipidemia, unspecified: Secondary | ICD-10-CM | POA: Diagnosis not present

## 2020-01-02 DIAGNOSIS — M8949 Other hypertrophic osteoarthropathy, multiple sites: Secondary | ICD-10-CM

## 2020-01-02 DIAGNOSIS — I10 Essential (primary) hypertension: Secondary | ICD-10-CM

## 2020-01-02 DIAGNOSIS — I739 Peripheral vascular disease, unspecified: Secondary | ICD-10-CM

## 2020-01-02 DIAGNOSIS — D692 Other nonthrombocytopenic purpura: Secondary | ICD-10-CM

## 2020-01-02 MED ORDER — ATORVASTATIN CALCIUM 40 MG PO TABS: 40.0000 mg | ORAL_TABLET | Freq: Every day | ORAL | 1 refills | Status: DC

## 2020-01-02 MED ORDER — LOSARTAN POTASSIUM 50 MG PO TABS: 50.0000 mg | ORAL_TABLET | Freq: Every day | ORAL | 1 refills | Status: DC

## 2020-01-02 MED ORDER — AMLODIPINE BESYLATE 10 MG PO TABS: 10.0000 mg | ORAL_TABLET | Freq: Every day | ORAL | 1 refills | Status: DC

## 2020-01-02 MED ORDER — OMEGA-3-ACID ETHYL ESTERS 1 G PO CAPS: 2.0000 g | ORAL_CAPSULE | Freq: Two times a day (BID) | ORAL | 1 refills | Status: DC

## 2020-01-02 MED ORDER — HYDROCHLOROTHIAZIDE 12.5 MG PO TABS: 12.5000 mg | ORAL_TABLET | Freq: Every day | ORAL | 1 refills | Status: DC

## 2020-01-02 NOTE — Progress Notes (Signed)
Name: Frank Hamilton   MRN: [id number]    DOB: 1941/04/09   Date:01/02/2020       Progress Note  Subjective  Chief Complaint  Chief Complaint  Patient presents with  . Medication Refill  . Hypertension    Denies any symptoms  . Gastroesophageal Reflux    Minimal discomfort when he over eats  . Dyslipidemia    I connected with  Frank Hamilton  on 01/02/20 at 10:20 AM EST by teleophone, and verified that I am speaking with the correct person using two identifiers.  I discussed the limitations of evaluation and management by telemedicine and the availability of in person appointments. The patient expressed understanding and agreed to proceed. Staff also discussed with the patient that there may be a patient responsible charge related to this service. Patient Location: at home Provider Location: [facility]   HPI  HTN: taking medication and denies side effects, no chest pain or palpitation.BPat home today was at goal. He has seen  Dr. Abigail Butts in the past but not recently . He is  on ARB, HCTZ and Norvasc   Dyslipidemia: Atorvastatinand lovazasince triglycerideswasover 500, also taking aspirin daily . He denies side effects of medication, reviewed labs. Continue medication. Triglycerides is much better, HDL is still low, LDL is at goal down to 49   GERD: he states he is avoiding eating late at night and symptoms are better controlled. Not on daily medication He also avoids eating trigger meals.  OA: mostly on hands taking otc glucosamine, not on nsaid's except at most once a week takes Naproxen when very active.He saw Ortho in 2020 and was given reassurance  PVD with claudication: mildly abnormal ABI he had PT ,  he can walk down his driveway - 2/3 mile without having to stop, discussed trying to extend distance slowly, but he still walking about the same distance. He is active in his yard, tends to his yard .   History of left kidney cyst : that went  down in size from 2014 to 2015 and per studies stable, no hematuria or flank pain   Patient Active Problem List   Diagnosis Date Noted  . Positive for macroalbuminuria 08/06/2018  . PVD (peripheral vascular disease) with claudication ([place]) 07/02/2018  . Senile purpura ([place]) 07/02/2018  . Claudication ([place]) 04/23/2018  . Vitamin D deficiency 05/02/2016  . Osteoarthritis of both hands 05/01/2016  . Hypertension, benign 08/03/2015  . Hyperlipidemia 08/03/2015  . Kidney cysts 12/15/2013    Past Surgical History:  Procedure Laterality Date  . bowel obstruction  2011  . HERNIA REPAIR  2009 and 2010  . TONSILLECTOMY     age 61 or 78    Family History  Problem Relation Age of Onset  . Heart disease Mother   . Stroke Father   . Heart disease Sister   . Diabetes Sister   . Diabetes Maternal Grandfather     Social History   Socioeconomic History  . Marital status: Widowed    Spouse name: Not on file  . Number of children: 3  . Years of education: Not on file  . Highest education level: High school graduate  Occupational History  . Occupation: retired  Tobacco Use  . Smoking status: Former Smoker    Packs/day: 1.00    Years: 20.00    Pack years: 20.00    Types: Cigarettes    Quit date: 06/08/1974    Years since quitting: 45.6  . Smokeless  tobacco: Never Used  Substance and Sexual Activity  . Alcohol use: Yes    Alcohol/week: 1.0 standard drinks    Types: 1 Cans of beer per week  . Drug use: No  . Sexual activity: Not Currently  Other Topics Concern  . Not on file  Social History Narrative   He was married for 22 years , wife died in Feb 24, 2016. Lives with his dog   Social Determinants of Health   Financial Resource Strain: Low Risk   . Difficulty of Paying Living Expenses: Not hard at all  Food Insecurity: No Food Insecurity  . Worried About Charity fundraiser in the Last Year: Never true  . Ran Out of Food in the Last Year: Never true  Transportation Needs: No  Transportation Needs  . Lack of Transportation (Medical): No  . Lack of Transportation (Non-Medical): No  Physical Activity: Sufficiently Active  . Days of Exercise per Week: 7 days  . Minutes of Exercise per Session: 30 min  Stress: No Stress Concern Present  . Feeling of Stress : Only a little  Social Connections: Moderately Isolated  . Frequency of Communication with Friends and Family: More than three times a week  . Frequency of Social Gatherings with Friends and Family: Twice a week  . Attends Religious Services: Never  . Active Member of Clubs or Organizations: No  . Attends Archivist Meetings: Never  . Marital Status: Widowed  Intimate Partner Violence: Not At Risk  . Fear of Current or Ex-Partner: No  . Emotionally Abused: No  . Physically Abused: No  . Sexually Abused: No     Current Outpatient Medications:  .  amLODipine (NORVASC) 10 MG tablet, Take 1 tablet (10 mg total) by mouth daily., Disp: 90 tablet, Rfl: 1 .  aspirin 81 MG chewable tablet, Chew 81 mg by mouth., Disp: , Rfl:  .  atorvastatin (LIPITOR) 40 MG tablet, Take 1 tablet (40 mg total) by mouth daily., Disp: 90 tablet, Rfl: 1 .  Cholecalciferol (VITAMIN D) 2000 units CAPS, Take 1 capsule (2,000 Units total) by mouth daily., Disp: 30 capsule, Rfl:  .  glucosamine-chondroitin 500-400 MG tablet, Take 1 tablet by mouth 3 (three) times daily., Disp: , Rfl:  .  hydrochlorothiazide (HYDRODIURIL) 12.5 MG tablet, Take 1 tablet (12.5 mg total) by mouth daily., Disp: 90 tablet, Rfl: 1 .  losartan (COZAAR) 50 MG tablet, Take 1 tablet (50 mg total) by mouth daily., Disp: 90 tablet, Rfl: 1 .  omega-3 acid ethyl esters (LOVAZA) 1 g capsule, Take 2 capsules (2 g total) by mouth 2 (two) times daily., Disp: 360 capsule, Rfl: 1  Allergies  Allergen Reactions  . Sulfa Antibiotics Itching and Rash    I personally reviewed active problem list, medication list, allergies, family history, social history with the  patient/caregiver today.   ROS  Ten systems reviewed and is negative except as mentioned in HPI   Objective  Virtual encounter, vitals  Obtained at home  Today's Vitals   01/02/20 1203  BP: 133/61  Pulse: 66  Weight: 202 lb (91.6 kg)  Height: 5\' 11"  (1.803 m)   Body mass index is 28.17 kg/m.   Physical Exam  Awake, alert and oriented   PHQ2/9: Depression screen [facility] 2/9 01/02/2020 06/30/2019 05/02/2019 02/18/2019 01/02/2019  Decreased Interest 0 0 0 0 0  Down, Depressed, Hopeless 0 0 0 0 0  PHQ - 2 Score 0 0 0 0 0  Altered sleeping 0 0 0  0 -  Tired, decreased energy 0 0 0 0 -  Change in appetite 0 0 0 0 -  Feeling bad or failure about yourself  0 0 0 0 -  Trouble concentrating 0 0 0 0 -  Moving slowly or fidgety/restless 0 0 0 0 -  Suicidal thoughts 0 0 0 0 -  PHQ-9 Score 0 0 0 0 -  Difficult doing work/chores Not difficult at all - - Not difficult at all -   PHQ-2/9 Result is negative.    Fall Risk: Fall Risk  01/02/2020 06/30/2019 05/02/2019 01/02/2019 07/02/2018  Falls in the past year? 0 0 0 0 No  Number falls in past yr: 0 0 0 0 -  Injury with Fall? 0 0 0 0 -  Risk for fall due to : - - - Impaired balance/gait -  Follow up - - - Falls prevention discussed -     Assessment & Plan  1. PVD (peripheral vascular disease) with claudication ([place])  Doing well   2. Essential hypertension  - amLODipine (NORVASC) 10 MG tablet; Take 1 tablet (10 mg total) by mouth daily.  Dispense: 90 tablet; Refill: 1 - hydrochlorothiazide (HYDRODIURIL) 12.5 MG tablet; Take 1 tablet (12.5 mg total) by mouth daily.  Dispense: 90 tablet; Refill: 1  3. Dyslipidemia  - atorvastatin (LIPITOR) 40 MG tablet; Take 1 tablet (40 mg total) by mouth daily.  Dispense: 90 tablet; Refill: 1 - omega-3 acid ethyl esters (LOVAZA) 1 g capsule; Take 2 capsules (2 g total) by mouth 2 (two) times daily.  Dispense: 360 capsule; Refill: 1  4. Primary osteoarthritis involving multiple  joints  stable  5. Vitamin D deficiency  Continue  supplementation   6. Senile purpura ([place])  No problems  I discussed the assessment and treatment plan with the patient. The patient was provided an opportunity to ask questions and all were answered. The patient agreed with the plan and demonstrated an understanding of the instructions.  The patient was advised to call back or seek an in-person evaluation if the symptoms worsen or if the condition fails to improve as anticipated.  I provided 25  minutes of non-face-to-face time during this encounter.

## 2020-01-08 ENCOUNTER — Ambulatory Visit: Payer: Medicare Other

## 2020-01-09 ENCOUNTER — Ambulatory Visit (INDEPENDENT_AMBULATORY_CARE_PROVIDER_SITE_OTHER): Payer: Medicare Other

## 2020-01-09 VITALS — BP 146/61 | HR 59 | Ht 71.0 in | Wt 202.0 lb

## 2020-01-09 DIAGNOSIS — Z0000 Encounter for general adult medical examination without abnormal findings: Secondary | ICD-10-CM | POA: Diagnosis not present

## 2020-01-09 NOTE — Patient Instructions (Signed)
Frank Hamilton , Thank you for taking time to come for your Medicare Wellness Visit. I appreciate your ongoing commitment to your health goals. Please review the following plan we discussed and let me know if I can assist you in the future.   Screening recommendations/referrals: Colonoscopy: no longer required Recommended yearly ophthalmology/optometry visit for glaucoma screening and checkup Recommended yearly dental visit for hygiene and checkup  Vaccinations: Influenza vaccine: done 09/08/19 Pneumococcal vaccine: done 04/01/18 Tdap vaccine: done 08/15/11  Shingles vaccine: Shingrix discussed. Please contact your pharmacy for coverage information.  Covid-19: Completed 01/08/20  Advanced directives: Advance directive discussed with you today. I have provided a copy for you to complete at home and have notarized. Once this is complete please bring a copy in to our office so we can scan it into your chart.  Conditions/risks identified: Keep up the great work!  Next appointment: Please follow up in one year for your Medicare Annual Wellness visit.    Preventive Care 33 Years and Older, Male Preventive care refers to lifestyle choices and visits with your health care provider that can promote health and wellness. What does preventive care include?  A yearly physical exam. This is also called an annual well check.  Dental exams once or twice a year.  Routine eye exams. Ask your health care provider how often you should have your eyes checked.  Personal lifestyle choices, including:  Daily care of your teeth and gums.  Regular physical activity.  Eating a healthy diet.  Avoiding tobacco and drug use.  Limiting alcohol use.  Practicing safe sex.  Taking low doses of aspirin every day.  Taking vitamin and mineral supplements as recommended by your health care provider. What happens during an annual well check? The services and screenings done by your health care provider during  your annual well check will depend on your age, overall health, lifestyle risk factors, and family history of disease. Counseling  Your health care provider may ask you questions about your:  Alcohol use.  Tobacco use.  Drug use.  Emotional well-being.  Home and relationship well-being.  Sexual activity.  Eating habits.  History of falls.  Memory and ability to understand (cognition).  Work and work Statistician. Screening  You may have the following tests or measurements:  Height, weight, and BMI.  Blood pressure.  Lipid and cholesterol levels. These may be checked every 5 years, or more frequently if you are over 53 years old.  Skin check.  Lung cancer screening. You may have this screening every year starting at age 75 if you have a 30-pack-year history of smoking and currently smoke or have quit within the past 15 years.  Fecal occult blood test (FOBT) of the stool. You may have this test every year starting at age 66.  Flexible sigmoidoscopy or colonoscopy. You may have a sigmoidoscopy every 5 years or a colonoscopy every 10 years starting at age 69.  Prostate cancer screening. Recommendations will vary depending on your family history and other risks.  Hepatitis C blood test.  Hepatitis B blood test.  Sexually transmitted disease (STD) testing.  Diabetes screening. This is done by checking your blood sugar (glucose) after you have not eaten for a while (fasting). You may have this done every 1-3 years.  Abdominal aortic aneurysm (AAA) screening. You may need this if you are a current or former smoker.  Osteoporosis. You may be screened starting at age 39 if you are at high risk. Talk with your health  care provider about your test results, treatment options, and if necessary, the need for more tests. Vaccines  Your health care provider may recommend certain vaccines, such as:  Influenza vaccine. This is recommended every year.  Tetanus, diphtheria, and  acellular pertussis (Tdap, Td) vaccine. You may need a Td booster every 10 years.  Zoster vaccine. You may need this after age 16.  Pneumococcal 13-valent conjugate (PCV13) vaccine. One dose is recommended after age 35.  Pneumococcal polysaccharide (PPSV23) vaccine. One dose is recommended after age 65. Talk to your health care provider about which screenings and vaccines you need and how often you need them. This information is not intended to replace advice given to you by your health care provider. Make sure you discuss any questions you have with your health care provider. Document Released: 11/26/2015 Document Revised: 07/19/2016 Document Reviewed: 08/31/2015 Elsevier Interactive Patient Education  2017 [place] Prevention in the Home Falls can cause injuries. They can happen to people of all ages. There are many things you can do to make your home safe and to help prevent falls. What can I do on the outside of my home?  Regularly fix the edges of walkways and driveways and fix any cracks.  Remove anything that might make you trip as you walk through a door, such as a raised step or threshold.  Trim any bushes or trees on the path to your home.  Use bright outdoor lighting.  Clear any walking paths of anything that might make someone trip, such as rocks or tools.  Regularly check to see if handrails are loose or broken. Make sure that both sides of any steps have handrails.  Any raised decks and porches should have guardrails on the edges.  Have any leaves, snow, or ice cleared regularly.  Use sand or salt on walking paths during winter.  Clean up any spills in your garage right away. This includes oil or grease spills. What can I do in the bathroom?  Use night lights.  Install grab bars by the toilet and in the tub and shower. Do not use towel bars as grab bars.  Use non-skid mats or decals in the tub or shower.  If you need to sit down in the shower, use  a plastic, non-slip stool.  Keep the floor dry. Clean up any water that spills on the floor as soon as it happens.  Remove soap buildup in the tub or shower regularly.  Attach bath mats securely with double-sided non-slip rug tape.  Do not have throw rugs and other things on the floor that can make you trip. What can I do in the bedroom?  Use night lights.  Make sure that you have a light by your bed that is easy to reach.  Do not use any sheets or blankets that are too big for your bed. They should not hang down onto the floor.  Have a firm chair that has side arms. You can use this for support while you get dressed.  Do not have throw rugs and other things on the floor that can make you trip. What can I do in the kitchen?  Clean up any spills right away.  Avoid walking on wet floors.  Keep items that you use a lot in easy-to-reach places.  If you need to reach something above you, use a strong step stool that has a grab bar.  Keep electrical cords out of the way.  Do not use floor  polish or wax that makes floors slippery. If you must use wax, use non-skid floor wax.  Do not have throw rugs and other things on the floor that can make you trip. What can I do with my stairs?  Do not leave any items on the stairs.  Make sure that there are handrails on both sides of the stairs and use them. Fix handrails that are broken or loose. Make sure that handrails are as long as the stairways.  Check any carpeting to make sure that it is firmly attached to the stairs. Fix any carpet that is loose or worn.  Avoid having throw rugs at the top or bottom of the stairs. If you do have throw rugs, attach them to the floor with carpet tape.  Make sure that you have a light switch at the top of the stairs and the bottom of the stairs. If you do not have them, ask someone to add them for you. What else can I do to help prevent falls?  Wear shoes that:  Do not have high heels.  Have  rubber bottoms.  Are comfortable and fit you well.  Are closed at the toe. Do not wear sandals.  If you use a stepladder:  Make sure that it is fully opened. Do not climb a closed stepladder.  Make sure that both sides of the stepladder are locked into place.  Ask someone to hold it for you, if possible.  Clearly mark and make sure that you can see:  Any grab bars or handrails.  First and last steps.  Where the edge of each step is.  Use tools that help you move around (mobility aids) if they are needed. These include:  Canes.  Walkers.  Scooters.  Crutches.  Turn on the lights when you go into a dark area. Replace any light bulbs as soon as they burn out.  Set up your furniture so you have a clear path. Avoid moving your furniture around.  If any of your floors are uneven, fix them.  If there are any pets around you, be aware of where they are.  Review your medicines with your doctor. Some medicines can make you feel dizzy. This can increase your chance of falling. Ask your doctor what other things that you can do to help prevent falls. This information is not intended to replace advice given to you by your health care provider. Make sure you discuss any questions you have with your health care provider. Document Released: 08/26/2009 Document Revised: 04/06/2016 Document Reviewed: 12/04/2014 Elsevier Interactive Patient Education  2017 [employer].

## 2020-01-09 NOTE — Progress Notes (Signed)
Subjective:   Frank Hamilton is a 79 y.o. male who presents for Medicare Annual/Subsequent preventive examination.  Virtual Visit via Telephone Note  I connected with Frank Hamilton on 01/09/20 at 11:20 AM EST by telephone and verified that I am speaking with the correct person using two identifiers.  Medicare Annual Wellness visit completed telephonically due to Covid-19 pandemic.   Location: Patient: home Provider: office   I discussed the limitations, risks, security and privacy concerns of performing an evaluation and management service by telephone and the availability of in person appointments. The patient expressed understanding and agreed to proceed.  Some vital signs may be absent or patient reported.   Clemetine Marker, LPN    Review of Systems:   Cardiac Risk Factors include: advanced age (>78men, >54 women);hypertension;male gender;dyslipidemia     Objective:    Vitals: BP (!) 146/61   Pulse (!) 59   Ht 5\' 11"  (1.803 m)   Wt 202 lb (91.6 kg)   BMI 28.17 kg/m   Body mass index is 28.17 kg/m.  Advanced Directives 01/09/2020 01/02/2019 04/17/2018 06/27/2017 03/19/2017 02/14/2017 11/15/2016  Does Patient Have a Medical Advance Directive? No No No No No No No  Would patient like information on creating a medical advance directive? Yes (MAU/Ambulatory/Procedural Areas - Information given) No - Patient declined No - Patient declined - - - -    Tobacco Social History   Tobacco Use  Smoking Status Former Smoker  . Packs/day: 1.00  . Years: 20.00  . Pack years: 20.00  . Types: Cigarettes  . Quit date: 06/08/1974  . Years since quitting: 45.6  Smokeless Tobacco Never Used     Counseling given: Not Answered   Clinical Intake:  Pre-visit preparation completed: Yes  Pain : No/denies pain     BMI - recorded: 28.17 Nutritional Status: BMI 25 -29 Overweight Nutritional Risks: None Diabetes: No  How often do you need to have someone help you when you read  instructions, pamphlets, or other written materials from your doctor or pharmacy?: 1 - Never  Interpreter Needed?: No  Information entered by :: Clemetine Marker LPN  Past Medical History:  Diagnosis Date  . Hyperlipidemia   . Hypertension   . Kidney cysts   . Osteoarthritis   . PVD (peripheral vascular disease) ([place])   . Vitamin D deficiency    Past Surgical History:  Procedure Laterality Date  . bowel obstruction  2011  . HERNIA REPAIR  2009 and 2010  . TONSILLECTOMY     age 38 or 60   Family History  Problem Relation Age of Onset  . Heart disease Mother   . Stroke Father   . Heart disease Sister   . Diabetes Sister   . Diabetes Maternal Grandfather    Social History   Socioeconomic History  . Marital status: Widowed    Spouse name: Not on file  . Number of children: 3  . Years of education: Not on file  . Highest education level: High school graduate  Occupational History  . Occupation: retired  Tobacco Use  . Smoking status: Former Smoker    Packs/day: 1.00    Years: 20.00    Pack years: 20.00    Types: Cigarettes    Quit date: 06/08/1974    Years since quitting: 45.6  . Smokeless tobacco: Never Used  Substance and Sexual Activity  . Alcohol use: Yes    Alcohol/week: 1.0 standard drinks    Types: 1 Cans of  beer per week  . Drug use: No  . Sexual activity: Not Currently  Other Topics Concern  . Not on file  Social History Narrative   He was married for 70 years , wife died in 2016/02/28. Lives with his dog   Social Determinants of Health   Financial Resource Strain: Low Risk   . Difficulty of Paying Living Expenses: Not hard at all  Food Insecurity: No Food Insecurity  . Worried About Charity fundraiser in the Last Year: Never true  . Ran Out of Food in the Last Year: Never true  Transportation Needs: No Transportation Needs  . Lack of Transportation (Medical): No  . Lack of Transportation (Non-Medical): No  Physical Activity: Sufficiently Active  . Days  of Exercise per Week: 7 days  . Minutes of Exercise per Session: 30 min  Stress: No Stress Concern Present  . Feeling of Stress : Not at all  Social Connections: Moderately Isolated  . Frequency of Communication with Friends and Family: More than three times a week  . Frequency of Social Gatherings with Friends and Family: Twice a week  . Attends Religious Services: Never  . Active Member of Clubs or Organizations: No  . Attends Archivist Meetings: Never  . Marital Status: Widowed    Outpatient Encounter Medications as of 01/09/2020  Medication Sig  . amLODipine (NORVASC) 10 MG tablet Take 1 tablet (10 mg total) by mouth daily.  Marland Kitchen aspirin 81 MG chewable tablet Chew 81 mg by mouth.  Marland Kitchen atorvastatin (LIPITOR) 40 MG tablet Take 1 tablet (40 mg total) by mouth daily.  . Cholecalciferol (VITAMIN D) 2000 units CAPS Take 1 capsule (2,000 Units total) by mouth daily.  Marland Kitchen glucosamine-chondroitin 500-400 MG tablet Take 1 tablet by mouth in the morning and at bedtime.   . hydrochlorothiazide (HYDRODIURIL) 12.5 MG tablet Take 1 tablet (12.5 mg total) by mouth daily.  Marland Kitchen losartan (COZAAR) 50 MG tablet Take 1 tablet (50 mg total) by mouth daily.  Marland Kitchen omega-3 acid ethyl esters (LOVAZA) 1 g capsule Take 2 capsules (2 g total) by mouth 2 (two) times daily.   No facility-administered encounter medications on file as of 01/09/2020.    Activities of Daily Living In your present state of health, do you have any difficulty performing the following activities: 01/09/2020 01/02/2020  Hearing? Y N  Comment declines hearing aids for now -  Vision? N Y  Comment - -  Difficulty concentrating or making decisions? N N  Walking or climbing stairs? N N  Dressing or bathing? N N  Doing errands, shopping? N N  Preparing Food and eating ? N -  Using the Toilet? N -  In the past six months, have you accidently leaked urine? N -  Do you have problems with loss of bowel control? N -  Managing your Medications?  N -  Managing your Finances? N -  Housekeeping or managing your Housekeeping? N -  Some recent data might be hidden    Patient Care Team: Steele Sizer, MD as PCP - General (Family Medicine)   Assessment:   This is a routine wellness examination for Frank Hamilton.  Exercise Activities and Dietary recommendations Current Exercise Habits: Home exercise routine, Type of exercise: walking;stretching, Time (Minutes): 30, Frequency (Times/Week): 7, Weekly Exercise (Minutes/Week): 210, Intensity: Mild, Exercise limited by: None identified  Goals    . Patient Stated     Patient states he would like to remain healthy and continue to improve  gait, balance and strength in legs.       Fall Risk Fall Risk  01/09/2020 01/02/2020 06/30/2019 05/02/2019 01/02/2019  Falls in the past year? 0 0 0 0 0  Number falls in past yr: 0 0 0 0 0  Injury with Fall? 0 0 0 0 0  Risk for fall due to : No Fall Risks - - - Impaired balance/gait  Follow up Falls prevention discussed - - - Falls prevention discussed   FALL RISK PREVENTION PERTAINING TO THE HOME:  Any stairs in or around the home? Yes  If so, do they handrails? Yes   Home free of loose throw rugs in walkways, pet beds, electrical cords, etc? Yes  Adequate lighting in your home to reduce risk of falls? Yes   ASSISTIVE DEVICES UTILIZED TO PREVENT FALLS:  Life alert? No  Use of a cane, walker or w/c? No  Grab bars in the bathroom? No  Shower chair or bench in shower? Yes  Elevated toilet seat or a handicapped toilet? No   DME ORDERS:  DME order needed?  No   TIMED UP AND GO:  Was the test performed? No . Telephonic visit.  Education: Fall risk prevention has been discussed.  Intervention(s) required? No   Depression Screen PHQ 2/9 Scores 01/09/2020 01/02/2020 06/30/2019 05/02/2019  PHQ - 2 Score 0 0 0 0  PHQ- 9 Score - 0 0 0    Cognitive Function     6CIT Screen 01/09/2020 01/02/2019  What Year? 0 points 0 points  What month? 0 points 0  points  What time? 0 points 0 points  Count back from 20 0 points 0 points  Months in reverse 0 points 0 points  Repeat phrase 0 points 0 points  Total Score 0 0    Immunization History  Administered Date(s) Administered  . Fluad Quad(high Dose 65+) 09/08/2019  . Influenza, High Dose Seasonal PF 11/15/2016, 09/27/2017, 10/04/2018  . Influenza,inj,Quad PF,6+ Mos 08/03/2015  . Influenza-Unspecified 09/13/2013  . PFIZER SARS-COV-2 Vaccination 12/18/2019, 01/08/2020  . Pneumococcal Conjugate-13 04/01/2018  . Tdap 08/15/2011    Qualifies for Shingles Vaccine? Yes . Due for Shingrix. Education has been provided regarding the importance of this vaccine. Pt has been advised to call insurance company to determine out of pocket expense. Advised may also receive vaccine at local pharmacy or Health Dept. Verbalized acceptance and understanding.  Tdap: Up to date  Flu Vaccine: Up to date  Pneumococcal Vaccine: Up to date   Screening Tests Health Maintenance  Topic Date Due  . TETANUS/TDAP  08/14/2021  . INFLUENZA VACCINE  Completed  . PNA vac Low Risk Adult  Completed   Cancer Screenings:  Colorectal Screening:  No longer required.   Lung Cancer Screening: (Low Dose CT Chest recommended if Age 46-80 years, 30 pack-year currently smoking OR have quit w/in 15years.) does not qualify.   Additional Screening:  Hepatitis C Screening: no longer required  Vision Screening: Recommended annual ophthalmology exams for early detection of glaucoma and other disorders of the eye. Is the patient up to date with their annual eye exam?  Yes  Who is the provider or what is the name of the office in which the pt attends annual eye exams? [facility]  Dental Screening: Recommended annual dental exams for proper oral hygiene  Community Resource Referral:  CRR required this visit?  No       Plan:    I have personally reviewed and addressed the Medicare Annual Wellness  questionnaire and have noted  the following in the patient's chart:  A. Medical and social history B. Use of alcohol, tobacco or illicit drugs  C. Current medications and supplements D. Functional ability and status E.  Nutritional status F.  Physical activity G. Advance directives H. List of other physicians I.  Hospitalizations, surgeries, and ER visits in previous 12 months J.  [place] such as hearing and vision if needed, cognitive and depression L. Referrals and appointments   In addition, I have reviewed and discussed with patient certain preventive protocols, quality metrics, and best practice recommendations. A written personalized care plan for preventive services as well as general preventive health recommendations were provided to patient.   Signed,  Clemetine Marker, LPN Nurse Health Advisor   Nurse Notes: none

## 2020-01-29 DIAGNOSIS — H40003 Preglaucoma, unspecified, bilateral: Secondary | ICD-10-CM | POA: Diagnosis not present

## 2020-01-29 DIAGNOSIS — H26492 Other secondary cataract, left eye: Secondary | ICD-10-CM | POA: Diagnosis not present

## 2020-03-30 ENCOUNTER — Other Ambulatory Visit: Payer: Self-pay | Admitting: Family Medicine

## 2020-03-30 DIAGNOSIS — I10 Essential (primary) hypertension: Secondary | ICD-10-CM

## 2020-03-31 DIAGNOSIS — H26492 Other secondary cataract, left eye: Secondary | ICD-10-CM | POA: Diagnosis not present

## 2020-05-24 ENCOUNTER — Other Ambulatory Visit: Payer: Self-pay | Admitting: Family Medicine

## 2020-05-24 DIAGNOSIS — E785 Hyperlipidemia, unspecified: Secondary | ICD-10-CM

## 2020-07-05 ENCOUNTER — Other Ambulatory Visit: Payer: Self-pay | Admitting: Family Medicine

## 2020-07-05 DIAGNOSIS — I10 Essential (primary) hypertension: Secondary | ICD-10-CM

## 2020-07-05 NOTE — Telephone Encounter (Signed)
lvm to set up follow up appt

## 2020-08-14 ENCOUNTER — Other Ambulatory Visit: Payer: Self-pay | Admitting: Family Medicine

## 2020-08-14 DIAGNOSIS — I10 Essential (primary) hypertension: Secondary | ICD-10-CM

## 2020-08-14 NOTE — Telephone Encounter (Signed)
Requested Prescriptions  Pending Prescriptions Disp Refills  . hydrochlorothiazide (HYDRODIURIL) 12.5 MG tablet [Pharmacy Med Name: hydroCHLOROthiazide 12.5 MG Oral Tablet] 90 tablet 0    Sig: Take 1 tablet by mouth once daily     Cardiovascular: Diuretics - Thiazide Failed - 08/14/2020  9:32 AM      Failed - Last BP in normal range    BP Readings from Last 1 Encounters:  01/09/20 (!) 146/61         Failed - Valid encounter within last 6 months    Recent Outpatient Visits          7 months ago PVD (peripheral vascular disease) with claudication [facility])   [facility] Steele Sizer, MD   1 year ago PVD (peripheral vascular disease) with claudication [facility])   [facility] Steele Sizer, MD   1 year ago Right wrist pain   [facility] [place], Drue Stager, MD   1 year ago Essential hypertension   [facility] [place], Drue Stager, MD   2 years ago Essential hypertension   [facility] Steele Sizer, MD      Future Appointments            In 1 month Steele Sizer, MD [facility] [place]   In 5 months  [place] in normal range and within 360 days    Calcium  Date Value Ref Range Status  09/08/2019 9.2 8.6 - 10.3 mg/dL Final   Calcium, Total  Date Value Ref Range Status  08/02/2014 8.9 8.5 - 10.1 mg/dL Final         Passed - Cr in normal range and within 360 days    Creat  Date Value Ref Range Status  09/08/2019 0.70 0.70 - 1.18 mg/dL Final    Comment:    For patients >1 years of age, the reference limit for Creatinine is approximately 13% higher for people identified as African-American. .    Creatinine, Urine  Date Value Ref Range Status  08/05/2018 174 20 - 320 mg/dL Final         Passed - K in normal range and within 360 days    Potassium  Date Value Ref Range Status  09/08/2019 4.1 3.5 - 5.3 mmol/L  Final  08/02/2014 3.5 3.5 - 5.1 mmol/L Final         Passed - Na in normal range and within 360 days    Sodium  Date Value Ref Range Status  09/08/2019 142 135 - 146 mmol/L Final  08/04/2015 142 134 - 144 mmol/L Final  08/02/2014 140 136 - 145 mmol/L Final

## 2020-09-15 ENCOUNTER — Ambulatory Visit: Payer: Self-pay | Admitting: Family Medicine

## 2020-09-21 ENCOUNTER — Other Ambulatory Visit: Payer: Self-pay | Admitting: Family Medicine

## 2020-09-21 DIAGNOSIS — I10 Essential (primary) hypertension: Secondary | ICD-10-CM

## 2020-09-21 DIAGNOSIS — E785 Hyperlipidemia, unspecified: Secondary | ICD-10-CM

## 2020-09-21 NOTE — Telephone Encounter (Signed)
Requested Prescriptions  Pending Prescriptions Disp Refills  . atorvastatin (LIPITOR) 40 MG tablet [Pharmacy Med Name: Atorvastatin Calcium 40 MG Oral Tablet] 90 tablet 0    Sig: Take 1 tablet by mouth once daily     Cardiovascular:  Antilipid - Statins Failed - 09/21/2020  7:20 PM      Failed - Total Cholesterol in normal range and within 360 days    Cholesterol, Total  Date Value Ref Range Status  08/04/2015 197 100 - 199 mg/dL Final   Cholesterol  Date Value Ref Range Status  09/08/2019 100 <200 mg/dL Final         Failed - LDL in normal range and within 360 days    LDL Cholesterol (Calc)  Date Value Ref Range Status  09/08/2019 49 mg/dL (calc) Final    Comment:    Reference range: <100 . Desirable range <100 mg/dL for primary prevention;   <70 mg/dL for patients with CHD or diabetic patients  with > or = 2 CHD risk factors. Marland Kitchen LDL-C is now calculated using the Martin-Hopkins  calculation, which is a validated novel method providing  better accuracy than the Friedewald equation in the  estimation of LDL-C.  Cresenciano Genre et al. Annamaria Helling. 8527;782(42): 2061-2068  (http://education.[employer].com/faq/FAQ164)          Failed - HDL in normal range and within 360 days    HDL  Date Value Ref Range Status  09/08/2019 28 (L) > OR = 40 mg/dL Final  08/04/2015 29 (L) >39 mg/dL Final    Comment:    According to ATP-III Guidelines, HDL-C >59 mg/dL is considered a negative risk factor for CHD.          Failed - Triglycerides in normal range and within 360 days    Triglycerides  Date Value Ref Range Status  09/08/2019 146 <150 mg/dL Final         Passed - Patient is not pregnant      Passed - Valid encounter within last 12 months    Recent Outpatient Visits          8 months ago PVD (peripheral vascular disease) with claudication [facility])   [facility] Steele Sizer, MD   1 year ago PVD (peripheral vascular disease) with claudication [facility])   [facility] Sizer, MD   1 year ago Right wrist pain   [facility] Sizer, MD   1 year ago Essential hypertension   [facility] Sizer, MD   2 years ago Essential hypertension   [facility] [place], Drue Stager, MD      Future Appointments            In 1 month Steele Sizer, MD [facility] [place]   In 3 months  [facility]           . amLODipine (NORVASC) 10 MG tablet [Pharmacy Med Name: amLODIPine Besylate 10 MG Oral Tablet] 90 tablet 0    Sig: Take 1 tablet by mouth once daily     Cardiovascular:  Calcium Channel Blockers Failed - 09/21/2020  7:20 PM      Failed - Last BP in normal range    BP Readings from Last 1 Encounters:  01/09/20 (!) 146/61         Failed - Valid encounter within last 6 months    Recent Outpatient Visits  8 months ago PVD (peripheral vascular disease) with claudication [facility])   [facility] Sizer, MD   1 year ago PVD (peripheral vascular disease) with claudication [facility])   [facility] Sizer, MD   1 year ago Right wrist pain   [facility] Sizer, MD   1 year ago Essential hypertension   [facility] Sizer, MD   2 years ago Essential hypertension   [facility] Steele Sizer, MD      Future Appointments            In 1 month Ancil Boozer, Drue Stager, MD [facility], [place]   In 3 months  [place] [facility] [place] Refill.  Patient has upcoming appt in one month with Dr. Ancil Boozer.

## 2020-10-21 DIAGNOSIS — H401111 Primary open-angle glaucoma, right eye, mild stage: Secondary | ICD-10-CM | POA: Diagnosis not present

## 2020-10-23 ENCOUNTER — Other Ambulatory Visit: Payer: Self-pay | Admitting: Family Medicine

## 2020-10-23 DIAGNOSIS — E785 Hyperlipidemia, unspecified: Secondary | ICD-10-CM

## 2020-10-23 NOTE — Telephone Encounter (Signed)
Requested Prescriptions  Pending Prescriptions Disp Refills  . omega-3 acid ethyl esters (LOVAZA) 1 g capsule [Pharmacy Med Name: Omega-3-acid Ethyl Esters 1 GM Oral Capsule] 360 capsule 0    Sig: Take 2 capsules by mouth twice daily     Endocrinology:  Nutritional Agents Passed - 10/23/2020  4:20 PM      Passed - Valid encounter within last 12 months    Recent Outpatient Visits          9 months ago PVD (peripheral vascular disease) with claudication [facility])   [facility] Sizer, MD   1 year ago PVD (peripheral vascular disease) with claudication [facility])   [facility] Sizer, MD   1 year ago Right wrist pain   [facility] Steele Sizer, MD   1 year ago Essential hypertension   [facility] Sizer, MD   2 years ago Essential hypertension   [facility] Sizer, MD      Future Appointments            In 3 weeks Steele Sizer, MD [facility], [place]   In 2 months  [place]

## 2020-11-08 NOTE — Progress Notes (Signed)
Name: Frank Hamilton   MRN: [id number]    DOB: 01/07/1941   Date:11/15/2020       Progress Note  Subjective  Chief Complaint  Follow up  HPI  HTN: taking medication and denies side effects, no chest pain or palpitation He is  on ARB, HCTZ and Norvasc . He needs refills of his medication. History of albuminuria and seen by nephrologist in the past.   Skin lesions: on chest wall, red , bumpy and does not seem to be moving around, discussed referral to dermatologist.   Dyslipidemia: Atorvastatinand lovazasince triglycerideswasover 500, also taking aspirin daily . He denies side effects of medication, he will have labs done today. He is not fasting, but only had coffee, and apple and bran   GERD: he states he is avoiding eating late at night and symptoms are better controlled, he has not taken medication in a while.   OA: mostly on hands taking otc glucosamine, not on nsaid's except at most once a week takes Naproxen when very active.He saw Ortho in 2020 and was given reassurance. Unchanged  Low back pain: he noticed a dull pain that was shooting between both side of lower back months ago. He states more localized on right outer hip and lower back now, radiates down right lateral leg and causes pain when walking but also bothering him at night now. No bowel or bladder incontinence, difficulty describing leg pain   PVD with claudication: mildly abnormal ABI he had PT ,  he can walk down his driveway - 2/3 mile without having to stop, he states lately seems to be walking less due to back pain  History of left kidney cyst : that went down in size from 2014 to 2015 and per studies stable, no hematuria or flank pain. Unchanged  Patient Active Problem List   Diagnosis Date Noted  . Positive for macroalbuminuria 08/06/2018  . PVD (peripheral vascular disease) with claudication (HCC) 07/02/2018  . Senile purpura (HCC) 07/02/2018  . Claudication (HCC) 04/23/2018  . Vitamin D deficiency  05/02/2016  . Osteoarthritis of both hands 05/01/2016  . Hypertension, benign 08/03/2015  . Hyperlipidemia 08/03/2015  . Kidney cysts 12/15/2013    Past Surgical History:  Procedure Laterality Date  . bowel obstruction  2011  . HERNIA REPAIR  2009 and 2010  . TONSILLECTOMY     age 79 or 58    Family History  Problem Relation Age of Onset  . Heart disease Mother   . Stroke Father   . Heart disease Sister   . Diabetes Sister   . Diabetes Maternal Grandfather     Social History   Tobacco Use  . Smoking status: Former Smoker    Packs/day: 1.00    Years: 20.00    Pack years: 20.00    Types: Cigarettes    Quit date: 06/08/1974    Years since quitting: 46.4  . Smokeless tobacco: Never Used  Substance Use Topics  . Alcohol use: Yes    Alcohol/week: 1.0 standard drink    Types: 1 Cans of beer per week     Current Outpatient Medications:  .  amLODipine (NORVASC) 10 MG tablet, Take 1 tablet by mouth once daily, Disp: 90 tablet, Rfl: 0 .  aspirin 81 MG chewable tablet, Chew 81 mg by mouth., Disp: , Rfl:  .  atorvastatin (LIPITOR) 40 MG tablet, Take 1 tablet by mouth once daily, Disp: 90 tablet, Rfl: 0 .  Cholecalciferol (VITAMIN D) 2000 units CAPS, Take  1 capsule (2,000 Units total) by mouth daily., Disp: 30 capsule, Rfl:  .  glucosamine-chondroitin 500-400 MG tablet, Take 1 tablet by mouth in the morning and at bedtime. , Disp: , Rfl:  .  hydrochlorothiazide (HYDRODIURIL) 12.5 MG tablet, Take 1 tablet by mouth once daily, Disp: 90 tablet, Rfl: 0 .  losartan (COZAAR) 50 MG tablet, Take 1 tablet (50 mg total) by mouth daily., Disp: 90 tablet, Rfl: 1 .  omega-3 acid ethyl esters (LOVAZA) 1 g capsule, Take 2 capsules by mouth twice daily, Disp: 360 capsule, Rfl: 0  Allergies  Allergen Reactions  . Sulfa Antibiotics Itching and Rash    I personally reviewed active problem list, medication list, allergies, family history, social history, health maintenance with the  patient/caregiver today.   ROS  Constitutional: Negative for fever or weight change.  Respiratory: Negative for cough and shortness of breath.   Cardiovascular: Negative for chest pain or palpitations.  Gastrointestinal: Negative for abdominal pain, no bowel changes.  Musculoskeletal: Negative for gait problem or joint swelling.  Skin: Negative for rash.  Neurological: Negative for dizziness or headache.  No other specific complaints in a complete review of systems (except as listed in HPI above).  Objective  Vitals:   11/15/20 1150  BP: 138/62  Pulse: 64  Resp: 18  Temp: 98.1 F (36.7 C)  TempSrc: Oral  SpO2: 93%  Weight: 206 lb 6.4 oz (93.6 kg)  Height: 5\' 11"  (1.803 m)    Body mass index is 28.79 kg/m.  Physical Exam  Constitutional: Patient appears well-developed and well-nourished.No distress.  HEENT: head atraumatic, normocephalic, pupils equal and reactive to light,neck supple Cardiovascular: Normal rate, regular rhythm and normal heart sounds.  No murmur heard. No BLE edema. Pulmonary/Chest: Effort normal and breath sounds normal. No respiratory distress. Abdominal: Soft.  There is no tenderness. Muscular Skeletal: no pain during palpation of lumbar spine, pain on right lateral hip normal rom of both hips, deformty on his hands Skin: erythematous rash on anterior chest and neck Psychiatric: Patient has a normal mood and affect. behavior is normal. Judgment and thought content normal.  PHQ2/9: Depression screen [facility] 2/9 11/15/2020 01/09/2020 01/02/2020 06/30/2019 05/02/2019  Decreased Interest 0 0 0 0 0  Down, Depressed, Hopeless 0 0 0 0 0  PHQ - 2 Score 0 0 0 0 0  Altered sleeping - - 0 0 0  Tired, decreased energy - - 0 0 0  Change in appetite - - 0 0 0  Feeling bad or failure about yourself  - - 0 0 0  Trouble concentrating - - 0 0 0  Moving slowly or fidgety/restless - - 0 0 0  Suicidal thoughts - - 0 0 0  PHQ-9 Score - - 0 0 0  Difficult doing work/chores  - - Not difficult at all - -    phq 9 is negative   Fall Risk: Fall Risk  11/15/2020 01/09/2020 01/02/2020 06/30/2019 05/02/2019  Falls in the past year? 1 0 0 0 0  Number falls in past yr: 0 0 0 0 0  Injury with Fall? 0 0 0 0 0  Risk for fall due to : History of fall(s) No Fall Risks - - -  Follow up - Falls prevention discussed - - -      Functional Status Survey: Is the patient deaf or have difficulty hearing?: No Does the patient have difficulty seeing, even when wearing glasses/contacts?: No Does the patient have difficulty concentrating, remembering, or making  decisions?: No Does the patient have difficulty walking or climbing stairs?: Yes Does the patient have difficulty dressing or bathing?: No Does the patient have difficulty doing errands alone such as visiting a doctor's office or shopping?: Yes    Assessment & Plan  1. Dyslipidemia  - Lipid panel - atorvastatin (LIPITOR) 40 MG tablet; Take 1 tablet (40 mg total) by mouth daily.  Dispense: 90 tablet; Refill: 1 - omega-3 acid ethyl esters (LOVAZA) 1 g capsule; Take 2 capsules (2 g total) by mouth 2 (two) times daily.  Dispense: 360 capsule; Refill: 1  2. PVD (peripheral vascular disease) with claudication ([place])   3. Primary osteoarthritis involving multiple joints   4. Essential hypertension  - COMPLETE METABOLIC PANEL WITH GFR - CBC with Differential/Platelet - amLODipine (NORVASC) 10 MG tablet; Take 1 tablet (10 mg total) by mouth daily.  Dispense: 90 tablet; Refill: 1 - hydrochlorothiazide (HYDRODIURIL) 12.5 MG tablet; Take 1 tablet (12.5 mg total) by mouth daily.  Dispense: 90 tablet; Refill: 1  5. Vitamin D deficiency   6. Senile purpura (HCC)  stable  7. Albuminuria  - Microalbumin / creatinine urine ratio  8. Primary osteoarthritis of both hands   9. Skin lesion  - Ambulatory referral to Dermatology  10. Need for hepatitis C screening test   - Hepatitis C antibody  11. Needs flu  shot  - Flu vaccine HIGH DOSE PF (Fluzone High dose)  12. Low back pain with radiation  - meloxicam (MOBIC) 15 MG tablet; Take 1 tablet (15 mg total) by mouth daily.  Dispense: 30 tablet; Refill: 0 - gabapentin (NEURONTIN) 100 MG capsule; Take 1 capsule (100 mg total) by mouth 3 (three) times daily. Start at night and if tolerated increase to three times daily, we can increase dose as tolerated  Dispense: 90 capsule; Refill: 0 - DG Lumbar Spine Complete; Future

## 2020-11-15 ENCOUNTER — Ambulatory Visit: Payer: Medicare Other | Admitting: Family Medicine

## 2020-11-15 ENCOUNTER — Encounter: Payer: Self-pay | Admitting: Family Medicine

## 2020-11-15 ENCOUNTER — Other Ambulatory Visit: Payer: Self-pay

## 2020-11-15 ENCOUNTER — Ambulatory Visit
Admission: RE | Admit: 2020-11-15 | Discharge: 2020-11-15 | Disposition: A | Discharge status: Home / Self Care | Payer: Medicare Other | Attending: Family Medicine | Admitting: Family Medicine

## 2020-11-15 ENCOUNTER — Ambulatory Visit
Admission: RE | Admit: 2020-11-15 | Discharge: 2020-11-15 | Disposition: A | Discharge status: Home / Self Care | Payer: Medicare Other | Source: Ambulatory Visit | Attending: Family Medicine | Admitting: Family Medicine

## 2020-11-15 VITALS — BP 138/62 | HR 64 | Temp 98.1°F | Resp 18 | Ht 71.0 in | Wt 206.4 lb

## 2020-11-15 DIAGNOSIS — M19041 Primary osteoarthritis, right hand: Secondary | ICD-10-CM

## 2020-11-15 DIAGNOSIS — I10 Essential (primary) hypertension: Secondary | ICD-10-CM | POA: Diagnosis not present

## 2020-11-15 DIAGNOSIS — I739 Peripheral vascular disease, unspecified: Secondary | ICD-10-CM

## 2020-11-15 DIAGNOSIS — E559 Vitamin D deficiency, unspecified: Secondary | ICD-10-CM

## 2020-11-15 DIAGNOSIS — E785 Hyperlipidemia, unspecified: Secondary | ICD-10-CM

## 2020-11-15 DIAGNOSIS — Z1159 Encounter for screening for other viral diseases: Secondary | ICD-10-CM

## 2020-11-15 DIAGNOSIS — M545 Low back pain, unspecified: Secondary | ICD-10-CM | POA: Diagnosis not present

## 2020-11-15 DIAGNOSIS — Z23 Encounter for immunization: Secondary | ICD-10-CM

## 2020-11-15 DIAGNOSIS — M19042 Primary osteoarthritis, left hand: Secondary | ICD-10-CM

## 2020-11-15 DIAGNOSIS — R809 Proteinuria, unspecified: Secondary | ICD-10-CM

## 2020-11-15 DIAGNOSIS — M8949 Other hypertrophic osteoarthropathy, multiple sites: Secondary | ICD-10-CM

## 2020-11-15 DIAGNOSIS — M159 Polyosteoarthritis, unspecified: Secondary | ICD-10-CM

## 2020-11-15 DIAGNOSIS — L989 Disorder of the skin and subcutaneous tissue, unspecified: Secondary | ICD-10-CM

## 2020-11-15 DIAGNOSIS — D692 Other nonthrombocytopenic purpura: Secondary | ICD-10-CM

## 2020-11-15 MED ORDER — HYDROCHLOROTHIAZIDE 12.5 MG PO TABS: 12.5000 mg | ORAL_TABLET | Freq: Every day | ORAL | 1 refills | Status: DC

## 2020-11-15 MED ORDER — ATORVASTATIN CALCIUM 40 MG PO TABS: 40.0000 mg | ORAL_TABLET | Freq: Every day | ORAL | 1 refills | Status: DC

## 2020-11-15 MED ORDER — MELOXICAM 15 MG PO TABS: 15.0000 mg | ORAL_TABLET | Freq: Every day | ORAL | 0 refills | Status: DC

## 2020-11-15 MED ORDER — LOSARTAN POTASSIUM 50 MG PO TABS: 50.0000 mg | ORAL_TABLET | Freq: Every day | ORAL | 1 refills | Status: DC

## 2020-11-15 MED ORDER — AMLODIPINE BESYLATE 10 MG PO TABS: 10.0000 mg | ORAL_TABLET | Freq: Every day | ORAL | 1 refills | Status: DC

## 2020-11-15 MED ORDER — OMEGA-3-ACID ETHYL ESTERS 1 G PO CAPS
2.0000 | ORAL_CAPSULE | Freq: Two times a day (BID) | ORAL | 1 refills | Status: DC
Start: 2020-11-15 — End: 2021-03-09

## 2020-11-15 MED ORDER — GABAPENTIN 100 MG PO CAPS
100.0000 mg | ORAL_CAPSULE | Freq: Three times a day (TID) | ORAL | 0 refills | Status: DC
Start: 2020-11-15 — End: 2021-01-11

## 2020-11-15 NOTE — Patient Instructions (Signed)
Gabapentin 100 mg start with one at night and increase by one capsule every two days to a max of three capsules three times daily

## 2020-11-16 LAB — CBC WITH DIFFERENTIAL/PLATELET
Absolute Monocytes: 731 cells/uL (ref 200–950)
Basophils Absolute: 28 cells/uL (ref 0–200)
Basophils Relative: 0.4 %
Eosinophils Absolute: 312 cells/uL (ref 15–500)
Eosinophils Relative: 4.4 %
HCT: 44.7 % (ref 38.5–50.0)
Hemoglobin: 15.1 g/dL (ref 13.2–17.1)
Lymphs Abs: 2357 cells/uL (ref 850–3900)
MCH: 30 pg (ref 27.0–33.0)
MCHC: 33.8 g/dL (ref 32.0–36.0)
MCV: 88.9 fL (ref 80.0–100.0)
MPV: 9.7 fL (ref 7.5–12.5)
Monocytes Relative: 10.3 %
Neutro Abs: 3671 cells/uL (ref 1500–7800)
Neutrophils Relative %: 51.7 %
Platelets: 194 10*3/uL (ref 140–400)
RBC: 5.03 10*6/uL (ref 4.20–5.80)
RDW: 13.8 % (ref 11.0–15.0)
Total Lymphocyte: 33.2 %
WBC: 7.1 10*3/uL (ref 3.8–10.8)

## 2020-11-16 LAB — COMPLETE METABOLIC PANEL WITH GFR
AG Ratio: 2 (calc) (ref 1.0–2.5)
ALT: 31 U/L (ref 9–46)
AST: 21 U/L (ref 10–35)
Albumin: 4.7 g/dL (ref 3.6–5.1)
Alkaline phosphatase (APISO): 49 U/L (ref 35–144)
BUN/Creatinine Ratio: 26 (calc) — ABNORMAL HIGH (ref 6–22)
BUN: 18 mg/dL (ref 7–25)
CO2: 29 mmol/L (ref 20–32)
Calcium: 10 mg/dL (ref 8.6–10.3)
Chloride: 102 mmol/L (ref 98–110)
Creat: 0.69 mg/dL — ABNORMAL LOW (ref 0.70–1.18)
GFR, Est African American: 106 mL/min/{1.73_m2} (ref >=60)
GFR, Est Non African American: 92 mL/min/{1.73_m2} (ref >=60)
Globulin: 2.4 g/dL (calc) (ref 1.9–3.7)
Glucose, Bld: 69 mg/dL (ref 65–99)
Potassium: 4.4 mmol/L (ref 3.5–5.3)
Sodium: 139 mmol/L (ref 135–146)
Total Bilirubin: 0.6 mg/dL (ref 0.2–1.2)
Total Protein: 7.1 g/dL (ref 6.1–8.1)

## 2020-11-16 LAB — LIPID PANEL
Cholesterol: 120 mg/dL (ref <200)
HDL: 28 mg/dL — ABNORMAL LOW (ref >=40)
LDL Cholesterol (Calc): 63 mg/dL (calc)
Non-HDL Cholesterol (Calc): 92 mg/dL (calc) (ref <130)
Total CHOL/HDL Ratio: 4.3 (calc) (ref <5.0)
Triglycerides: 248 mg/dL — ABNORMAL HIGH (ref <150)

## 2020-11-16 LAB — MICROALBUMIN / CREATININE URINE RATIO
Creatinine, Urine: 82 mg/dL (ref 20–320)
Microalb Creat Ratio: 502 mcg/mg creat — ABNORMAL HIGH (ref <30)
Microalb, Ur: 41.2 mg/dL

## 2020-11-16 LAB — HEPATITIS C ANTIBODY
Hepatitis C Ab: NONREACTIVE
SIGNAL TO CUT-OFF: 0.42 (ref <1.00)

## 2020-11-17 ENCOUNTER — Other Ambulatory Visit: Payer: Self-pay

## 2020-11-17 ENCOUNTER — Other Ambulatory Visit: Payer: Self-pay | Admitting: Family Medicine

## 2020-11-17 DIAGNOSIS — R809 Proteinuria, unspecified: Secondary | ICD-10-CM

## 2020-11-17 NOTE — Progress Notes (Unsigned)
Per Dr.Sowles

## 2020-11-22 ENCOUNTER — Telehealth: Payer: Self-pay

## 2020-11-22 NOTE — Telephone Encounter (Signed)
Copied from [place] [id number]. Topic: General - Inquiry >> Nov 22, 2020 11:51 AM Gillis Ends D wrote: Reason for CRM: Patient had trouble with one of his medications (Gabapentin) so he stopped taking it and he wasn't to let Dr. Ancil Boozer know. If you have any questions he can be reached at [phone]. Please advise

## 2020-12-01 ENCOUNTER — Other Ambulatory Visit: Payer: Self-pay | Admitting: Family Medicine

## 2020-12-01 DIAGNOSIS — M5136 Other intervertebral disc degeneration, lumbar region: Secondary | ICD-10-CM

## 2020-12-01 DIAGNOSIS — M545 Low back pain, unspecified: Secondary | ICD-10-CM

## 2020-12-04 DIAGNOSIS — K047 Periapical abscess without sinus: Secondary | ICD-10-CM | POA: Diagnosis not present

## 2020-12-06 ENCOUNTER — Telehealth: Payer: Self-pay

## 2020-12-06 NOTE — Telephone Encounter (Signed)
Patient has been informed that he has been scheduled to have his MRI on 12/09/20 at [facility]. He was instructed to arrive at least 15-30 mins early at the [place] entrance to be screened and registered.   Patient expressed verbal understanding.

## 2020-12-09 ENCOUNTER — Other Ambulatory Visit: Payer: Self-pay

## 2020-12-09 ENCOUNTER — Ambulatory Visit
Admission: RE | Admit: 2020-12-09 | Discharge: 2020-12-09 | Disposition: A | Discharge status: Home / Self Care | Payer: Medicare Other | Source: Ambulatory Visit | Attending: Family Medicine | Admitting: Family Medicine

## 2020-12-09 DIAGNOSIS — M545 Low back pain, unspecified: Secondary | ICD-10-CM | POA: Insufficient documentation

## 2020-12-09 DIAGNOSIS — M5136 Other intervertebral disc degeneration, lumbar region: Secondary | ICD-10-CM | POA: Diagnosis not present

## 2020-12-14 DIAGNOSIS — R809 Proteinuria, unspecified: Secondary | ICD-10-CM | POA: Diagnosis not present

## 2020-12-14 DIAGNOSIS — I10 Essential (primary) hypertension: Secondary | ICD-10-CM | POA: Diagnosis not present

## 2020-12-20 ENCOUNTER — Other Ambulatory Visit: Payer: Self-pay | Admitting: Family Medicine

## 2020-12-20 DIAGNOSIS — M545 Low back pain, unspecified: Secondary | ICD-10-CM

## 2020-12-20 MED ORDER — MELOXICAM 15 MG PO TABS: 15.0000 mg | ORAL_TABLET | Freq: Every day | ORAL | 0 refills | Status: DC

## 2020-12-20 NOTE — Telephone Encounter (Signed)
Medication Refill - Medication: meloxicam (MOBIC) 15 MG tablet  Has the patient contacted their pharmacy? Yes.  advised to reach out to PCP.  Preferred Pharmacy (with phone number or street name):   [facility] [address], [place]  [place] [address]  Phone: [phone] Fax: [phone]    Agent: Please be advised that RX refills may take up to 3 business days. We ask that you follow-up with your pharmacy.

## 2021-01-11 ENCOUNTER — Other Ambulatory Visit: Payer: Self-pay

## 2021-01-11 ENCOUNTER — Ambulatory Visit (INDEPENDENT_AMBULATORY_CARE_PROVIDER_SITE_OTHER): Payer: Medicare Other

## 2021-01-11 VITALS — BP 150/68 | HR 68 | Temp 98.4°F | Resp 16 | Ht 71.0 in | Wt 212.6 lb

## 2021-01-11 DIAGNOSIS — Z0000 Encounter for general adult medical examination without abnormal findings: Secondary | ICD-10-CM | POA: Diagnosis not present

## 2021-01-11 NOTE — Progress Notes (Signed)
Subjective:   Frank Hamilton is a 80 y.o. male who presents for Medicare Annual/Subsequent preventive examination.  Review of Systems     Cardiac Risk Factors include: advanced age (>55men, >41 women);hypertension;male gender;dyslipidemia;sedentary lifestyle     Objective:    Today's Vitals   01/11/21 1143 01/11/21 1145  BP: (!) 150/68   Pulse: 68   Resp: 16   Temp: 98.4 F (36.9 C)   TempSrc: Oral   SpO2: 96%   Weight: 212 lb 9.6 oz (96.4 kg)   Height: 5\' 11"  (1.803 m)   PainSc:  1    Body mass index is 29.65 kg/m.  Advanced Directives 01/11/2021 01/09/2020 01/02/2019 04/17/2018 06/27/2017 03/19/2017 02/14/2017  Does Patient Have a Medical Advance Directive? No No No No No No No  Would patient like information on creating a medical advance directive? No - Patient declined Yes (MAU/Ambulatory/Procedural Areas - Information given) No - Patient declined No - Patient declined - - -    Current Medications (verified) Outpatient Encounter Medications as of 01/11/2021  Medication Sig  . amLODipine (NORVASC) 10 MG tablet Take 1 tablet (10 mg total) by mouth daily.  Marland Kitchen aspirin 81 MG chewable tablet Chew 81 mg by mouth.  Marland Kitchen atorvastatin (LIPITOR) 40 MG tablet Take 1 tablet (40 mg total) by mouth daily.  . Cholecalciferol (VITAMIN D) 2000 units CAPS Take 1 capsule (2,000 Units total) by mouth daily.  Marland Kitchen glucosamine-chondroitin 500-400 MG tablet Take 1 tablet by mouth in the morning and at bedtime.   . hydrochlorothiazide (HYDRODIURIL) 12.5 MG tablet Take 1 tablet (12.5 mg total) by mouth daily.  Marland Kitchen losartan (COZAAR) 50 MG tablet Take 1 tablet (50 mg total) by mouth daily.  . meloxicam (MOBIC) 15 MG tablet Take 1 tablet (15 mg total) by mouth daily.  Marland Kitchen omega-3 acid ethyl esters (LOVAZA) 1 g capsule Take 2 capsules (2 g total) by mouth 2 (two) times daily.  . [DISCONTINUED] gabapentin (NEURONTIN) 100 MG capsule Take 1 capsule (100 mg total) by mouth 3 (three) times daily. Start at night and if  tolerated increase to three times daily, we can increase dose as tolerated   No facility-administered encounter medications on file as of 01/11/2021.    Allergies (verified) Gabapentin and Sulfa antibiotics   History: Past Medical History:  Diagnosis Date  . Hyperlipidemia   . Hypertension   . Kidney cysts   . Osteoarthritis   . PVD (peripheral vascular disease) ([place])   . Vitamin D deficiency    Past Surgical History:  Procedure Laterality Date  . bowel obstruction  2011  . CATARACT EXTRACTION, BILATERAL  2017  . HERNIA REPAIR  2009 and 2010  . TONSILLECTOMY     age 42 or 73   Family History  Problem Relation Age of Onset  . Heart disease Mother   . Stroke Father   . Heart disease Sister   . Diabetes Sister   . Diabetes Maternal Grandfather    Social History   Socioeconomic History  . Marital status: Widowed    Spouse name: Not on file  . Number of children: 3  . Years of education: Not on file  . Highest education level: High school graduate  Occupational History  . Occupation: retired  Tobacco Use  . Smoking status: Former Smoker    Packs/day: 1.00    Years: 20.00    Pack years: 20.00    Types: Cigarettes    Quit date: 06/08/1974    Years since  quitting: 46.6  . Smokeless tobacco: Never Used  Vaping Use  . Vaping Use: Never used  Substance and Sexual Activity  . Alcohol use: Yes    Alcohol/week: 1.0 standard drink    Types: 1 Cans of beer per week  . Drug use: No  . Sexual activity: Not Currently  Other Topics Concern  . Not on file  Social History Narrative   He was married for 43 years , wife died in 2016-02-06. Lives with his dog   Social Determinants of Health   Financial Resource Strain: Low Risk   . Difficulty of Paying Living Expenses: Not hard at all  Food Insecurity: No Food Insecurity  . Worried About Charity fundraiser in the Last Year: Never true  . Ran Out of Food in the Last Year: Never true  Transportation Needs: No Transportation Needs   . Lack of Transportation (Medical): No  . Lack of Transportation (Non-Medical): No  Physical Activity: Inactive  . Days of Exercise per Week: 0 days  . Minutes of Exercise per Session: 0 min  Stress: No Stress Concern Present  . Feeling of Stress : Only a little  Social Connections: Socially Isolated  . Frequency of Communication with Friends and Family: More than three times a week  . Frequency of Social Gatherings with Friends and Family: Twice a week  . Attends Religious Services: Never  . Active Member of Clubs or Organizations: No  . Attends Archivist Meetings: Never  . Marital Status: Widowed    Tobacco Counseling Counseling given: Not Answered   Clinical Intake:  Pre-visit preparation completed: Yes  Pain : 0-10 Pain Score: 1  Pain Type: Chronic pain Pain Location: Hip Pain Orientation: Right Pain Descriptors / Indicators: Aching,Dull,Sore Pain Onset: More than a month ago Pain Frequency: Intermittent     BMI - recorded: 29.65 Nutritional Status: BMI 25 -29 Overweight Nutritional Risks: None Diabetes: No  How often do you need to have someone help you when you read instructions, pamphlets, or other written materials from your doctor or pharmacy?: 1 - Never    Interpreter Needed?: No  Information entered by :: Clemetine Marker LPN   Activities of Daily Living In your present state of health, do you have any difficulty performing the following activities: 01/11/2021 11/15/2020  Hearing? Y N  Comment needs hearing aids -  Vision? N N  Difficulty concentrating or making decisions? N N  Walking or climbing stairs? Y Y  Dressing or bathing? N N  Doing errands, shopping? Tempie Donning  Preparing Food and eating ? N -  Using the Toilet? N -  In the past six months, have you accidently leaked urine? Y -  Do you have problems with loss of bowel control? N -  Managing your Medications? N -  Managing your Finances? N -  Housekeeping or managing your Housekeeping?  N -  Some recent data might be hidden    Patient Care Team: Steele Sizer, MD as PCP - General (Family Medicine) Lavonia Dana, MD as Consulting Physician (Nephrology)  Indicate any recent Medical Services you may have received from other than [facility] providers in the past year (date may be approximate).     Assessment:   This is a routine wellness examination for Frank Hamilton.  Hearing/Vision screen  Hearing Screening   125Hz  250Hz  500Hz  1000Hz  2000Hz  3000Hz  4000Hz  6000Hz  8000Hz   Right ear:           Left ear:  Comments: Pt c/o poor hearing; had hearing evaluation with [facility] in [place] in Feb 22 needs hearing aids and plans to go to [facility] for them due to cost   Vision Screening Comments: Annual vision screenings at [facility] in [place] issues and exercise activities discussed: Current Exercise Habits: The patient does not participate in regular exercise at present, Exercise limited by: orthopedic condition(s)  Goals    . Patient Stated     Patient states he would like to remain healthy and continue to improve gait, balance and strength in legs.      Depression Screen PHQ 2/9 Scores 01/11/2021 11/15/2020 01/09/2020 01/02/2020 06/30/2019 05/02/2019 02/18/2019  PHQ - 2 Score 1 0 0 0 0 0 0  PHQ- 9 Score 1 - - 0 0 0 0    Fall Risk Fall Risk  01/11/2021 11/15/2020 01/09/2020 01/02/2020 06/30/2019  Falls in the past year? 1 1 0 0 0  Number falls in past yr: 0 0 0 0 0  Injury with Fall? 0 0 0 0 0  Risk for fall due to : History of fall(s) History of fall(s) No Fall Risks - -  Follow up Falls prevention discussed - Falls prevention discussed - -    FALL RISK PREVENTION PERTAINING TO THE HOME:  Any stairs in or around the home? Yes  If so, are there any without handrails? No  Home free of loose throw rugs in walkways, pet beds, electrical cords, etc? Yes  Adequate lighting in your home to reduce risk of falls? Yes   ASSISTIVE DEVICES UTILIZED TO PREVENT FALLS:  Life  alert? No  Use of a cane, walker or w/c? No  Grab bars in the bathroom? No  Shower chair or bench in shower? Yes  Elevated toilet seat or a handicapped toilet? Yes   TIMED UP AND GO:  Was the test performed? Yes .  Length of time to ambulate 10 feet: 7 sec.   Gait slow and steady without use of assistive device  Cognitive Function:     6CIT Screen 01/09/2020 01/02/2019  What Year? 0 points 0 points  What month? 0 points 0 points  What time? 0 points 0 points  Count back from 20 0 points 0 points  Months in reverse 0 points 0 points  Repeat phrase 0 points 0 points  Total Score 0 0    Immunizations Immunization History  Administered Date(s) Administered  . Fluad Quad(high Dose 65+) 09/08/2019  . Influenza, High Dose Seasonal PF 11/15/2016, 09/27/2017, 10/04/2018, 11/15/2020  . Influenza,inj,Quad PF,6+ Mos 08/03/2015  . Influenza-Unspecified 09/13/2013  . PFIZER(Purple Top)SARS-COV-2 Vaccination 12/18/2019, 01/08/2020  . Pneumococcal Conjugate-13 04/01/2018  . Tdap 08/15/2011    TDAP status: Up to date  Flu Vaccine status: Up to date  Pneumococcal vaccine status: Up to date  Covid-19 vaccine status: Completed vaccines  Qualifies for Shingles Vaccine? Yes   Zostavax completed Yes   Shingrix Completed?: No.    Education has been provided regarding the importance of this vaccine. Patient has been advised to call insurance company to determine out of pocket expense if they have not yet received this vaccine. Advised may also receive vaccine at local pharmacy or Health Dept. Verbalized acceptance and understanding.  Screening Tests Health Maintenance  Topic Date Due  . COVID-19 Vaccine (3 - Booster for Pfizer series) 07/07/2020  . TETANUS/TDAP  08/14/2021  . INFLUENZA VACCINE  Completed  . Hepatitis C Screening  Completed  . PNA vac Low Risk Adult  Completed  . HPV VACCINES  Aged Out    Health Maintenance  Health Maintenance Due  Topic Date Due  . COVID-19  Vaccine (3 - Booster for Pfizer series) 07/07/2020    Colorectal cancer screening: No longer required.   Lung Cancer Screening: (Low Dose CT Chest recommended if Age 11-80 years, 30 pack-year currently smoking OR have quit w/in 15years.) does not qualify.   Additional Screening:  Hepatitis C Screening: does qualify; Completed 11/15/20  Vision Screening: Recommended annual ophthalmology exams for early detection of glaucoma and other disorders of the eye. Is the patient up to date with their annual eye exam?  Yes  Who is the provider or what is the name of the office in which the patient attends annual eye exams? [facility]  Dental Screening: Recommended annual dental exams for proper oral hygiene  Community Resource Referral / Chronic Care Management: CRR required this visit?  No   CCM required this visit?  No      Plan:     I have personally reviewed and noted the following in the patient's chart:   . Medical and social history . Use of alcohol, tobacco or illicit drugs  . Current medications and supplements . Functional ability and status . Nutritional status . Physical activity . Advanced directives . List of other physicians . Hospitalizations, surgeries, and ER visits in previous 12 months . Vitals . Screenings to include cognitive, depression, and falls . Referrals and appointments  In addition, I have reviewed and discussed with patient certain preventive protocols, quality metrics, and best practice recommendations. A written personalized care plan for preventive services as well as general preventive health recommendations were provided to patient.     Clemetine Marker, LPN   06/14/8002   Nurse Notes: none

## 2021-01-11 NOTE — Patient Instructions (Signed)
Frank Hamilton , Thank you for taking time to come for your Medicare Wellness Visit. I appreciate your ongoing commitment to your health goals. Please review the following plan we discussed and let me know if I can assist you in the future.   Screening recommendations/referrals: Colonoscopy: no longer required Recommended yearly ophthalmology/optometry visit for glaucoma screening and checkup Recommended yearly dental visit for hygiene and checkup  Vaccinations: Influenza vaccine: done 11/15/20 Pneumococcal vaccine: done 04/01/18 Tdap vaccine: done 08/15/11 Shingles vaccine: Shingrix discussed. Please contact your pharmacy for coverage information.  Covid-19:  Done 12/18/19 & 01/08/20; we will contact [facility] for your booster vaccine information  Advanced directives: Advance directive discussed with you today. Even though you declined this today please call our office should you change your mind and we can give you the proper paperwork for you to fill out.  Conditions/risks identified: Recommend drinking 6-8 glasses of water per day and increasing physical activity   Next appointment: Follow up in one year for your annual wellness visit.   Preventive Care 57 Years and Older, Male Preventive care refers to lifestyle choices and visits with your health care provider that can promote health and wellness. What does preventive care include?  A yearly physical exam. This is also called an annual well check.  Dental exams once or twice a year.  Routine eye exams. Ask your health care provider how often you should have your eyes checked.  Personal lifestyle choices, including:  Daily care of your teeth and gums.  Regular physical activity.  Eating a healthy diet.  Avoiding tobacco and drug use.  Limiting alcohol use.  Practicing safe sex.  Taking low doses of aspirin every day.  Taking vitamin and mineral supplements as recommended by your health care provider. What happens during an  annual well check? The services and screenings done by your health care provider during your annual well check will depend on your age, overall health, lifestyle risk factors, and family history of disease. Counseling  Your health care provider may ask you questions about your:  Alcohol use.  Tobacco use.  Drug use.  Emotional well-being.  Home and relationship well-being.  Sexual activity.  Eating habits.  History of falls.  Memory and ability to understand (cognition).  Work and work Statistician. Screening  You may have the following tests or measurements:  Height, weight, and BMI.  Blood pressure.  Lipid and cholesterol levels. These may be checked every 5 years, or more frequently if you are over 57 years old.  Skin check.  Lung cancer screening. You may have this screening every year starting at age 44 if you have a 30-pack-year history of smoking and currently smoke or have quit within the past 15 years.  Fecal occult blood test (FOBT) of the stool. You may have this test every year starting at age 77.  Flexible sigmoidoscopy or colonoscopy. You may have a sigmoidoscopy every 5 years or a colonoscopy every 10 years starting at age 45.  Prostate cancer screening. Recommendations will vary depending on your family history and other risks.  Hepatitis C blood test.  Hepatitis B blood test.  Sexually transmitted disease (STD) testing.  Diabetes screening. This is done by checking your blood sugar (glucose) after you have not eaten for a while (fasting). You may have this done every 1-3 years.  Abdominal aortic aneurysm (AAA) screening. You may need this if you are a current or former smoker.  Osteoporosis. You may be screened starting at age 11  if you are at high risk. Talk with your health care provider about your test results, treatment options, and if necessary, the need for more tests. Vaccines  Your health care provider may recommend certain vaccines,  such as:  Influenza vaccine. This is recommended every year.  Tetanus, diphtheria, and acellular pertussis (Tdap, Td) vaccine. You may need a Td booster every 10 years.  Zoster vaccine. You may need this after age 8.  Pneumococcal 13-valent conjugate (PCV13) vaccine. One dose is recommended after age 72.  Pneumococcal polysaccharide (PPSV23) vaccine. One dose is recommended after age 73. Talk to your health care provider about which screenings and vaccines you need and how often you need them. This information is not intended to replace advice given to you by your health care provider. Make sure you discuss any questions you have with your health care provider. Document Released: 11/26/2015 Document Revised: 07/19/2016 Document Reviewed: 08/31/2015 Elsevier Interactive Patient Education  2017 [place] Prevention in the Home Falls can cause injuries. They can happen to people of all ages. There are many things you can do to make your home safe and to help prevent falls. What can I do on the outside of my home?  Regularly fix the edges of walkways and driveways and fix any cracks.  Remove anything that might make you trip as you walk through a door, such as a raised step or threshold.  Trim any bushes or trees on the path to your home.  Use bright outdoor lighting.  Clear any walking paths of anything that might make someone trip, such as rocks or tools.  Regularly check to see if handrails are loose or broken. Make sure that both sides of any steps have handrails.  Any raised decks and porches should have guardrails on the edges.  Have any leaves, snow, or ice cleared regularly.  Use sand or salt on walking paths during winter.  Clean up any spills in your garage right away. This includes oil or grease spills. What can I do in the bathroom?  Use night lights.  Install grab bars by the toilet and in the tub and shower. Do not use towel bars as grab bars.  Use  non-skid mats or decals in the tub or shower.  If you need to sit down in the shower, use a plastic, non-slip stool.  Keep the floor dry. Clean up any water that spills on the floor as soon as it happens.  Remove soap buildup in the tub or shower regularly.  Attach bath mats securely with double-sided non-slip rug tape.  Do not have throw rugs and other things on the floor that can make you trip. What can I do in the bedroom?  Use night lights.  Make sure that you have a light by your bed that is easy to reach.  Do not use any sheets or blankets that are too big for your bed. They should not hang down onto the floor.  Have a firm chair that has side arms. You can use this for support while you get dressed.  Do not have throw rugs and other things on the floor that can make you trip. What can I do in the kitchen?  Clean up any spills right away.  Avoid walking on wet floors.  Keep items that you use a lot in easy-to-reach places.  If you need to reach something above you, use a strong step stool that has a grab bar.  Keep electrical  cords out of the way.  Do not use floor polish or wax that makes floors slippery. If you must use wax, use non-skid floor wax.  Do not have throw rugs and other things on the floor that can make you trip. What can I do with my stairs?  Do not leave any items on the stairs.  Make sure that there are handrails on both sides of the stairs and use them. Fix handrails that are broken or loose. Make sure that handrails are as long as the stairways.  Check any carpeting to make sure that it is firmly attached to the stairs. Fix any carpet that is loose or worn.  Avoid having throw rugs at the top or bottom of the stairs. If you do have throw rugs, attach them to the floor with carpet tape.  Make sure that you have a light switch at the top of the stairs and the bottom of the stairs. If you do not have them, ask someone to add them for you. What  else can I do to help prevent falls?  Wear shoes that:  Do not have high heels.  Have rubber bottoms.  Are comfortable and fit you well.  Are closed at the toe. Do not wear sandals.  If you use a stepladder:  Make sure that it is fully opened. Do not climb a closed stepladder.  Make sure that both sides of the stepladder are locked into place.  Ask someone to hold it for you, if possible.  Clearly mark and make sure that you can see:  Any grab bars or handrails.  First and last steps.  Where the edge of each step is.  Use tools that help you move around (mobility aids) if they are needed. These include:  Canes.  Walkers.  Scooters.  Crutches.  Turn on the lights when you go into a dark area. Replace any light bulbs as soon as they burn out.  Set up your furniture so you have a clear path. Avoid moving your furniture around.  If any of your floors are uneven, fix them.  If there are any pets around you, be aware of where they are.  Review your medicines with your doctor. Some medicines can make you feel dizzy. This can increase your chance of falling. Ask your doctor what other things that you can do to help prevent falls. This information is not intended to replace advice given to you by your health care provider. Make sure you discuss any questions you have with your health care provider. Document Released: 08/26/2009 Document Revised: 04/06/2016 Document Reviewed: 12/04/2014 Elsevier Interactive Patient Education  2017 [employer].

## 2021-02-10 ENCOUNTER — Encounter: Payer: Self-pay | Admitting: Student in an Organized Health Care Education/Training Program

## 2021-02-10 ENCOUNTER — Ambulatory Visit
Payer: Medicare Other | Attending: Student in an Organized Health Care Education/Training Program | Admitting: Student in an Organized Health Care Education/Training Program

## 2021-02-10 ENCOUNTER — Other Ambulatory Visit: Payer: Self-pay

## 2021-02-10 VITALS — BP 140/59 | HR 58 | Temp 98.1°F | Resp 18 | Ht 71.0 in | Wt 210.0 lb

## 2021-02-10 DIAGNOSIS — M5136 Other intervertebral disc degeneration, lumbar region: Secondary | ICD-10-CM | POA: Insufficient documentation

## 2021-02-10 DIAGNOSIS — M5416 Radiculopathy, lumbar region: Secondary | ICD-10-CM | POA: Diagnosis not present

## 2021-02-10 DIAGNOSIS — G8929 Other chronic pain: Secondary | ICD-10-CM | POA: Diagnosis not present

## 2021-02-10 DIAGNOSIS — M47816 Spondylosis without myelopathy or radiculopathy, lumbar region: Secondary | ICD-10-CM | POA: Diagnosis not present

## 2021-02-10 DIAGNOSIS — M51369 Other intervertebral disc degeneration, lumbar region without mention of lumbar back pain or lower extremity pain: Secondary | ICD-10-CM | POA: Insufficient documentation

## 2021-02-10 DIAGNOSIS — M48062 Spinal stenosis, lumbar region with neurogenic claudication: Secondary | ICD-10-CM | POA: Insufficient documentation

## 2021-02-10 DIAGNOSIS — G894 Chronic pain syndrome: Secondary | ICD-10-CM | POA: Insufficient documentation

## 2021-02-10 MED ORDER — PREGABALIN 50 MG PO CAPS: 50.0000 mg | ORAL_CAPSULE | Freq: Two times a day (BID) | ORAL | 2 refills | Status: DC

## 2021-02-10 NOTE — Progress Notes (Signed)
Safety precautions to be maintained throughout the outpatient stay will include: orient to surroundings, keep bed in low position, maintain call bell within reach at all times, provide assistance with transfer out of bed and ambulation.  

## 2021-02-10 NOTE — Patient Instructions (Signed)
Epidural Steroid Injection Patient Information  Description: The epidural space surrounds the nerves as they exit the spinal cord.  In some patients, the nerves can be compressed and inflamed by a bulging disc or a tight spinal canal (spinal stenosis).  By injecting steroids into the epidural space, we can bring irritated nerves into direct contact with a potentially helpful medication.  These steroids act directly on the irritated nerves and can reduce swelling and inflammation which often leads to decreased pain.  Epidural steroids may be injected anywhere along the spine and from the neck to the low back depending upon the location of your pain.   After numbing the skin with local anesthetic (like Novocaine), a small needle is passed into the epidural space slowly.  You may experience a sensation of pressure while this is being done.  The entire block usually last less than 10 minutes.  Conditions which may be treated by epidural steroids:   Low back and leg pain  Neck and arm pain  Spinal stenosis  Post-laminectomy syndrome  Herpes zoster (shingles) pain  Pain from compression fractures  Preparation for the injection:  1. Do not eat any solid food or dairy products within 8 hours of your appointment.  2. You may drink clear liquids up to 3 hours before appointment.  Clear liquids include water, black coffee, juice or soda.  No milk or cream please. 3. You may take your regular medication, including pain medications, with a sip of water before your appointment  Diabetics should hold regular insulin (if taken separately) and take 1/2 normal NPH dos the morning of the procedure.  Carry some sugar containing items with you to your appointment. 4. A driver must accompany you and be prepared to drive you home after your procedure.  5. Bring all your current medications with your. 6. An IV may be inserted and sedation may be given at the discretion of the physician.   7. A blood pressure  cuff, EKG and other monitors will often be applied during the procedure.  Some patients may need to have extra oxygen administered for a short period. 8. You will be asked to provide medical information, including your allergies, prior to the procedure.  We must know immediately if you are taking blood thinners (like Coumadin/Warfarin)  Or if you are allergic to IV iodine contrast (dye). We must know if you could possible be pregnant.  Possible side-effects:  Bleeding from needle site  Infection (rare, may require surgery)  Nerve injury (rare)  Numbness & tingling (temporary)  Difficulty urinating (rare, temporary)  Spinal headache ( a headache worse with upright posture)  Light -headedness (temporary)  Pain at injection site (several days)  Decreased blood pressure (temporary)  Weakness in arm/leg (temporary)  Pressure sensation in back/neck (temporary)  Call if you experience:  Fever/chills associated with headache or increased back/neck pain.  Headache worsened by an upright position.  New onset weakness or numbness of an extremity below the injection site  Hives or difficulty breathing (go to the emergency room)  Inflammation or drainage at the infection site  Severe back/neck pain  Any new symptoms which are concerning to you  Please note:  Although the local anesthetic injected can often make your back or neck feel good for several hours after the injection, the pain will likely return.  It takes 3-7 days for steroids to work in the epidural space.  You may not notice any pain relief for at least that one week.    If effective, we will often do a series of three injections spaced 3-6 weeks apart to maximally decrease your pain.  After the initial series, we generally will wait several months before considering a repeat injection of the same type.  If you have any questions, please call [phone] [facility]  ____________________________________________________________________________________________  Preparing for your procedure (without sedation)  Procedure appointments are limited to planned procedures: . No Prescription Refills. . No disability issues will be discussed. . No medication changes will be discussed.  Instructions: . Oral Intake: Do not eat or drink anything for at least 6 hours prior to your procedure. (Exception: Blood Pressure Medication. See below.) . Transportation: Unless otherwise stated by your physician, you may drive yourself after the procedure. . Blood Pressure Medicine: Do not forget to take your blood pressure medicine with a sip of water the morning of the procedure. If your Diastolic (lower reading)is above 100 mmHg, elective cases will be cancelled/rescheduled. . Blood thinners: These will need to be stopped for procedures. Notify our staff if you are taking any blood thinners. Depending on which one you take, there will be specific instructions on how and when to stop it. . Diabetics on insulin: Notify the staff so that you can be scheduled 1st case in the morning. If your diabetes requires high dose insulin, take only  of your normal insulin dose the morning of the procedure and notify the staff that you have done so. . Preventing infections: Shower with an antibacterial soap the morning of your procedure.  . Build-up your immune system: Take 1000 mg of Vitamin C with every meal (3 times a day) the day prior to your procedure. Marland Kitchen Antibiotics: Inform the staff if you have a condition or reason that requires you to take antibiotics before dental procedures. . Pregnancy: If you are pregnant, call and cancel the procedure. . Sickness: If you have a cold, fever, or any active infections, call and cancel the procedure. . Arrival: You must be in the facility at least 30 minutes prior to your scheduled procedure. . Children: Do not bring any children with  you. . Dress appropriately: Bring dark clothing that you would not mind if they get stained. . Valuables: Do not bring any jewelry or valuables.  Reasons to call and reschedule or cancel your procedure: (Following these recommendations will minimize the risk of a serious complication.) . Surgeries: Avoid having procedures within 2 weeks of any surgery. (Avoid for 2 weeks before or after any surgery). . Flu Shots: Avoid having procedures within 2 weeks of a flu shots or . (Avoid for 2 weeks before or after immunizations). . Barium: Avoid having a procedure within 7-10 days after having had a radiological study involving the use of radiological contrast. (Myelograms, Barium swallow or enema study). . Heart attacks: Avoid any elective procedures or surgeries for the initial 6 months after a "Myocardial Infarction" (Heart Attack). . Blood thinners: It is imperative that you stop these medications before procedures. Let us know if you if you take any blood thinner.  . Infection: Avoid procedures during or within two weeks of an infection (including chest colds or gastrointestinal problems). Symptoms associated with infections include: Localized redness, fever, chills, night sweats or profuse sweating, burning sensation when voiding, cough, congestion, stuffiness, runny nose, sore throat, diarrhea, nausea, vomiting, cold or Flu symptoms, recent or current infections. It is specially important if the infection is over the area that we intend to treat. Marland Kitchen  Heart and lung problems: Symptoms that may suggest an active cardiopulmonary problem include: cough, chest pain, breathing difficulties or shortness of breath, dizziness, ankle swelling, uncontrolled high or unusually low blood pressure, and/or palpitations. If you are experiencing any of these symptoms, cancel your procedure and contact your primary care physician for an evaluation.  Remember:  Regular Business hours are:  Monday to Thursday 8:00 AM to 4:00  PM  Provider's Schedule: Milinda Pointer, MD:  Procedure days: Tuesday and Thursday 7:30 AM to 4:00 PM  Gillis Santa, MD:  Procedure days: Monday and Wednesday 7:30 AM to 4:00 PM ____________________________________________________________________________________________

## 2021-02-10 NOTE — Progress Notes (Addendum)
Patient: Frank Hamilton  Service Category: E/M  Provider: Gillis Santa, MD  DOB: 05-Jun-1941  DOS: 02/10/2021  Referring Provider: Steele Sizer, MD  MRN: [id number]  Setting: Ambulatory outpatient  PCP: Steele Sizer, MD  Type: New Patient  Specialty: Interventional Pain Management    Location: Office  Delivery: Face-to-face     Primary Reason(s) for Visit: Encounter for initial evaluation of one or more chronic problems (new to examiner) potentially causing chronic pain, and posing a threat to normal musculoskeletal function. (Level of risk: High) CC: Leg Pain and Hip Pain  HPI  Frank Hamilton is a 80 y.o. year old, male patient, who comes for the first time to our practice referred by Steele Sizer, MD for our initial evaluation of his chronic pain. He has Hypertension, benign; Hyperlipidemia; Kidney cysts; Osteoarthritis of both hands; Vitamin D deficiency; Claudication [facility]); PVD (peripheral vascular disease) with claudication ([place]); Senile purpura ([place]); Positive for macroalbuminuria; Spinal stenosis, lumbar region, with neurogenic claudication; Chronic radicular lumbar pain; Lumbar spondylosis; Lumbar degenerative disc disease; and Chronic pain syndrome on their problem list. Today he comes in for evaluation of his Leg Pain and Hip Pain  Pain Assessment: Location: Right Hip Radiating: radiates from right side of (R) hip to outside of right leg down to ankle. Onset: More than a month ago Duration: Acute pain Quality: Dull ("sensation something is there") Severity: 1 /10 (subjective, self-reported pain score)  Effect on ADL: "Was not able to sleep on right side, now finallt able to and now I feel weakness which makes me slow when walking or going on stairs" Timing: Intermittent Modifying factors: "Meloxicam, drinking coffee helps with the pain, sitting helps as well" BP: (!) 140/59  HR: (!) 58  Onset and Duration: Gradual 11/09/20.  Cause of pain: Unknown Severity: Getting  better Timing: Not influenced by the time of the day Aggravating Factors: Bending, Lifiting, Stooping , Twisting and Walking Alleviating Factors: Resting and Sitting Associated Problems: Inability to concentrate and Pain that does not allow patient to sleep Quality of Pain: Aching, Disabling, Distressing, Exhausting and Uncomfortable Previous Examinations or Tests: MRI scan Previous Treatments: The patient denies treatment  Frank Hamilton is a pleasant 80 year old male who presents with a chief complaint of low back pain that radiates down to his right hip into his right lateral leg to his ankle.  This started in December 2021.  States that he have been working outside in his garden when this started but does not recall any specific inciting event.  He states that he had a similar pain flare back in the mid 1990s.  Patient endorses pain when he is walking, bending, laying supine.  He has a history of osteoarthritis.  He is try to do exercises for low back pain at home.  Lumbar x-ray and MRI are below.  Patient denies any bowel or bladder dysfunction.  He has utilized Mobic, gabapentin, acetaminophen for his pain in the past.  He had limited response to gabapentin.   Meds   Current Outpatient Medications:  .  amLODipine (NORVASC) 10 MG tablet, Take 1 tablet (10 mg total) by mouth daily., Disp: 90 tablet, Rfl: 1 .  aspirin 81 MG chewable tablet, Chew 81 mg by mouth., Disp: , Rfl:  .  atorvastatin (LIPITOR) 40 MG tablet, Take 1 tablet (40 mg total) by mouth daily., Disp: 90 tablet, Rfl: 1 .  Cholecalciferol (VITAMIN D) 2000 units CAPS, Take 1 capsule (2,000 Units total) by mouth daily., Disp: 30 capsule, Rfl:  .  glucosamine-chondroitin 500-400 MG tablet, Take 1 tablet by mouth in the morning and at bedtime. , Disp: , Rfl:  .  hydrochlorothiazide (HYDRODIURIL) 12.5 MG tablet, Take 1 tablet (12.5 mg total) by mouth daily., Disp: 90 tablet, Rfl: 1 .  losartan (COZAAR) 50 MG tablet, Take 1 tablet (50 mg total)  by mouth daily., Disp: 90 tablet, Rfl: 1 .  meloxicam (MOBIC) 15 MG tablet, Take 1 tablet (15 mg total) by mouth daily., Disp: 30 tablet, Rfl: 0 .  omega-3 acid ethyl esters (LOVAZA) 1 g capsule, Take 2 capsules (2 g total) by mouth 2 (two) times daily., Disp: 360 capsule, Rfl: 1 .  pregabalin (LYRICA) 50 MG capsule, Take 1 capsule (50 mg total) by mouth 2 (two) times daily., Disp: 60 capsule, Rfl: 2  Imaging Review   Lumbosacral Imaging: Lumbar MR wo contrast: Results for orders placed during the hospital encounter of 12/09/20  MR Lumbar Spine Wo Contrast  Narrative CLINICAL DATA:  Lumbosacral back pain. Right hip and leg pain and tingling over the last month.  EXAM: MRI LUMBAR SPINE WITHOUT CONTRAST  TECHNIQUE: Multiplanar, multisequence MR imaging of the lumbar spine was performed. No intravenous contrast was administered.  COMPARISON:  Radiography 11/15/2020  FINDINGS: Segmentation:  5 lumbar type vertebral bodies.  Alignment: Mild lower lumbar curvature convex to the left. 2 mm degenerative anterolisthesis L4-5.  Vertebrae:  No fracture or primary bone lesion.  Conus medullaris and cauda equina: Conus extends to the L2 level. Conus and cauda equina appear normal.  Paraspinal and other soft tissues: Left renal cyst, not primarily or completely evaluated.  Disc levels:  T12-L1: Normal  L1-2: Mild disc bulge.  No significant stenosis.  L2-3: Endplate osteophytes and bulging of the disc. Mild facet and ligamentous prominence. Mild multifactorial stenosis, most prominent in the left lateral recess. Left foraminal stenosis that could affect the L2 nerve.  L3-4: Disc herniation more prominent towards the right with caudal migration down behind L4. Facet and ligamentous hypertrophy. Severe stenosis, particularly on the right. Neural compression seems certain at this level, particularly on the right affecting at least the L4 nerve.  L4-5: Endplate osteophytes and  bulging of the disc. Facet and ligamentous hypertrophy. Severe multifactorial stenosis that could cause neural compression on either or both sides. Foraminal stenosis, right worse than left.  L5-S1: Endplate osteophytes and mild bulging of the disc. Facet degeneration and hypertrophy, more pronounced on the left than the right. Bony foraminal stenosis on the left that could affect the exiting L5 nerve.  IMPRESSION: 1. L3-4: Disc herniation more prominent towards the right with caudal migration down behind L4. Facet and ligamentous hypertrophy. Severe multifactorial stenosis at this level, particularly on the right. Neural compression seems certain at this level, particularly on the right affecting at least the L4 nerve. 2. L4-5: Severe multifactorial spinal stenosis that could cause neural compression on either or both sides. Foraminal stenosis right worse than left. 3. L2-3: Mild multifactorial stenosis, most prominent in the left lateral recess. Left foraminal stenosis that could affect the left L2 nerve. 4. L5-S1: Left foraminal stenosis because of facet osteophytes and bulging disc material could affect the exiting L5 nerve.   Electronically Signed By: Nelson Chimes M.D. On: 12/09/2020 09:58  DG Lumbar Spine Complete  Narrative CLINICAL DATA:  Low back pain with radiculitis.  EXAM: LUMBAR SPINE - COMPLETE 4+ VIEW  COMPARISON:  None.  FINDINGS: No evidence of acute fracture or malalignment. Vertebral body heights are maintained when accounting for  obliquity. Mild levocurvature of the lower lumbar spine. Mild retrolisthesis of L2 on L3. Moderate to severe degenerative disc disease at L2-L3 and L4-L5 with disc height loss, endplate sclerosis, and endplate spurring. Mild-to-moderate multilevel degenerative disease at other levels. Lower lumbar facet arthropathy. Aortic atherosclerosis.  IMPRESSION: 1. No evidence of acute fracture or traumatic malalignment. 2.  Moderate to severe degenerative disc disease at L2-L3 and L4-L5 with lower lumbar facet hypertrophy. MRI could better characterize the canal and foramina if clinically indicated.   Electronically Signed By: Margaretha Sheffield MD On: 11/15/2020 13:50  DG HIP UNILAT WITH PELVIS 2-3 VIEWS LEFT  Narrative CLINICAL DATA:  No known injury.  Hip joint pain.  EXAM: DG HIP (WITH OR WITHOUT PELVIS) 2-3V LEFT  COMPARISON:  08/02/2014.  FINDINGS: Degenerative changes lumbar spine and both hips. No acute bony or joint abnormality. Pelvic calcifications consistent phleboliths. Surgical clips right lower pelvis.  IMPRESSION: Degenerative changes lumbar spine and both hips. No acute bony abnormality identified .   Electronically Signed By: Marcello Moores  Register On: 07/12/2016 12:52   Complexity Note: Imaging results reviewed. Results shared with Mr. Casillas, using Layman's terms.                         ROS  Cardiovascular: High blood pressure Pulmonary or Respiratory: No reported pulmonary signs or symptoms such as wheezing and difficulty taking a deep full breath (Asthma), difficulty blowing air out (Emphysema), coughing up mucus (Bronchitis), persistent dry cough, or temporary stoppage of breathing during sleep Neurological: No reported neurological signs or symptoms such as seizures, abnormal skin sensations, urinary and/or fecal incontinence, being born with an abnormal open spine and/or a tethered spinal cord Psychological-Psychiatric: No reported psychological or psychiatric signs or symptoms such as difficulty sleeping, anxiety, depression, delusions or hallucinations (schizophrenial), mood swings (bipolar disorders) or suicidal ideations or attempts Gastrointestinal: No reported gastrointestinal signs or symptoms such as vomiting or evacuating blood, reflux, heartburn, alternating episodes of diarrhea and constipation, inflamed or scarred liver, or pancreas or irrregular and/or  infrequent bowel movements Genitourinary: Kidney disease Hematological: No reported hematological signs or symptoms such as prolonged bleeding, low or poor functioning platelets, bruising or bleeding easily, hereditary bleeding problems, low energy levels due to low hemoglobin or being anemic Endocrine: No reported endocrine signs or symptoms such as high or low blood sugar, rapid heart rate due to high thyroid levels, obesity or weight gain due to slow thyroid or thyroid disease Rheumatologic: Rheumatoid arthritis Musculoskeletal: Negative for myasthenia gravis, muscular dystrophy, multiple sclerosis or malignant hyperthermia Work History: Retired  Allergies  Mr. Rawl is allergic to gabapentin and sulfa antibiotics.  Laboratory Chemistry Profile   Renal Lab Results  Component Value Date   BUN 18 11/15/2020   CREATININE 0.69 (L) 11/15/2020   LABCREA 82 11/15/2020   BCR 26 (H) 11/15/2020   GFRAA 106 11/15/2020   GFRNONAA 92 11/15/2020   PROTEINUR 30 mg/dL 08/01/2014     Electrolytes Lab Results  Component Value Date   NA 139 11/15/2020   K 4.4 11/15/2020   CL 102 11/15/2020   CALCIUM 10.0 11/15/2020   MG 1.9 06/27/2017     Hepatic Lab Results  Component Value Date   AST 21 11/15/2020   ALT 31 11/15/2020   ALBUMIN 4.3 06/27/2017   ALKPHOS 49 06/27/2017   LIPASE 110 08/02/2014     ID No results found for: LYMEIGGIGMAB, HIV, SARSCOV2NAA, STAPHAUREUS, MRSAPCR, HCVAB, PREGTESTUR, RMSFIGG, QFVRPH1IGG, QFVRPH2IGG, LYMEIGGIGMAB  Bone Lab Results  Component Value Date   VD25OH 38 06/27/2017     Endocrine Lab Results  Component Value Date   GLUCOSE 69 11/15/2020   GLUCOSEU Negative 08/01/2014   TSH 3.89 03/19/2017     Neuropathy Lab Results  Component Value Date   QIONGEXB28 413 06/27/2017     CNS No results found for: COLORCSF, APPEARCSF, RBCCOUNTCSF, WBCCSF, POLYSCSF, LYMPHSCSF, EOSCSF, PROTEINCSF, GLUCCSF, JCVIRUS, CSFOLI, IGGCSF, LABACHR, ACETBL,  LABACHR, ACETBL   Inflammation (CRP: Acute  ESR: Chronic) No results found for: CRP, ESRSEDRATE, LATICACIDVEN   Rheumatology No results found for: RF, ANA, LABURIC, URICUR, LYMEIGGIGMAB, LYMEABIGMQN, HLAB27   Coagulation Lab Results  Component Value Date   PLT 194 11/15/2020     Cardiovascular Lab Results  Component Value Date   CKTOTAL 222 09/27/2013   CKMB 7.2 (H) 09/27/2013   TROPONINI < 0.02 08/01/2014   HGB 15.1 11/15/2020   HCT 44.7 11/15/2020     Screening No results found for: SARSCOV2NAA, COVIDSOURCE, STAPHAUREUS, MRSAPCR, HCVAB, HIV, PREGTESTUR   Cancer No results found for: CEA, CA125, LABCA2   Allergens No results found for: ALMOND, APPLE, ASPARAGUS, AVOCADO, BANANA, BARLEY, BASIL, BAYLEAF, GREENBEAN, LIMABEAN, WHITEBEAN, BEEFIGE, REDBEET, BLUEBERRY, BROCCOLI, CABBAGE, MELON, CARROT, CASEIN, CASHEWNUT, CAULIFLOWER, CELERY     Note: Lab results reviewed.  PFSH  Drug: Mr. Kopke  reports no history of drug use. Alcohol:  reports current alcohol use of about 1.0 standard drink of alcohol per week. Tobacco:  reports that he quit smoking about 46 years ago. His smoking use included cigarettes. He has a 20.00 pack-year smoking history. He has never used smokeless tobacco. Medical:  has a past medical history of Hyperlipidemia, Hypertension, Kidney cysts, Osteoarthritis, PVD (peripheral vascular disease) ([place]), and Vitamin D deficiency. Family: family history includes Diabetes in his maternal grandfather and sister; Heart disease in his mother and sister; Stroke in his father.  Past Surgical History:  Procedure Laterality Date  . bowel obstruction  2011  . CATARACT EXTRACTION, BILATERAL  2017  . HERNIA REPAIR  2009 and 2010  . TONSILLECTOMY     age 55 or 39   Active Ambulatory Problems    Diagnosis Date Noted  . Hypertension, benign 08/03/2015  . Hyperlipidemia 08/03/2015  . Kidney cysts 12/15/2013  . Osteoarthritis of both hands 05/01/2016  . Vitamin D  deficiency 05/02/2016  . Claudication ([place]) 04/23/2018  . PVD (peripheral vascular disease) with claudication ([place]) 07/02/2018  . Senile purpura ([place]) 07/02/2018  . Positive for macroalbuminuria 08/06/2018  . Spinal stenosis, lumbar region, with neurogenic claudication 02/10/2021  . Chronic radicular lumbar pain 02/10/2021  . Lumbar spondylosis 02/10/2021  . Lumbar degenerative disc disease 02/10/2021  . Chronic pain syndrome 02/10/2021   Resolved Ambulatory Problems    Diagnosis Date Noted  . B12 deficiency 05/02/2016  . Acute pain of left hip 07/12/2016  . Acute non-recurrent maxillary sinusitis 08/07/2016   Past Medical History:  Diagnosis Date  . Hypertension   . Osteoarthritis   . PVD (peripheral vascular disease) ([place])    Constitutional Exam  General appearance: Well nourished, well developed, and well hydrated. In no apparent acute distress Vitals:   02/10/21 1006  BP: (!) 140/59  Pulse: (!) 58  Resp: 18  Temp: 98.1 F (36.7 C)  SpO2: 96%  Weight: 210 lb (95.3 kg)  Height: _0  (1.803 m)   BMI Assessment: Estimated body mass index is 29.29 kg/m as calculated from the following:   Height as of this encounter:  $'5\' 11"'G$  (1.803 m).   Weight as of this encounter: 210 lb (95.3 kg).  BMI interpretation table: BMI level Category Range association with higher incidence of chronic pain  <18 kg/m2 Underweight   18.5-24.9 kg/m2 Ideal body weight   25-29.9 kg/m2 Overweight Increased incidence by 20%  30-34.9 kg/m2 Obese (Class I) Increased incidence by 68%  35-39.9 kg/m2 Severe obesity (Class II) Increased incidence by 136%  >40 kg/m2 Extreme obesity (Class III) Increased incidence by 254%   Patient's current BMI Ideal Body weight  Body mass index is 29.29 kg/m. Ideal body weight: 75.3 kg (166 lb 0.1 oz) Adjusted ideal body weight: 83.3 kg (183 lb 9.7 oz)   BMI Readings from Last 4 Encounters:  02/10/21 29.29 kg/m  01/11/21 29.65 kg/m  11/15/20 28.79 kg/m   01/09/20 28.17 kg/m   Wt Readings from Last 4 Encounters:  02/10/21 210 lb (95.3 kg)  01/11/21 212 lb 9.6 oz (96.4 kg)  11/15/20 206 lb 6.4 oz (93.6 kg)  01/09/20 202 lb (91.6 kg)    Psych/Mental status: Alert, oriented x 3 (person, place, & time)       Eyes: PERLA Respiratory: No evidence of acute respiratory distress  Lumbar Exam  Skin & Axial Inspection: No masses, redness, or swelling Alignment: Symmetrical Functional ROM: Unrestricted ROM       Stability: No instability detected Muscle Tone/Strength: Functionally intact. No obvious neuro-muscular anomalies detected. Sensory (Neurological): Musculoskeletal pain pattern + pain with facet loading  Gait & Posture Assessment  Ambulation: Unassisted Gait: Relatively normal for age and body habitus Posture: WNL   Lower Extremity Exam    Side: Right lower extremity  Side: Left lower extremity  Stability: No instability observed          Stability: No instability observed          Skin & Extremity Inspection: Skin color, temperature, and hair growth are WNL. No peripheral edema or cyanosis. No masses, redness, swelling, asymmetry, or associated skin lesions. No contractures.  Skin & Extremity Inspection: Skin color, temperature, and hair growth are WNL. No peripheral edema or cyanosis. No masses, redness, swelling, asymmetry, or associated skin lesions. No contractures.  Functional ROM: Unrestricted ROM                  Functional ROM: Unrestricted ROM                  Muscle Tone/Strength: Functionally intact. No obvious neuro-muscular anomalies detected.  Muscle Tone/Strength: Functionally intact. No obvious neuro-muscular anomalies detected.  Sensory (Neurological): Unimpaired        Sensory (Neurological): Unimpaired        DTR: Patellar: deferred today Achilles: deferred today Plantar: deferred today  DTR: Patellar: deferred today Achilles: deferred today Plantar: deferred today  Palpation: No palpable anomalies   Palpation: No palpable anomalies   Assessment  Primary Diagnosis & Pertinent Problem List: The primary encounter diagnosis was Spinal stenosis, lumbar region, with neurogenic claudication. Diagnoses of Lumbar radiculopathy (RIGHT), Chronic radicular lumbar pain (RIGHT), Lumbar facet arthropathy, Lumbar spondylosis, Lumbar degenerative disc disease, and Chronic pain syndrome were also pertinent to this visit.  Visit Diagnosis (New problems to examiner): 1. Spinal stenosis, lumbar region, with neurogenic claudication   2. Lumbar radiculopathy (RIGHT)   3. Chronic radicular lumbar pain (RIGHT)   4. Lumbar facet arthropathy   5. Lumbar spondylosis   6. Lumbar degenerative disc disease   7. Chronic pain syndrome    Plan of Care (Initial workup plan)  Note: Mr. Vittorio was reminded that as per protocol, today's visit has been an evaluation only. We have not taken over the patient's controlled substance management.  Patient has severe multilevel lumbar spinal stenosis with associated neurogenic claudication.  I reviewed his MRI in detail with him today.  It was up he also has associated multilevel lumbar neuroforaminal stenosis as well as disc herniations and facet arthropathy that is contributing to his radicular pain.  Overall it is well controlled at this time and the patient is not complaining of a flareup.  Should he have one in the future we can consider doing a lumbar epidural steroid injection.  In the interim, I would like the patient to discontinue gabapentin and start Lyrica as below.  Risk and benefits reviewed.  1. Spinal stenosis, lumbar region, with neurogenic claudication - pregabalin (LYRICA) 50 MG capsule; Take 1 capsule (50 mg total) by mouth 2 (two) times daily.  Dispense: 60 capsule; Refill: 2 - Lumbar Epidural Injection; Standing  2. Lumbar radiculopathy (RIGHT) - pregabalin (LYRICA) 50 MG capsule; Take 1 capsule (50 mg total) by mouth 2 (two) times daily.  Dispense: 60  capsule; Refill: 2 - Lumbar Epidural Injection; Standing  3. Chronic radicular lumbar pain (RIGHT) - pregabalin (LYRICA) 50 MG capsule; Take 1 capsule (50 mg total) by mouth 2 (two) times daily.  Dispense: 60 capsule; Refill: 2 - Lumbar Epidural Injection; Standing  4. Lumbar facet arthropathy - pregabalin (LYRICA) 50 MG capsule; Take 1 capsule (50 mg total) by mouth 2 (two) times daily.  Dispense: 60 capsule; Refill: 2  5. Lumbar spondylosis - pregabalin (LYRICA) 50 MG capsule; Take 1 capsule (50 mg total) by mouth 2 (two) times daily.  Dispense: 60 capsule; Refill: 2  6. Lumbar degenerative disc disease - pregabalin (LYRICA) 50 MG capsule; Take 1 capsule (50 mg total) by mouth 2 (two) times daily.  Dispense: 60 capsule; Refill: 2  7. Chronic pain syndrome - pregabalin (LYRICA) 50 MG capsule; Take 1 capsule (50 mg total) by mouth 2 (two) times daily.  Dispense: 60 capsule; Refill: 2 - Lumbar Epidural Injection; Standing    Procedure Orders     Lumbar Epidural Injection   Pharmacotherapy (current): Medications ordered:  Meds ordered this encounter  Medications  . pregabalin (LYRICA) 50 MG capsule    Sig: Take 1 capsule (50 mg total) by mouth 2 (two) times daily.    Dispense:  60 capsule    Refill:  2   Medications administered during this visit: Fenton Malling. Citro had no medications administered during this visit.   Pharmacological management options:  Opioid Analgesics: The patient was informed that there is no guarantee that he would be a candidate for opioid analgesics. The decision will be made following CDC guidelines. This decision will be based on the results of diagnostic studies, as well as Mr. Waymire risk profile.   Membrane stabilizer: Has tried gabapentin, limited response.  Trial of Lyrica.  Muscle relaxant: To be determined at a later time  NSAID: To be determined at a later time  Other analgesic(s): To be determined at a later time   Interventional  management options: Mr. Zaino was informed that there is no guarantee that he would be a candidate for interventional therapies. The decision will be based on the results of diagnostic studies, as well as Mr. Eveland risk profile.  Procedure(s) under consideration:  L-ESI MILD (minimally invasive lumbar decompression for lumbar spinal stenosis and neurogenic claudication)   Provider-requested follow-up: Return if  symptoms worsen or fail to improve.  Future Appointments  Date Time Provider [place]  03/09/2021 11:00 AM Steele Sizer, MD [facility]  05/02/2021  3:30 PM Ralene Bathe, MD [facility] None  01/17/2022 10:40 AM [facility] - NURSE HEALTH ADVISOR [place] [facility]    Note by: Gillis Santa, MD Date: 02/10/2021; Time: 1:08 PM

## 2021-03-08 NOTE — Progress Notes (Signed)
Name: Frank Hamilton   MRN: [id number]    DOB: 1941-08-02   Date:03/09/2021       Progress Note  Subjective  Chief Complaint  Follow up   HPI   HTN: taking medication and denies side effects, no chest pain or palpitation He is  on ARB, HCTZ and Norvasc . He needs refills of his medication. History of albuminuria, saw Dr. Abigail Butts in January   Skin lesions: on chest wall, red , bumpy and does not seem to be moving around,referral was placed to dermatologist but he did not get an appointment yet, we will reach out to referral coordinator   Dyslipidemia: Atorvastatinand lovazasince triglycerideswasover 500, also taking aspirin daily . He denies side effects of medication, lab work from January 2022 reviewed, not fasting and triglycerides was 248 , HDL was low at 28, LDL at goal at 63 . Discussed eating fish twice a week , tree nuts  GERD: he states he is avoiding eating late at night and symptoms are better controlled. Triggered by greasy food he does not eat spicy food   OA: mostly on hands/right wrist  taking otc glucosamine, not on nsaid's except at most once a week takes Naproxen when very active.He saw Ortho in 2020 and was given reassurance. Aching pain, unable to make complete fist with left hand   Low back pain: he was seen by Dr. Holley Raring, he has MRI ( below ), he states since started on Lyrica at night pain has resolved, he has leg fatigue when walking, explained that is likely from PVD and not the radiculitis.  No bowel or bladder incontinence. He had PT in the past, but has not been doing home PT   IMPRESSION: 1. L3-4: Disc herniation more prominent towards the right with caudal migration down behind L4. Facet and ligamentous hypertrophy. Severe multifactorial stenosis at this level, particularly on the right. Neural compression seems certain at this level, particularly on the right affecting at least the L4 nerve. 2. L4-5: Severe multifactorial spinal stenosis that  could cause neural compression on either or both sides. Foraminal stenosis right worse than left. 3. L2-3: Mild multifactorial stenosis, most prominent in the left lateral recess. Left foraminal stenosis that could affect the left L2 nerve. 4. L5-S1: Left foraminal stenosis because of facet osteophytes and bulging disc material could affect the exiting L5 nerve.   PVD with claudication: mildly abnormal ABI he had PT ,  he can walk down his driveway - 2/3 mile without having to stop, he feels aching   History of left kidney cyst : that went down in size from 2014 to 2015 and per studies stable, no hematuria or flank pain. Unchanged  AR: seasonal , taking Loratadine daily, still has nasal congestion but doing okay with medication. No rhinorrhea.   Mild aortic stenosis: had echo done in 2018, denies chest pain, palpitation, dizziness, and would like to hold off on repeating Echo  Patient Active Problem List   Diagnosis Date Noted  . Spinal stenosis, lumbar region, with neurogenic claudication 02/10/2021  . Chronic radicular lumbar pain 02/10/2021  . Lumbar spondylosis 02/10/2021  . Lumbar degenerative disc disease 02/10/2021  . Chronic pain syndrome 02/10/2021  . Positive for macroalbuminuria 08/06/2018  . PVD (peripheral vascular disease) with claudication ([place]) 07/02/2018  . Senile purpura ([place]) 07/02/2018  . Claudication ([place]) 04/23/2018  . Vitamin D deficiency 05/02/2016  . Osteoarthritis of both hands 05/01/2016  . Hypertension, benign 08/03/2015  . Hyperlipidemia 08/03/2015  . Kidney  cysts 12/15/2013    Past Surgical History:  Procedure Laterality Date  . bowel obstruction  2011  . CATARACT EXTRACTION, BILATERAL  2017  . HERNIA REPAIR  2009 and 2010  . TONSILLECTOMY     age 63 or 66    Family History  Problem Relation Age of Onset  . Heart disease Mother   . Stroke Father   . Heart disease Sister   . Diabetes Sister   . Diabetes Maternal Grandfather     Social  History   Tobacco Use  . Smoking status: Former Smoker    Packs/day: 1.00    Years: 20.00    Pack years: 20.00    Types: Cigarettes    Quit date: 06/08/1974    Years since quitting: 46.7  . Smokeless tobacco: Never Used  Substance Use Topics  . Alcohol use: Yes    Alcohol/week: 1.0 standard drink    Types: 1 Cans of beer per week     Current Outpatient Medications:  .  amLODipine (NORVASC) 10 MG tablet, Take 1 tablet (10 mg total) by mouth daily., Disp: 90 tablet, Rfl: 1 .  aspirin 81 MG chewable tablet, Chew 81 mg by mouth., Disp: , Rfl:  .  atorvastatin (LIPITOR) 40 MG tablet, Take 1 tablet (40 mg total) by mouth daily., Disp: 90 tablet, Rfl: 1 .  Cholecalciferol (VITAMIN D) 2000 units CAPS, Take 1 capsule (2,000 Units total) by mouth daily., Disp: 30 capsule, Rfl:  .  glucosamine-chondroitin 500-400 MG tablet, Take 1 tablet by mouth in the morning and at bedtime. , Disp: , Rfl:  .  hydrochlorothiazide (HYDRODIURIL) 12.5 MG tablet, Take 1 tablet (12.5 mg total) by mouth daily., Disp: 90 tablet, Rfl: 1 .  losartan (COZAAR) 50 MG tablet, Take 1 tablet (50 mg total) by mouth daily., Disp: 90 tablet, Rfl: 1 .  meloxicam (MOBIC) 15 MG tablet, Take 1 tablet (15 mg total) by mouth daily., Disp: 30 tablet, Rfl: 0 .  omega-3 acid ethyl esters (LOVAZA) 1 g capsule, Take 2 capsules (2 g total) by mouth 2 (two) times daily., Disp: 360 capsule, Rfl: 1 .  pregabalin (LYRICA) 50 MG capsule, Take 1 capsule (50 mg total) by mouth 2 (two) times daily., Disp: 60 capsule, Rfl: 2  Allergies  Allergen Reactions  . Gabapentin Rash    Headache also reported  . Sulfa Antibiotics Itching and Rash    I personally reviewed active problem list, medication list, allergies, family history, social history, health maintenance with the patient/caregiver today.   ROS  Constitutional: Negative for fever or weight change.  Respiratory: positive  For intermittent dry  cough but no  shortness of breath.    Cardiovascular: Negative for chest pain or palpitations.  Gastrointestinal: Negative for abdominal pain, no bowel changes.  Musculoskeletal: positive for gait problem and  joint swelling.  Skin: Negative for rash.  Neurological: Negative for dizziness or headache.  No other specific complaints in a complete review of systems (except as listed in HPI above).  Objective  Vitals:   03/09/21 1055  BP: (!) 136/52  Pulse: 66  Resp: 16  Temp: 98 F (36.7 C)  TempSrc: Oral  SpO2: 99%  Weight: 211 lb (95.7 kg)  Height: 5\' 11"  (1.803 m)    Body mass index is 29.43 kg/m.  Physical Exam  Constitutional: Patient appears well-developed and well-nourished.  No distress.  HEENT: head atraumatic, normocephalic, pupils equal and reactive to light,  neck supple Cardiovascular: Normal rate, regular rhythm  and normal heart sounds. 2/6  murmur heard. No BLE edema. Pulmonary/Chest: Effort normal and breath sounds normal. No respiratory distress. Abdominal: Soft.  There is no tenderness. Muscular Skeletal: decreased rom of lumbar spine, pain when doing right lateral bending and extension, negative straight leg raise  Psychiatric: Patient has a normal mood and affect. behavior is normal. Judgment and thought content normal.  PHQ2/9: Depression screen [facility] 2/9 03/09/2021 02/10/2021 01/11/2021 11/15/2020 01/09/2020  Decreased Interest 0 0 1 0 0  Down, Depressed, Hopeless 0 0 0 0 0  PHQ - 2 Score 0 0 1 0 0  Altered sleeping - - 0 - -  Tired, decreased energy - - 0 - -  Change in appetite - - 0 - -  Feeling bad or failure about yourself  - - 0 - -  Trouble concentrating - - 0 - -  Moving slowly or fidgety/restless - - 0 - -  Suicidal thoughts - - 0 - -  PHQ-9 Score - - 1 - -  Difficult doing work/chores - - Not difficult at all - -  Some recent data might be hidden    phq 9 is negative   Fall Risk: Fall Risk  03/09/2021 02/10/2021 01/11/2021 11/15/2020 01/09/2020  Falls in the past year? 1 0 1 1 0   Number falls in past yr: 0 0 0 0 0  Injury with Fall? 0 0 0 0 0  Risk for fall due to : - - History of fall(s) History of fall(s) No Fall Risks  Follow up - - Falls prevention discussed - Falls prevention discussed     Functional Status Survey: Is the patient deaf or have difficulty hearing?: Yes Does the patient have difficulty seeing, even when wearing glasses/contacts?: No Does the patient have difficulty concentrating, remembering, or making decisions?: No Does the patient have difficulty walking or climbing stairs?: Yes Does the patient have difficulty dressing or bathing?: No Does the patient have difficulty doing errands alone such as visiting a doctor's office or shopping?: No    Assessment & Plan  1. PVD (peripheral vascular disease) with claudication ([place])  Advised him to walk more and join silver sneakers   2. Low back pain with radiation  Doing better with lyrica   3. Dyslipidemia  - atorvastatin (LIPITOR) 40 MG tablet; Take 1 tablet (40 mg total) by mouth daily.  Dispense: 90 tablet; Refill: 1 - omega-3 acid ethyl esters (LOVAZA) 1 g capsule; Take 2 capsules (2 g total) by mouth 2 (two) times daily.  Dispense: 360 capsule; Refill: 1  4. Essential hypertension  - amLODipine (NORVASC) 10 MG tablet; Take 1 tablet (10 mg total) by mouth daily.  Dispense: 90 tablet; Refill: 1 - hydrochlorothiazide (HYDRODIURIL) 12.5 MG tablet; Take 1 tablet (12.5 mg total) by mouth daily.  Dispense: 90 tablet; Refill: 1 - losartan (COZAAR) 50 MG tablet; Take 1 tablet (50 mg total) by mouth daily.  Dispense: 90 tablet; Refill: 1  5. Primary osteoarthritis involving multiple joints   6. Senile purpura ([place])  Reassurance given   7. Vitamin D deficiency   8. Primary osteoarthritis of both hands   9. DDD (degenerative disc disease), lumbar

## 2021-03-09 ENCOUNTER — Encounter: Payer: Self-pay | Admitting: Family Medicine

## 2021-03-09 ENCOUNTER — Ambulatory Visit (INDEPENDENT_AMBULATORY_CARE_PROVIDER_SITE_OTHER): Payer: Medicare Other | Admitting: Family Medicine

## 2021-03-09 ENCOUNTER — Other Ambulatory Visit: Payer: Self-pay

## 2021-03-09 VITALS — BP 136/52 | HR 66 | Temp 98.0°F | Resp 16 | Ht 71.0 in | Wt 211.0 lb

## 2021-03-09 DIAGNOSIS — M19042 Primary osteoarthritis, left hand: Secondary | ICD-10-CM

## 2021-03-09 DIAGNOSIS — M19041 Primary osteoarthritis, right hand: Secondary | ICD-10-CM

## 2021-03-09 DIAGNOSIS — M545 Low back pain, unspecified: Secondary | ICD-10-CM

## 2021-03-09 DIAGNOSIS — I10 Essential (primary) hypertension: Secondary | ICD-10-CM | POA: Diagnosis not present

## 2021-03-09 DIAGNOSIS — M8949 Other hypertrophic osteoarthropathy, multiple sites: Secondary | ICD-10-CM

## 2021-03-09 DIAGNOSIS — M5136 Other intervertebral disc degeneration, lumbar region: Secondary | ICD-10-CM

## 2021-03-09 DIAGNOSIS — E785 Hyperlipidemia, unspecified: Secondary | ICD-10-CM | POA: Diagnosis not present

## 2021-03-09 DIAGNOSIS — I739 Peripheral vascular disease, unspecified: Secondary | ICD-10-CM

## 2021-03-09 DIAGNOSIS — E559 Vitamin D deficiency, unspecified: Secondary | ICD-10-CM

## 2021-03-09 DIAGNOSIS — D692 Other nonthrombocytopenic purpura: Secondary | ICD-10-CM

## 2021-03-09 DIAGNOSIS — M159 Polyosteoarthritis, unspecified: Secondary | ICD-10-CM

## 2021-03-09 MED ORDER — OMEGA-3-ACID ETHYL ESTERS 1 G PO CAPS: 2.0000 | ORAL_CAPSULE | Freq: Two times a day (BID) | ORAL | 1 refills | Status: DC

## 2021-03-09 MED ORDER — LOSARTAN POTASSIUM 50 MG PO TABS: 50.0000 mg | ORAL_TABLET | Freq: Every day | ORAL | 1 refills | Status: DC

## 2021-03-09 MED ORDER — HYDROCHLOROTHIAZIDE 12.5 MG PO TABS: 12.5000 mg | ORAL_TABLET | Freq: Every day | ORAL | 1 refills | Status: DC

## 2021-03-09 MED ORDER — AMLODIPINE BESYLATE 10 MG PO TABS: 10.0000 mg | ORAL_TABLET | Freq: Every day | ORAL | 1 refills | Status: DC

## 2021-03-09 MED ORDER — ATORVASTATIN CALCIUM 40 MG PO TABS
40.0000 mg | ORAL_TABLET | Freq: Every day | ORAL | 1 refills | Status: DC
Start: 2021-03-09 — End: 2021-09-09

## 2021-03-09 NOTE — Patient Instructions (Signed)
Dermatologist  appt on 6/20 at 3:30 pm with Dr. Nehemiah Massed

## 2021-03-14 DIAGNOSIS — I10 Essential (primary) hypertension: Secondary | ICD-10-CM | POA: Diagnosis not present

## 2021-03-14 DIAGNOSIS — R809 Proteinuria, unspecified: Secondary | ICD-10-CM | POA: Diagnosis not present

## 2021-05-02 ENCOUNTER — Other Ambulatory Visit: Payer: Self-pay

## 2021-05-02 ENCOUNTER — Ambulatory Visit: Payer: Medicare Other | Admitting: Dermatology

## 2021-05-02 DIAGNOSIS — D229 Melanocytic nevi, unspecified: Secondary | ICD-10-CM | POA: Diagnosis not present

## 2021-05-02 DIAGNOSIS — L814 Other melanin hyperpigmentation: Secondary | ICD-10-CM | POA: Diagnosis not present

## 2021-05-02 DIAGNOSIS — L820 Inflamed seborrheic keratosis: Secondary | ICD-10-CM | POA: Diagnosis not present

## 2021-05-02 DIAGNOSIS — L821 Other seborrheic keratosis: Secondary | ICD-10-CM | POA: Diagnosis not present

## 2021-05-02 DIAGNOSIS — Z1283 Encounter for screening for malignant neoplasm of skin: Secondary | ICD-10-CM | POA: Diagnosis not present

## 2021-05-02 DIAGNOSIS — L570 Actinic keratosis: Secondary | ICD-10-CM

## 2021-05-02 DIAGNOSIS — D1800 Hemangioma unspecified site: Secondary | ICD-10-CM

## 2021-05-02 DIAGNOSIS — L578 Other skin changes due to chronic exposure to nonionizing radiation: Secondary | ICD-10-CM

## 2021-05-02 NOTE — Progress Notes (Signed)
   New Patient Visit  Subjective  Frank Hamilton is a 80 y.o. male who presents for the following: Other (Spots of chest and back). The patient presents for Upper Body Skin Exam (UBSE) for skin cancer screening and mole check.  The following portions of the chart were reviewed this encounter and updated as appropriate:   Tobacco  Allergies  Meds  Problems  Med Hx  Surg Hx  Fam Hx      Review of Systems:  No other skin or systemic complaints except as noted in HPI or Assessment and Plan.  Objective  Well appearing patient in no apparent distress; mood and affect are within normal limits.  All skin waist up examined.  left forehead (2) Erythematous thin papules/macules with gritty scale.   Back (20) Erythematous keratotic or waxy stuck-on papule or plaque.    Assessment & Plan   Lentigines - Scattered tan macules - Due to sun exposure - Benign-appering, observe - Recommend daily broad spectrum sunscreen SPF 30+ to sun-exposed areas, reapply every 2 hours as needed. - Call for any changes  Seborrheic Keratoses - Stuck-on, waxy, tan-brown papules and/or plaques  - Benign-appearing - Discussed benign etiology and prognosis. - Observe - Call for any changes  Melanocytic Nevi - Tan-brown and/or pink-flesh-colored symmetric macules and papules - Benign appearing on exam today - Observation - Call clinic for new or changing moles - Recommend daily use of broad spectrum spf 30+ sunscreen to sun-exposed areas.   Hemangiomas - Red papules - Discussed benign nature - Observe - Call for any changes  Actinic Damage - Chronic condition, secondary to cumulative UV/sun exposure - diffuse scaly erythematous macules with underlying dyspigmentation - Recommend daily broad spectrum sunscreen SPF 30+ to sun-exposed areas, reapply every 2 hours as needed.  - Staying in the shade or wearing long sleeves, sun glasses (UVA+UVB protection) and wide brim hats (4-inch brim around  the entire circumference of the hat) are also recommended for sun protection.  - Call for new or changing lesions.  Skin cancer screening performed today.  AK (actinic keratosis) (2) left forehead  Destruction of lesion - left forehead Complexity: simple   Destruction method: cryotherapy   Informed consent: discussed and consent obtained   Timeout:  patient name, date of birth, surgical site, and procedure verified Lesion destroyed using liquid nitrogen: Yes   Region frozen until ice ball extended beyond lesion: Yes   Outcome: patient tolerated procedure well with no complications   Post-procedure details: wound care instructions given    Inflamed seborrheic keratosis Back x 20  Destruction of lesion - Back Complexity: simple   Destruction method: cryotherapy   Informed consent: discussed and consent obtained   Timeout:  patient name, date of birth, surgical site, and procedure verified Lesion destroyed using liquid nitrogen: Yes   Region frozen until ice ball extended beyond lesion: Yes   Outcome: patient tolerated procedure well with no complications   Post-procedure details: wound care instructions given    Skin cancer screening  Return in about 1 year (around 05/02/2022).  I, Ashok Cordia, CMA, am acting as scribe for Sarina Ser, MD .  Documentation: I have reviewed the above documentation for accuracy and completeness, and I agree with the above.  Sarina Ser, MD

## 2021-05-02 NOTE — Patient Instructions (Signed)

## 2021-05-05 ENCOUNTER — Encounter: Payer: Self-pay | Admitting: Dermatology

## 2021-06-07 ENCOUNTER — Other Ambulatory Visit: Payer: Self-pay | Admitting: Family Medicine

## 2021-06-07 DIAGNOSIS — I10 Essential (primary) hypertension: Secondary | ICD-10-CM

## 2021-07-27 DIAGNOSIS — H401111 Primary open-angle glaucoma, right eye, mild stage: Secondary | ICD-10-CM | POA: Diagnosis not present

## 2021-07-27 DIAGNOSIS — H40052 Ocular hypertension, left eye: Secondary | ICD-10-CM | POA: Diagnosis not present

## 2021-08-12 ENCOUNTER — Other Ambulatory Visit: Payer: Self-pay | Admitting: Student in an Organized Health Care Education/Training Program

## 2021-08-12 DIAGNOSIS — M48062 Spinal stenosis, lumbar region with neurogenic claudication: Secondary | ICD-10-CM

## 2021-08-12 DIAGNOSIS — M5136 Other intervertebral disc degeneration, lumbar region: Secondary | ICD-10-CM

## 2021-08-12 DIAGNOSIS — G8929 Other chronic pain: Secondary | ICD-10-CM

## 2021-08-12 DIAGNOSIS — M5416 Radiculopathy, lumbar region: Secondary | ICD-10-CM

## 2021-08-12 DIAGNOSIS — G894 Chronic pain syndrome: Secondary | ICD-10-CM

## 2021-08-12 DIAGNOSIS — M47816 Spondylosis without myelopathy or radiculopathy, lumbar region: Secondary | ICD-10-CM

## 2021-09-08 NOTE — Progress Notes (Signed)
Name: Frank Hamilton   MRN: [id number]    DOB: October 27, 1941   Date:09/09/2021       Progress Note  Subjective  Chief Complaint  Follow up   HPI  HTN: taking medication and denies side effects, no chest pain or palpitation He is  on ARB, HCTZ and Norvasc . Saw Dr. Abigail Butts in May and losartan dose was increased from 50 to 100 mg but bp is still elevated - we will switch to Valsartan today goal is to keep SBP below 140. History of albuminuria, saw Dr. Abigail Butts   Skin lesions: seeing Dr. Nehemiah Massed  Dyslipidemia: Atorvastatin and lovaza  since triglycerides was  over 500, also taking  aspirin daily . He denies side effects of medication, lab work from January 2022 reviewed, not fasting and triglycerides was 248 , HDL was low at 28, LDL at goal at 63 . Discussed eating fish twice a week , tree nuts. We will recheck next visit    GERD:Triggered by greasy food he does not eat spicy food . 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 He saw Ortho in 2020 and was given reassurance. Aching pain, unable to make complete fist with left hand   Low back pain: he was seen by Dr. Holley Raring, he has MRI ( below ), he states since started on Lyrica at night pain has resolved, he has leg fatigue when walking, explained that is likely from PVD and not the radiculitis.  No bowel or bladder incontinence. He had PT in the past, but has not been doing home PT , he has been out of Lyrica but would like to get a refill to take qhs only since helps with claudications    IMPRESSION: 1. L3-4: Disc herniation more prominent towards the right with caudal migration down behind L4. Facet and ligamentous hypertrophy. Severe multifactorial stenosis at this level, particularly on the right. Neural compression seems certain at this level, particularly on the right affecting at least the L4 nerve. 2. L4-5:  Severe multifactorial spinal stenosis that could cause neural compression on either or both sides. Foraminal stenosis right worse than left. 3. L2-3: Mild multifactorial stenosis, most prominent in the left lateral recess. Left foraminal stenosis that could affect the left L2 nerve. 4. L5-S1: Left foraminal stenosis because of facet osteophytes and bulging disc material could affect the exiting L5 nerve.    PVD with claudication: mildly abnormal ABI he had PT ,  he can walk down his driveway - 2/3 mile without having to stop. He takes statin therapy    History of left kidney cyst : that went down in size from 2014 to 2015 and per studies stable, no hematuria or flank pain. Unchanged   AR: seasonal , taking Loratadine daily, still has nasal congestion but doing okay with medication. No rhinorrhea.   Mild aortic stenosis: had echo done in 2018, denies chest pain, palpitation, dizziness, and would like to hold off on repeating Echo. Unchanged   Skin excoriation: he has a dog and has intermittent excoriation on his hands, today a small area on right hand and needs Tdap today   Senile purpura: intermittent on arms  Patient Active Problem List   Diagnosis Date Noted   Spinal stenosis, lumbar region, with neurogenic claudication 02/10/2021   Chronic radicular lumbar pain 02/10/2021   Lumbar spondylosis 02/10/2021   Lumbar  degenerative disc disease 02/10/2021   Chronic pain syndrome 02/10/2021   Positive for macroalbuminuria 08/06/2018   PVD (peripheral vascular disease) with claudication ([place]) 07/02/2018   Senile purpura ([place]) 07/02/2018   Claudication ([place]) 04/23/2018   Vitamin D deficiency 05/02/2016   Osteoarthritis of both hands 05/01/2016   Hypertension, benign 08/03/2015   Hyperlipidemia 08/03/2015   Kidney cysts 12/15/2013    Past Surgical History:  Procedure Laterality Date   bowel obstruction  2011   CATARACT EXTRACTION, BILATERAL  2017   HERNIA REPAIR  2009 and 2010    TONSILLECTOMY     age 50 or 48    Family History  Problem Relation Age of Onset   Heart disease Mother    Stroke Father    Heart disease Sister    Diabetes Sister    Diabetes Maternal Grandfather     Social History   Tobacco Use   Smoking status: Former    Packs/day: 1.00    Years: 20.00    Pack years: 20.00    Types: Cigarettes    Quit date: 06/08/1974    Years since quitting: 47.2   Smokeless tobacco: Never  Substance Use Topics   Alcohol use: Yes    Alcohol/week: 1.0 standard drink    Types: 1 Cans of beer per week     Current Outpatient Medications:    amLODipine (NORVASC) 10 MG tablet, Take 1 tablet (10 mg total) by mouth daily., Disp: 90 tablet, Rfl: 1   aspirin 81 MG chewable tablet, Chew 81 mg by mouth., Disp: , Rfl:    atorvastatin (LIPITOR) 40 MG tablet, Take 1 tablet (40 mg total) by mouth daily., Disp: 90 tablet, Rfl: 1   Cholecalciferol (VITAMIN D) 2000 units CAPS, Take 1 capsule (2,000 Units total) by mouth daily., Disp: 30 capsule, Rfl:    glucosamine-chondroitin 500-400 MG tablet, Take 1 tablet by mouth in the morning and at bedtime. , Disp: , Rfl:    hydrochlorothiazide (HYDRODIURIL) 12.5 MG tablet, Take 1 tablet (12.5 mg total) by mouth daily., Disp: 90 tablet, Rfl: 1   ibuprofen (ADVIL) 200 MG tablet, Take by mouth., Disp: , Rfl:    latanoprost (XALATAN) 0.005 % ophthalmic solution, SMARTSIG:1 Drop(s) In Eye(s) Every Evening, Disp: , Rfl:    losartan (COZAAR) 100 MG tablet, Take 100 mg by mouth every morning., Disp: , Rfl:    losartan (COZAAR) 50 MG tablet, Take 1 tablet (50 mg total) by mouth daily., Disp: 90 tablet, Rfl: 1   meloxicam (MOBIC) 15 MG tablet, Take 1 tablet (15 mg total) by mouth daily., Disp: 30 tablet, Rfl: 0   omega-3 acid ethyl esters (LOVAZA) 1 g capsule, Take 2 capsules (2 g total) by mouth 2 (two) times daily., Disp: 360 capsule, Rfl: 1   pregabalin (LYRICA) 50 MG capsule, Take 1 capsule (50 mg total) by mouth 2 (two) times daily.,  Disp: 60 capsule, Rfl: 2  Allergies  Allergen Reactions   Gabapentin Rash    Headache also reported   Sulfa Antibiotics Itching and Rash    I personally reviewed active problem list, medication list, allergies, family history, social history, health maintenance with the patient/caregiver today.   ROS  Constitutional: Negative for fever or weight change.  Respiratory: Negative for cough and shortness of breath.   Cardiovascular: Negative for chest pain or palpitations.  Gastrointestinal: Negative for abdominal pain, no bowel changes.  Musculoskeletal: Negative for gait problem or joint swelling.  Skin: Negative for rash.  Neurological: Negative for  dizziness or headache.  No other specific complaints in a complete review of systems (except as listed in HPI above).   Objective  Vitals:   09/09/21 1055  BP: (!) 148/62  Pulse: 63  Resp: 18  Temp: 98.1 F (36.7 C)  TempSrc: Oral  SpO2: 98%  Weight: 204 lb 11.2 oz (92.9 kg)  Height: 5\' 11"  (1.803 m)    Body mass index is 28.55 kg/m.  Physical Exam  Constitutional: Patient appears well-developed and well-nourished. Overweight.  No distress.  HEENT: head atraumatic, normocephalic, pupils equal and reactive to light, ears scarring on both ear drums, , neck supple, throat within normal limits Cardiovascular: Normal rate, regular rhythm and normal heart sounds.  No murmur heard. No BLE edema. Pulmonary/Chest: Effort normal and breath sounds normal. No respiratory distress. Abdominal: Soft.  There is no tenderness. Psychiatric: Patient has a normal mood and affect. behavior is normal. Judgment and thought content normal.   PHQ2/9: Depression screen [facility] 2/9 09/09/2021 03/09/2021 02/10/2021 01/11/2021 11/15/2020  Decreased Interest 0 0 0 1 0  Down, Depressed, Hopeless 0 0 0 0 0  PHQ - 2 Score 0 0 0 1 0  Altered sleeping - - - 0 -  Tired, decreased energy - - - 0 -  Change in appetite - - - 0 -  Feeling bad or failure about  yourself  - - - 0 -  Trouble concentrating - - - 0 -  Moving slowly or fidgety/restless - - - 0 -  Suicidal thoughts - - - 0 -  PHQ-9 Score - - - 1 -  Difficult doing work/chores - - - Not difficult at all -  Some recent data might be hidden    phq 9 is negative   Fall Risk: Fall Risk  09/09/2021 03/09/2021 02/10/2021 01/11/2021 11/15/2020  Falls in the past year? 0 1 0 1 1  Number falls in past yr: - 0 0 0 0  Injury with Fall? - 0 0 0 0  Risk for fall due to : - - - History of fall(s) History of fall(s)  Follow up Falls prevention discussed - - Falls prevention discussed -     Functional Status Survey: Is the patient deaf or have difficulty hearing?: Yes Does the patient have difficulty seeing, even when wearing glasses/contacts?: No Does the patient have difficulty concentrating, remembering, or making decisions?: No Does the patient have difficulty walking or climbing stairs?: Yes Does the patient have difficulty dressing or bathing?: No Does the patient have difficulty doing errands alone such as visiting a doctor's office or shopping?: No    Assessment & Plan  1. Senile purpura ([facility])  Stable   2. Need for Tdap vaccination  - Tdap vaccine greater than or equal to 7yo IM  3. Essential hypertension  - amLODipine (NORVASC) 10 MG tablet; Take 1 tablet (10 mg total) by mouth daily.  Dispense: 90 tablet; Refill: 1  4. Need for immunization against influenza  - Flu vaccine HIGH DOSE PF (Fluzone High dose)  5. PVD (peripheral vascular disease) with claudication ([facility])  On statin therapy, walking and pain has decreased   6. Dyslipidemia  - atorvastatin (LIPITOR) 40 MG tablet; Take 1 tablet (40 mg total) by mouth daily.  Dispense: 90 tablet; Refill: 1 - omega-3 acid ethyl esters (LOVAZA) 1 g capsule; Take 2 capsules (2 g total) by mouth 2 (two) times daily.  Dispense: 360 capsule; Refill: 1  7. Low back pain with radiation   8.  Primary osteoarthritis involving multiple  joints   9. Vitamin D deficiency   10. Skin excoriation   11. Chronic radicular lumbar pain ([place])  - pregabalin (LYRICA) 50 MG capsule; Take 1 capsule (50 mg total) by mouth at bedtime.  Dispense: 90 capsule; Refill: 1  12. Positive for macroalbuminuria   13. Bilateral hearing loss, unspecified hearing loss type  - Ambulatory referral to ENT

## 2021-09-09 ENCOUNTER — Ambulatory Visit (INDEPENDENT_AMBULATORY_CARE_PROVIDER_SITE_OTHER): Payer: Medicare Other | Admitting: Family Medicine

## 2021-09-09 ENCOUNTER — Encounter: Payer: Self-pay | Admitting: Family Medicine

## 2021-09-09 ENCOUNTER — Other Ambulatory Visit: Payer: Self-pay

## 2021-09-09 VITALS — BP 148/68 | HR 63 | Temp 98.1°F | Resp 18 | Ht 71.0 in | Wt 204.7 lb

## 2021-09-09 DIAGNOSIS — E785 Hyperlipidemia, unspecified: Secondary | ICD-10-CM

## 2021-09-09 DIAGNOSIS — E559 Vitamin D deficiency, unspecified: Secondary | ICD-10-CM

## 2021-09-09 DIAGNOSIS — T148XXA Other injury of unspecified body region, initial encounter: Secondary | ICD-10-CM | POA: Diagnosis not present

## 2021-09-09 DIAGNOSIS — R809 Proteinuria, unspecified: Secondary | ICD-10-CM

## 2021-09-09 DIAGNOSIS — H9193 Unspecified hearing loss, bilateral: Secondary | ICD-10-CM

## 2021-09-09 DIAGNOSIS — M5416 Radiculopathy, lumbar region: Secondary | ICD-10-CM

## 2021-09-09 DIAGNOSIS — D692 Other nonthrombocytopenic purpura: Secondary | ICD-10-CM

## 2021-09-09 DIAGNOSIS — I10 Essential (primary) hypertension: Secondary | ICD-10-CM | POA: Diagnosis not present

## 2021-09-09 DIAGNOSIS — Z23 Encounter for immunization: Secondary | ICD-10-CM

## 2021-09-09 DIAGNOSIS — G8929 Other chronic pain: Secondary | ICD-10-CM

## 2021-09-09 DIAGNOSIS — I739 Peripheral vascular disease, unspecified: Secondary | ICD-10-CM

## 2021-09-09 DIAGNOSIS — M159 Polyosteoarthritis, unspecified: Secondary | ICD-10-CM

## 2021-09-09 DIAGNOSIS — M545 Low back pain, unspecified: Secondary | ICD-10-CM

## 2021-09-09 MED ORDER — PREGABALIN 50 MG PO CAPS: 50.0000 mg | ORAL_CAPSULE | Freq: Every day | ORAL | 1 refills | Status: DC

## 2021-09-09 MED ORDER — OMEGA-3-ACID ETHYL ESTERS 1 G PO CAPS: 2.0000 | ORAL_CAPSULE | Freq: Two times a day (BID) | ORAL | 1 refills | Status: DC

## 2021-09-09 MED ORDER — VALSARTAN 160 MG PO TABS: 160.0000 mg | ORAL_TABLET | Freq: Every day | ORAL | 0 refills | Status: DC

## 2021-09-09 MED ORDER — AMLODIPINE BESYLATE 10 MG PO TABS: 10.0000 mg | ORAL_TABLET | Freq: Every day | ORAL | 1 refills | Status: DC

## 2021-09-09 MED ORDER — ATORVASTATIN CALCIUM 40 MG PO TABS: 40.0000 mg | ORAL_TABLET | Freq: Every day | ORAL | 1 refills | Status: DC

## 2021-09-14 DIAGNOSIS — I10 Essential (primary) hypertension: Secondary | ICD-10-CM | POA: Diagnosis not present

## 2021-09-14 DIAGNOSIS — R809 Proteinuria, unspecified: Secondary | ICD-10-CM | POA: Diagnosis not present

## 2021-11-21 ENCOUNTER — Encounter: Payer: Self-pay | Admitting: Family Medicine

## 2021-11-21 ENCOUNTER — Other Ambulatory Visit: Payer: Self-pay

## 2021-11-21 ENCOUNTER — Ambulatory Visit: Payer: Self-pay | Admitting: *Deleted

## 2021-11-21 ENCOUNTER — Ambulatory Visit (INDEPENDENT_AMBULATORY_CARE_PROVIDER_SITE_OTHER): Payer: Medicare Other | Admitting: Family Medicine

## 2021-11-21 VITALS — BP 136/74 | HR 70 | Temp 98.6°F | Resp 16 | Ht 71.0 in | Wt 204.0 lb

## 2021-11-21 DIAGNOSIS — R52 Pain, unspecified: Secondary | ICD-10-CM | POA: Diagnosis not present

## 2021-11-21 DIAGNOSIS — R6883 Chills (without fever): Secondary | ICD-10-CM | POA: Diagnosis not present

## 2021-11-21 DIAGNOSIS — B349 Viral infection, unspecified: Secondary | ICD-10-CM

## 2021-11-21 LAB — POCT INFLUENZA A/B
Influenza A, POC: NEGATIVE
Influenza B, POC: NEGATIVE

## 2021-11-21 MED ORDER — BENZONATATE 100 MG PO CAPS: 100.0000 mg | ORAL_CAPSULE | Freq: Three times a day (TID) | ORAL | 0 refills | Status: DC | PRN

## 2021-11-21 NOTE — Telephone Encounter (Signed)
Appt made

## 2021-11-21 NOTE — Telephone Encounter (Signed)
Summary: advice - possible Covid   Pt stated he thinks he might have COVID, stated he has a headache and body aches / chills, pt had some follow up questions on what to do, please advise.        Chief Complaint: covid sx. Has not tested. Wants appt. Symptoms: headache , body aches, chills, scratchy throat , occasional cough  Frequency: last night  difficulty breathing, no fever Pertinent Negatives: Patient denies chest pain Disposition: [] ED /[x] Urgent Care (no appt availability in office) / [] Appointment(In office/virtual)/ []  [facility]/ [] Home Care/ [] Refused Recommended Disposition /[] [facility]/ []  Follow-up with PCP Additional Notes:  Please advise if patient can get tested for covid. Requesting appt. Unable to do VV only phone visit due to no My Chart. No available appt with PCP until 12/20/21. Please advise         Reason for Disposition  [1] HIGH RISK for severe COVID complications (e.g., weak immune system, age > 70 years, obesity with BMI > 25, pregnant, chronic lung disease or other chronic medical condition) AND [2] COVID symptoms (e.g., cough, fever)  (Exceptions: Already seen by PCP and no new or worsening symptoms.)  Answer Assessment - Initial Assessment Questions 1. COVID-19 DIAGNOSIS: "Who made your COVID-19 diagnosis?" "Was it confirmed by a positive lab test or self-test?" If not diagnosed by a doctor (or NP/PA), ask "Are there lots of cases (community spread) where you live?" Note: See public health department website, if unsure.     Has not tested no at home test 2. COVID-19 EXPOSURE: "Was there any known exposure to COVID before the symptoms began?" CDC Definition of close contact: within 6 feet (2 meters) for a total of 15 minutes or more over a 24-hour period.      na 3. ONSET: "When did the COVID-19 symptoms start?"      Headache, chills, body aches , scratchy throat  4. WORST SYMPTOM: "What is your worst symptom?" (e.g., cough, fever,  shortness of breath, muscle aches)     Body aches  5. COUGH: "Do you have a cough?" If Yes, ask: "How bad is the cough?"       Occasional  6. FEVER: "Do you have a fever?" If Yes, ask: "What is your temperature, how was it measured, and when did it start?"     no 7. RESPIRATORY STATUS: "Describe your breathing?" (e.g., shortness of breath, wheezing, unable to speak)      none 8. BETTER-SAME-WORSE: "Are you getting better, staying the same or getting worse compared to yesterday?"  If getting worse, ask, "In what way?"     na 9. HIGH RISK DISEASE: "Do you have any chronic medical problems?" (e.g., asthma, heart or lung disease, weak immune system, obesity, etc.)     Hx murmur  10. VACCINE: "Have you had the COVID-19 vaccine?" If Yes, ask: "Which one, how many shots, when did you get it?"        [employer]  11. BOOSTER: "Have you received your COVID-19 booster?" If Yes, ask: "Which one and when did you get it?"       [employer]  12. PREGNANCY: "Is there any chance you are pregnant?" "When was your last menstrual period?"       na 13. OTHER SYMPTOMS: "Do you have any other symptoms?"  (e.g., chills, fatigue, headache, loss of smell or taste, muscle pain, sore throat)       Headache, body aches, chills, sore throat  14.  O2 SATURATION MONITOR:  "Do you use an oxygen saturation monitor (pulse oximeter) at home?" If Yes, ask "What is your reading (oxygen level) today?" "What is your usual oxygen saturation reading?" (e.g., 95%)       na  Protocols used: Coronavirus (COVID-19) Diagnosed or Suspected-A-AH

## 2021-11-21 NOTE — Progress Notes (Signed)
Name: Frank Hamilton   MRN: [id number]    DOB: [birth year]   Date:11/21/2021       Progress Note  Subjective  Chief Complaint  Chief Complaint  Patient presents with   Headache   Generalized Body Aches   Chills    HPI  Viral Illness: patient states developed chills last night, followed by feeling hot, sore throat, headache, body ache, fatigue , decrease in appetite . Rapid flu test negative. He had 3 COVID vaccines but not the last booster. No known exposure to COVID-19. We will send PCR test , explained he is a good candidate for Paxlovid and if positive test he is willing to take medication . Advised him to get a pulse oximeter and go to [facility] if worsening of symptoms and pulse ox goes below 90 %, may take vitamin C and zinc otc. Stay hydrated and try to eat a little , may take Tylenol prn for pain   Patient Active Problem List   Diagnosis Date Noted   Spinal stenosis, lumbar region, with neurogenic claudication 02/10/2021   Chronic radicular lumbar pain 02/10/2021   Lumbar spondylosis 02/10/2021   Lumbar degenerative disc disease 02/10/2021   Chronic pain syndrome 02/10/2021   Positive for macroalbuminuria 08/06/2018   PVD (peripheral vascular disease) with claudication ([place]) 07/02/2018   Senile purpura ([place]) 07/02/2018   Claudication ([place]) 04/23/2018   Vitamin D deficiency 05/02/2016   Osteoarthritis of both hands 05/01/2016   Hypertension, benign 08/03/2015   Hyperlipidemia 08/03/2015   Kidney cysts 12/15/2013    Social History   Tobacco Use   Smoking status: Former    Packs/day: 1.00    Years: 20.00    Pack years: 20.00    Types: Cigarettes    Quit date: 06/08/1974    Years since quitting: 47.4   Smokeless tobacco: Never  Substance Use Topics   Alcohol use: Yes    Alcohol/week: 1.0 standard drink    Types: 1 Cans of beer per week     Current Outpatient Medications:    amLODipine (NORVASC) 10 MG tablet, Take 1 tablet (10 mg total) by mouth daily., Disp: 90 tablet,  Rfl: 1   aspirin 81 MG chewable tablet, Chew 81 mg by mouth., Disp: , Rfl:    atorvastatin (LIPITOR) 40 MG tablet, Take 1 tablet (40 mg total) by mouth daily., Disp: 90 tablet, Rfl: 1   benzonatate (TESSALON) 100 MG capsule, Take 1 capsule (100 mg total) by mouth 3 (three) times daily as needed for cough., Disp: 40 capsule, Rfl: 0   Cholecalciferol (VITAMIN D) 2000 units CAPS, Take 1 capsule (2,000 Units total) by mouth daily., Disp: 30 capsule, Rfl:    glucosamine-chondroitin 500-400 MG tablet, Take 1 tablet by mouth in the morning and at bedtime. , Disp: , Rfl:    hydrochlorothiazide (HYDRODIURIL) 12.5 MG tablet, Take 1 tablet (12.5 mg total) by mouth daily., Disp: 90 tablet, Rfl: 1   latanoprost (XALATAN) 0.005 % ophthalmic solution, SMARTSIG:1 Drop(s) In Eye(s) Every Evening, Disp: , Rfl:    omega-3 acid ethyl esters (LOVAZA) 1 g capsule, Take 2 capsules (2 g total) by mouth 2 (two) times daily., Disp: 360 capsule, Rfl: 1   pregabalin (LYRICA) 50 MG capsule, Take 1 capsule (50 mg total) by mouth at bedtime., Disp: 90 capsule, Rfl: 1   valsartan (DIOVAN) 160 MG tablet, Take 1 tablet (160 mg total) by mouth daily. In place of Valsartan for bp, Disp: 90 tablet, Rfl: 0  Allergies  Allergen  Reactions   Gabapentin Rash    Headache also reported   Sulfa Antibiotics Itching and Rash    ROS  Ten systems reviewed and is negative except as mentioned in HPI   Objective  Vitals:   11/21/21 1420  BP: 136/74  Pulse: 70  Resp: 16  Temp: 98.6 F (37 C)  SpO2: 95%  Weight: 204 lb (92.5 kg)  Height: 5\' 11"  (1.803 m)    Body mass index is 28.45 kg/m.   Physical Exam  Constitutional: Patient appears well-developed and well-nourished. Obese  No distress.  HEENT: head atraumatic, normocephalic, pupils equal and reactive to light, ears scaring right ear drum, neck supple Cardiovascular: Normal rate, regular rhythm and normal heart sounds.  No murmur heard. No BLE edema. Pulmonary/Chest:  Effort normal and breath sounds normal. No respiratory distress. Abdominal: Soft.  There is no tenderness. Psychiatric: Patient has a normal mood and affect. behavior is normal. Judgment and thought content normal  Recent Results (from the past 2160 hour(s))  POCT Influenza A/B     Status: None   Collection Time: 11/21/21  2:34 PM  Result Value Ref Range   Influenza A, POC Negative Negative   Influenza B, POC Negative Negative     Assessment & Plan   1. Body aches  - POCT Influenza A/B - Novel Coronavirus, NAA ([facility])  2. Chills  - POCT Influenza A/B - Novel Coronavirus, NAA ([facility])  3. Viral illness  - benzonatate (TESSALON) 100 MG capsule; Take 1 capsule (100 mg total) by mouth 3 (three) times daily as needed for cough.  Dispense: 40 capsule; Refill: 0

## 2021-11-21 NOTE — Patient Instructions (Signed)
Get a pulse ox and if it goes below 90 % go to [facility] Drink plenty of fluids May take zinc, vitamin C and vitamin D otc  Tylenol prn for body aches We will send rx for cough medication

## 2021-11-23 LAB — SPECIMEN STATUS REPORT

## 2021-11-23 LAB — NOVEL CORONAVIRUS, NAA: SARS-CoV-2, NAA: DETECTED — AB

## 2021-11-23 LAB — SARS-COV-2, NAA 2 DAY TAT

## 2021-11-24 ENCOUNTER — Other Ambulatory Visit: Payer: Self-pay | Admitting: Family Medicine

## 2021-11-24 DIAGNOSIS — U071 COVID-19: Secondary | ICD-10-CM

## 2021-11-24 MED ORDER — NIRMATRELVIR/RITONAVIR (PAXLOVID) TABLET (RENAL DOSING): 2.0000 | ORAL_TABLET | Freq: Two times a day (BID) | ORAL | 0 refills | Status: AC

## 2021-12-09 ENCOUNTER — Other Ambulatory Visit: Payer: Self-pay

## 2021-12-09 ENCOUNTER — Ambulatory Visit: Payer: Medicare Other

## 2021-12-09 VITALS — BP 140/62

## 2021-12-09 DIAGNOSIS — I10 Essential (primary) hypertension: Secondary | ICD-10-CM

## 2021-12-09 NOTE — Progress Notes (Signed)
Patient here for Blood pressure check since changing Blood pressure to Valsartan also currently on Norvasc and HCTZ.  Patient denies any side effects.  Blood pressure today is 140/62

## 2021-12-14 ENCOUNTER — Other Ambulatory Visit: Payer: Self-pay | Admitting: Family Medicine

## 2021-12-14 DIAGNOSIS — I10 Essential (primary) hypertension: Secondary | ICD-10-CM

## 2021-12-14 MED ORDER — VALSARTAN-HYDROCHLOROTHIAZIDE 160-12.5 MG PO TABS: 1.0000 | ORAL_TABLET | Freq: Every day | ORAL | 1 refills | Status: DC

## 2021-12-23 DIAGNOSIS — H9313 Tinnitus, bilateral: Secondary | ICD-10-CM | POA: Diagnosis not present

## 2021-12-23 DIAGNOSIS — H903 Sensorineural hearing loss, bilateral: Secondary | ICD-10-CM | POA: Diagnosis not present

## 2022-01-17 ENCOUNTER — Ambulatory Visit (INDEPENDENT_AMBULATORY_CARE_PROVIDER_SITE_OTHER): Payer: Medicare Other

## 2022-01-17 ENCOUNTER — Other Ambulatory Visit: Payer: Self-pay

## 2022-01-17 VITALS — BP 160/70 | HR 57 | Temp 98.3°F | Resp 15 | Ht 71.0 in | Wt 206.2 lb

## 2022-01-17 DIAGNOSIS — Z0000 Encounter for general adult medical examination without abnormal findings: Secondary | ICD-10-CM | POA: Diagnosis not present

## 2022-01-17 DIAGNOSIS — Z23 Encounter for immunization: Secondary | ICD-10-CM | POA: Diagnosis not present

## 2022-01-17 NOTE — Progress Notes (Signed)
Subjective:   Frank Hamilton is a 81 y.o. male who presents for Medicare Annual/Subsequent preventive examination.  Review of Systems     Cardiac Risk Factors include: advanced age (>52mn, >>67women);male gender;dyslipidemia;hypertension;sedentary lifestyle     Objective:    Today's Vitals   01/17/22 1102 01/17/22 1107  BP: (!) 160/70   Pulse: (!) 57   Resp: 15   Temp: 98.3 F (36.8 C)   TempSrc: Oral   SpO2: 95%   Weight: 206 lb 3.2 oz (93.5 kg)   Height: '5\' 11"'$  (1.803 m)   PainSc:  1    Body mass index is 28.76 kg/m.  Advanced Directives 01/17/2022 02/10/2021 01/11/2021 01/09/2020 01/02/2019 04/17/2018 06/27/2017  Does Patient Have a Medical Advance Directive? No No No No No No No  Would patient like information on creating a medical advance directive? No - Patient declined No - Patient declined No - Patient declined Yes (MAU/Ambulatory/Procedural Areas - Information given) No - Patient declined No - Patient declined -    Current Medications (verified) Outpatient Encounter Medications as of 01/17/2022  Medication Sig   amLODipine (NORVASC) 10 MG tablet Take 1 tablet (10 mg total) by mouth daily.   aspirin 81 MG chewable tablet Chew 81 mg by mouth.   atorvastatin (LIPITOR) 40 MG tablet Take 1 tablet (40 mg total) by mouth daily.   Cholecalciferol (VITAMIN D) 2000 units CAPS Take 1 capsule (2,000 Units total) by mouth daily.   glucosamine-chondroitin 500-400 MG tablet Take 1 tablet by mouth in the morning and at bedtime.    latanoprost (XALATAN) 0.005 % ophthalmic solution SMARTSIG:1 Drop(s) In Eye(s) Every Evening   omega-3 acid ethyl esters (LOVAZA) 1 g capsule Take 2 capsules (2 g total) by mouth 2 (two) times daily.   valsartan-hydrochlorothiazide (DIOVAN-HCT) 160-12.5 MG tablet Take 1 tablet by mouth daily.   pregabalin (LYRICA) 50 MG capsule Take 1 capsule (50 mg total) by mouth at bedtime.   [DISCONTINUED] benzonatate (TESSALON) 100 MG capsule Take 1 capsule (100 mg  total) by mouth 3 (three) times daily as needed for cough.   No facility-administered encounter medications on file as of 01/17/2022.    Allergies (verified) Gabapentin and Sulfa antibiotics   History: Past Medical History:  Diagnosis Date   Hyperlipidemia    Hypertension    Kidney cysts    Osteoarthritis    [place]    Vitamin D deficiency    Past Surgical History:  Procedure Laterality Date   bowel obstruction  2011   CATARACT EXTRACTION, BILATERAL  2017   HERNIA REPAIR  2009 and 2010   TONSILLECTOMY     age [id number]  Family History  Problem Relation Age of Onset   Heart disease Mother    Stroke Father    Heart disease Sister    Diabetes Sister    Diabetes Maternal Grandfather    Social History   Socioeconomic History   Marital status: Widowed    Spouse name: Not on file   Number of children: 3   Years of education: Not on file   Highest education level: High school graduate  Occupational History   Occupation: retired  Tobacco Use   Smoking status: Former    Packs/day: 1.00    Years: 20.00    Pack years: 20.00    Types: Cigarettes    Quit date: 06/08/1974    Years since quitting: 47.6   Smokeless tobacco: Never  Vaping Use  Vaping Use: Never used  Substance and Sexual Activity   Alcohol use: Yes    Alcohol/week: 1.0 standard drink    Types: 1 Cans of beer per week   Drug use: No   Sexual activity: Not Currently  Other Topics Concern   Not on file  Social History Narrative   He was married for 61 years , wife died in 02-23-16. Lives with his dog   Social Determinants of Health   Financial Resource Strain: Low Risk    Difficulty of Paying Living Expenses: Not hard at all  Food Insecurity: No Food Insecurity   Worried About Charity fundraiser in the Last Year: Never true   Arboriculturist in the Last Year: Never true  Transportation Needs: No Transportation Needs   Lack of Transportation (Medical): No   Lack of  Transportation (Non-Medical): No  Physical Activity: Insufficiently Active   Days of Exercise per Week: 7 days   Minutes of Exercise per Session: 10 min  Stress: No Stress Concern Present   Feeling of Stress : Not at all  Social Connections: Socially Isolated   Frequency of Communication with Friends and Family: More than three times a week   Frequency of Social Gatherings with Friends and Family: Twice a week   Attends Religious Services: Never   Marine scientist or Organizations: No   Attends Archivist Meetings: Never   Marital Status: Widowed    Tobacco Counseling Counseling given: Not Answered   Clinical Intake:  Pre-visit preparation completed: Yes  Pain : 0-10 Pain Score: 1  Pain Type: Acute pain Pain Location: Teeth Pain Descriptors / Indicators: Aching, Sore Pain Onset: 1 to 4 weeks ago Pain Frequency: Constant     BMI - recorded: 28.76 Nutritional Status: BMI 25 -29 Overweight Nutritional Risks: None Diabetes: No  How often do you need to have someone help you when you read instructions, pamphlets, or other written materials from your doctor or pharmacy?: 1 - Never    Interpreter Needed?: No  Information entered by :: Clemetine Marker LPN   Activities of Daily Living In your present state of health, do you have any difficulty performing the following activities: 01/17/2022 11/21/2021  Hearing? Tempie Donning  Vision? N N  Difficulty concentrating or making decisions? N N  Walking or climbing stairs? Y Y  Dressing or bathing? N N  Doing errands, shopping? N N  Preparing Food and eating ? N -  Using the Toilet? N -  In the past six months, have you accidently leaked urine? Y -  Do you have problems with loss of bowel control? N -  Managing your Medications? N -  Managing your Finances? N -  Housekeeping or managing your Housekeeping? N -  Some recent data might be hidden    Patient Care Team: Steele Sizer, MD as PCP - General (Family  Medicine) Lavonia Dana, MD as Consulting Physician (Nephrology) Ralene Bathe, MD (Dermatology)  Indicate any recent Medical Services you may have received from other than [facility] providers in the past year (date may be approximate).     Assessment:   This is a routine wellness examination for Frank Hamilton.  Hearing/Vision screen Hearing Screening - Comments::  Pt c/o poor hearing; plans to get hearing aids at [place]:: Annual vision screenings at [facility] in [place] issues and exercise activities discussed: Current Exercise Habits: Home exercise routine, Type of exercise: walking;stretching, Time (Minutes): 10, Frequency (  Times/Week): 7, Weekly Exercise (Minutes/Week): 70, Intensity: Mild, Exercise limited by: neurologic condition(s)   Goals Addressed             This Visit's Progress    Patient Stated   On track    Patient states he would like to remain healthy and continue to improve gait, balance and strength in legs.     Weight (lb) < 200 lb (90.7 kg)   206 lb 3.2 oz (93.5 kg)    Pt states he would like to lose 10-15 pounds over the next year with healthy eating and physical activity        Depression Screen PHQ 2/9 Scores 01/17/2022 11/21/2021 09/09/2021 03/09/2021 02/10/2021 01/11/2021 11/15/2020  PHQ - 2 Score 0 0 0 0 0 1 0  PHQ- 9 Score 2 0 - - - 1 -    Fall Risk Fall Risk  01/17/2022 11/21/2021 09/09/2021 03/09/2021 02/10/2021  Falls in the past year? 0 0 0 1 0  Number falls in past yr: 0 0 - 0 0  Injury with Fall? 0 0 - 0 0  Risk for fall due to : No Fall Risks No Fall Risks - - -  Follow up Falls prevention discussed Falls prevention discussed Falls prevention discussed - -    FALL RISK PREVENTION PERTAINING TO THE HOME:  Any stairs in or around the home? Yes  If so, are there any without handrails? No  Home free of loose throw rugs in walkways, pet beds, electrical cords, etc? Yes  Adequate lighting in your home to reduce risk of  falls? Yes   ASSISTIVE DEVICES UTILIZED TO PREVENT FALLS:  Life alert? No  Use of a cane, walker or w/c? No - may use walking stick when outside Grab bars in the bathroom? No  Shower chair or bench in shower? Yes  Elevated toilet seat or a handicapped toilet? Yes   TIMED UP AND GO:  Was the test performed? Yes .  Length of time to ambulate 10 feet: 7 sec.   Gait slow and steady without use of assistive device  Cognitive Function: Normal cognitive status assessed by direct observation by this Nurse Health Advisor. No abnormalities found.        6CIT Screen 01/09/2020 01/02/2019  What Year? 0 points 0 points  What month? 0 points 0 points  What time? 0 points 0 points  Count back from 20 0 points 0 points  Months in reverse 0 points 0 points  Repeat phrase 0 points 0 points  Total Score 0 0    Immunizations Immunization History  Administered Date(s) Administered   Fluad Quad(high Dose 65+) 09/08/2019   Influenza, High Dose Seasonal PF 11/15/2016, 09/27/2017, 10/04/2018, 11/15/2020, 09/09/2021   Influenza,inj,Quad PF,6+ Mos 08/03/2015   Influenza-Unspecified 09/13/2013   PFIZER(Purple Top)SARS-COV-2 Vaccination 12/18/2019, 01/08/2020   PNEUMOCOCCAL CONJUGATE-20 01/17/2022   Pneumococcal Conjugate-13 04/01/2018   Tdap 08/15/2011, 09/09/2021    TDAP status: Up to date  Flu Vaccine status: Up to date  Pneumococcal vaccine status: Up to date  Covid-19 vaccine status: Completed vaccines  Qualifies for Shingles Vaccine? Yes   Zostavax completed No   Shingrix Completed?: No.    Education has been provided regarding the importance of this vaccine. Patient has been advised to call insurance company to determine out of pocket expense if they have not yet received this vaccine. Advised may also receive vaccine at local pharmacy or Health Dept. Verbalized acceptance and understanding.  Screening Tests Health Maintenance  Topic Date Due   Zoster Vaccines- Shingrix (1 of 2)  Never done   COVID-19 Vaccine (3 - Pfizer risk series) 02/05/2020   TETANUS/TDAP  09/10/2031   Pneumonia Vaccine 73+ Years old  Completed   INFLUENZA VACCINE  Completed   Hepatitis C Screening  Completed   HPV VACCINES  Aged Out    Health Maintenance  Health Maintenance Due  Topic Date Due   Zoster Vaccines- Shingrix (1 of 2) Never done   COVID-19 Vaccine (3 - Pfizer risk series) 02/05/2020    Colorectal cancer screening: No longer required.   Lung Cancer Screening: (Low Dose CT Chest recommended if Age 64-80 years, 30 pack-year currently smoking OR have quit w/in 15years.) does not qualify.   Additional Screening:  Hepatitis C Screening: does qualify; Completed 11/15/20  Vision Screening: Recommended annual ophthalmology exams for early detection of glaucoma and other disorders of the eye. Is the patient up to date with their annual eye exam?  Yes  Who is the provider or what is the name of the office in which the patient attends annual eye exams? [facility].   Dental Screening: Recommended annual dental exams for proper oral hygiene  Community Resource Referral / Chronic Care Management: CRR required this visit?  No   CCM required this visit?  No      Plan:     I have personally reviewed and noted the following in the patients chart:   Medical and social history Use of alcohol, tobacco or illicit drugs  Current medications and supplements including opioid prescriptions. Patient is not currently taking opioid prescriptions. Functional ability and status Nutritional status Physical activity Advanced directives List of other physicians Hospitalizations, surgeries, and ER visits in previous 12 months Vitals Screenings to include cognitive, depression, and falls Referrals and appointments  In addition, I have reviewed and discussed with patient certain preventive protocols, quality metrics, and best practice recommendations. A written personalized care plan for  preventive services as well as general preventive health recommendations were provided to patient.     Clemetine Marker, LPN   03/13/8840   Nurse Notes: pt's BP 160/70 x2. Per Dr. Ancil Boozer pt to return in 1 week for nurse visit for BP check. Pt denies headaches, blurred vision or chest pain. Pt to monitor at home daily.

## 2022-01-17 NOTE — Patient Instructions (Signed)
[name] , Thank you for taking time to come for your Medicare Wellness Visit. I appreciate your ongoing commitment to your health goals. Please review the following plan we discussed and let me know if I can assist you in the future.   Screening recommendations/referrals: Colonoscopy: no longer required Recommended yearly ophthalmology/optometry visit for glaucoma screening and checkup Recommended yearly dental visit for hygiene and checkup  Vaccinations: Influenza vaccine: done 09/09/21 Pneumococcal vaccine: done today Tdap vaccine: done 09/09/21 Shingles vaccine: Shingrix discussed. Please contact your pharmacy for coverage information.  Covid-19:  done 12/18/19 & 01/08/20; please bring a copy of your vaccine record to your next appt for booster information  Advanced directives: Please bring a copy of your health care power of attorney and living will to the office at your convenience once you have completed that paperwork.   Conditions/risks identified: Keep up the great work!  Next appointment: Follow up in one year for your annual wellness visit.   Preventive Care 1 Years and Older, Male Preventive care refers to lifestyle choices and visits with your health care provider that can promote health and wellness. What does preventive care include? A yearly physical exam. This is also called an annual well check. Dental exams once or twice a year. Routine eye exams. Ask your health care provider how often you should have your eyes checked. Personal lifestyle choices, including: Daily care of your teeth and gums. Regular physical activity. Eating a healthy diet. Avoiding tobacco and drug use. Limiting alcohol use. Practicing safe sex. Taking low doses of aspirin every day. Taking vitamin and mineral supplements as recommended by your health care provider. What happens during an annual well check? The services and screenings done by your health care provider during your annual  well check will depend on your age, overall health, lifestyle risk factors, and family history of disease. Counseling  Your health care provider may ask you questions about your: Alcohol use. Tobacco use. Drug use. Emotional well-being. Home and relationship well-being. Sexual activity. Eating habits. History of falls. Memory and ability to understand (cognition). Work and work Statistician. Screening  You may have the following tests or measurements: Height, weight, and BMI. Blood pressure. Lipid and cholesterol levels. These may be checked every 5 years, or more frequently if you are over 27 years old. Skin check. Lung cancer screening. You may have this screening every year starting at age 43 if you have a 30-pack-year history of smoking and currently smoke or have quit within the past 15 years. Fecal occult blood test (FOBT) of the stool. You may have this test every year starting at age 54. Flexible sigmoidoscopy or colonoscopy. You may have a sigmoidoscopy every 5 years or a colonoscopy every 10 years starting at age 1. Prostate cancer screening. Recommendations will vary depending on your family history and other risks. Hepatitis C blood test. Hepatitis B blood test. Sexually transmitted disease (STD) testing. Diabetes screening. This is done by checking your blood sugar (glucose) after you have not eaten for a while (fasting). You may have this done every 1-3 years. Abdominal aortic aneurysm (AAA) screening. You may need this if you are a current or former smoker. Osteoporosis. You may be screened starting at age 27 if you are at high risk. Talk with your health care provider about your test results, treatment options, and if necessary, the need for more tests. Vaccines  Your health care provider may recommend certain vaccines, such as: Influenza vaccine. This is recommended every year.  Tetanus, diphtheria, and acellular pertussis (Tdap, Td) vaccine. You may need a Td booster  every 10 years. Zoster vaccine. You may need this after age 76. Pneumococcal 13-valent conjugate (PCV13) vaccine. One dose is recommended after age 16. Pneumococcal polysaccharide (PPSV23) vaccine. One dose is recommended after age 26. Talk to your health care provider about which screenings and vaccines you need and how often you need them. This information is not intended to replace advice given to you by your health care provider. Make sure you discuss any questions you have with your health care provider. Document Released: 11/26/2015 Document Revised: 07/19/2016 Document Reviewed: 08/31/2015 Elsevier Interactive Patient Education  2017 [place] Prevention in the Home Falls can cause injuries. They can happen to people of all ages. There are many things you can do to make your home safe and to help prevent falls. What can I do on the outside of my home? Regularly fix the edges of walkways and driveways and fix any cracks. Remove anything that might make you trip as you walk through a door, such as a raised step or threshold. Trim any bushes or trees on the path to your home. Use bright outdoor lighting. Clear any walking paths of anything that might make someone trip, such as rocks or tools. Regularly check to see if handrails are loose or broken. Make sure that both sides of any steps have handrails. Any raised decks and porches should have guardrails on the edges. Have any leaves, snow, or ice cleared regularly. Use sand or salt on walking paths during winter. Clean up any spills in your garage right away. This includes oil or grease spills. What can I do in the bathroom? Use night lights. Install grab bars by the toilet and in the tub and shower. Do not use towel bars as grab bars. Use non-skid mats or decals in the tub or shower. If you need to sit down in the shower, use a plastic, non-slip stool. Keep the floor dry. Clean up any water that spills on the floor as soon  as it happens. Remove soap buildup in the tub or shower regularly. Attach bath mats securely with double-sided non-slip rug tape. Do not have throw rugs and other things on the floor that can make you trip. What can I do in the bedroom? Use night lights. Make sure that you have a light by your bed that is easy to reach. Do not use any sheets or blankets that are too big for your bed. They should not hang down onto the floor. Have a firm chair that has side arms. You can use this for support while you get dressed. Do not have throw rugs and other things on the floor that can make you trip. What can I do in the kitchen? Clean up any spills right away. Avoid walking on wet floors. Keep items that you use a lot in easy-to-reach places. If you need to reach something above you, use a strong step stool that has a grab bar. Keep electrical cords out of the way. Do not use floor polish or wax that makes floors slippery. If you must use wax, use non-skid floor wax. Do not have throw rugs and other things on the floor that can make you trip. What can I do with my stairs? Do not leave any items on the stairs. Make sure that there are handrails on both sides of the stairs and use them. Fix handrails that are broken or loose.  Make sure that handrails are as long as the stairways. Check any carpeting to make sure that it is firmly attached to the stairs. Fix any carpet that is loose or worn. Avoid having throw rugs at the top or bottom of the stairs. If you do have throw rugs, attach them to the floor with carpet tape. Make sure that you have a light switch at the top of the stairs and the bottom of the stairs. If you do not have them, ask someone to add them for you. What else can I do to help prevent falls? Wear shoes that: Do not have high heels. Have rubber bottoms. Are comfortable and fit you well. Are closed at the toe. Do not wear sandals. If you use a stepladder: Make sure that it is fully  opened. Do not climb a closed stepladder. Make sure that both sides of the stepladder are locked into place. Ask someone to hold it for you, if possible. Clearly mark and make sure that you can see: Any grab bars or handrails. First and last steps. Where the edge of each step is. Use tools that help you move around (mobility aids) if they are needed. These include: Canes. Walkers. Scooters. Crutches. Turn on the lights when you go into a dark area. Replace any light bulbs as soon as they burn out. Set up your furniture so you have a clear path. Avoid moving your furniture around. If any of your floors are uneven, fix them. If there are any pets around you, be aware of where they are. Review your medicines with your doctor. Some medicines can make you feel dizzy. This can increase your chance of falling. Ask your doctor what other things that you can do to help prevent falls. This information is not intended to replace advice given to you by your health care provider. Make sure you discuss any questions you have with your health care provider. Document Released: 08/26/2009 Document Revised: 04/06/2016 Document Reviewed: 12/04/2014 Elsevier Interactive Patient Education  2017 [employer].

## 2022-01-24 ENCOUNTER — Other Ambulatory Visit: Payer: Self-pay

## 2022-01-24 ENCOUNTER — Ambulatory Visit: Payer: Medicare Other

## 2022-01-24 VITALS — BP 144/68

## 2022-01-24 DIAGNOSIS — Z0130 Encounter for examination of blood pressure without abnormal findings: Secondary | ICD-10-CM

## 2022-01-24 NOTE — Progress Notes (Signed)
Pt BP Readings: ?01/18/22 143/61 ?01/19/22 134/56 ?01/20/22 125/50 ?01/21/22 134/56 ?01/22/22 145/55 ?01/23/22 142/54 ?

## 2022-03-10 ENCOUNTER — Ambulatory Visit (INDEPENDENT_AMBULATORY_CARE_PROVIDER_SITE_OTHER): Payer: Medicare Other | Admitting: Family Medicine

## 2022-03-10 ENCOUNTER — Encounter: Payer: Self-pay | Admitting: Family Medicine

## 2022-03-10 VITALS — BP 142/84 | HR 65 | Temp 97.8°F | Resp 18 | Ht 71.0 in | Wt 209.0 lb

## 2022-03-10 DIAGNOSIS — I350 Nonrheumatic aortic (valve) stenosis: Secondary | ICD-10-CM

## 2022-03-10 DIAGNOSIS — E785 Hyperlipidemia, unspecified: Secondary | ICD-10-CM

## 2022-03-10 DIAGNOSIS — E559 Vitamin D deficiency, unspecified: Secondary | ICD-10-CM

## 2022-03-10 DIAGNOSIS — M5416 Radiculopathy, lumbar region: Secondary | ICD-10-CM

## 2022-03-10 DIAGNOSIS — M159 Polyosteoarthritis, unspecified: Secondary | ICD-10-CM | POA: Diagnosis not present

## 2022-03-10 DIAGNOSIS — D692 Other nonthrombocytopenic purpura: Secondary | ICD-10-CM

## 2022-03-10 DIAGNOSIS — I10 Essential (primary) hypertension: Secondary | ICD-10-CM

## 2022-03-10 DIAGNOSIS — I739 Peripheral vascular disease, unspecified: Secondary | ICD-10-CM

## 2022-03-10 DIAGNOSIS — M19041 Primary osteoarthritis, right hand: Secondary | ICD-10-CM

## 2022-03-10 DIAGNOSIS — Z77098 Contact with and (suspected) exposure to other hazardous, chiefly nonmedicinal, chemicals: Secondary | ICD-10-CM

## 2022-03-10 DIAGNOSIS — M19042 Primary osteoarthritis, left hand: Secondary | ICD-10-CM

## 2022-03-10 DIAGNOSIS — R809 Proteinuria, unspecified: Secondary | ICD-10-CM

## 2022-03-10 DIAGNOSIS — G8929 Other chronic pain: Secondary | ICD-10-CM

## 2022-03-10 MED ORDER — VALSARTAN-HYDROCHLOROTHIAZIDE 320-12.5 MG PO TABS: 1.0000 | ORAL_TABLET | Freq: Every day | ORAL | 3 refills | Status: DC

## 2022-03-10 MED ORDER — AMLODIPINE BESYLATE 10 MG PO TABS: 10.0000 mg | ORAL_TABLET | Freq: Every day | ORAL | 1 refills | Status: DC

## 2022-03-10 MED ORDER — PREGABALIN 50 MG PO CAPS: 50.0000 mg | ORAL_CAPSULE | Freq: Every day | ORAL | 1 refills | Status: DC

## 2022-03-10 MED ORDER — ATORVASTATIN CALCIUM 40 MG PO TABS: 40.0000 mg | ORAL_TABLET | Freq: Every day | ORAL | 1 refills | Status: DC

## 2022-03-10 MED ORDER — OMEGA-3-ACID ETHYL ESTERS 1 G PO CAPS: 2.0000 | ORAL_CAPSULE | Freq: Two times a day (BID) | ORAL | 1 refills | Status: DC

## 2022-03-10 NOTE — Progress Notes (Addendum)
Name: Frank Hamilton   MRN: [id number]    DOB: March 12, 1941   Date:03/10/2022 ? ?     Progress Note ? ?Subjective ? ?Chief Complaint ? ?Chief Complaint  ?Patient presents with  ? Follow-up  ? ? ?HPI ? ?HTN: taking medication and denies side effects, no chest pain or palpitation He is  on ARB, HCTZ and Norvasc . BP at home is about 50 % of the time above 140 . We will adjust dose of Vaslartan from 160/12.5 to 320/12.5 mg . History of albuminuria, saw Dr. Abigail Butts  ? ?Exposure to [employer]: we will recheck A1C ? ?Dyslipidemia: Atorvastatin and lovaza  since triglycerides was  over 500, also taking aspirin daily . He denies side effects of medication, lab work from January 2022 reviewed, not fasting and triglycerides was 248 , HDL was low at 28, LDL at goal at 63 . He is eating fish twice a week , he states tired of tree nuts  ?  ?GERD:Triggered by greasy food he does not eat spicy food . Intermittent and controlled with Tums Unchanged  ?  ?OA: mostly on hands/right wrist  taking otc glucosamine, not on nsaid's except at most once a week takes Naproxen when very active. He saw Ortho in 2020 and was given reassurance. Aching pain, unable to make complete fist with left hand but stable  ? ?Low back pain: he was seen by Dr. Holley Raring, he has MRI ( below ), he states since started on Lyrica at night pain has resolved, he has leg fatigue when walking, explained that is likely from PVD and not the radiculitis.  No bowel or bladder incontinence. He had PT in the past but did not help. He has not been back to see him since 2022  ?  ?IMPRESSION: ?1. L3-4: Disc herniation more prominent towards the right with ?caudal migration down behind L4. Facet and ligamentous hypertrophy. ?Severe multifactorial stenosis at this level, particularly on the ?right. Neural compression seems certain at this level, particularly ?on the right affecting at least the L4 nerve. ?2. L4-5: Severe multifactorial spinal stenosis that could cause ?neural  compression on either or both sides. Foraminal stenosis right ?worse than left. ?3. L2-3: Mild multifactorial stenosis, most prominent in the left ?lateral recess. Left foraminal stenosis that could affect the left ?L2 nerve. ?4. L5-S1: Left foraminal stenosis because of facet osteophytes and ?bulging disc material could affect the exiting L5 nerve. ?  ? ?PVD with claudication: mildly abnormal ABI he had PT ,  he can walk down his driveway - 2/3 mile without having to stop. He takes statin therapy and aspirin 81 mg . He thinks symptoms are getting worse, getting heavier than usual ?  ?History of left kidney cyst : that went down in size from 2014 to 2015 and per studies stable, no hematuria or flank pain. Unchanged  ? ?AR: seasonal , taking Loratadine daily, still has nasal congestion but doing okay with medication. Stable  ? ?Mild aortic stenosis: had echo done in 2018, denies chest pain, palpitation, dizziness, today he agreed on going back to see cardiologist  ? 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 ?Patient Active Problem List  ? Diagnosis Date Noted  ? Spinal stenosis, lumbar region, with neurogenic claudication 02/10/2021  ? Chronic radicular lumbar pain 02/10/2021  ? Lumbar spondylosis 02/10/2021  ? Lumbar degenerative disc disease 02/10/2021  ? Chronic pain syndrome 02/10/2021  ? Positive for macroalbuminuria 08/06/2018  ? PVD (peripheral vascular disease)  with claudication ([place]) 07/02/2018  ? Senile purpura ([place]) 07/02/2018  ? Claudication ([place]) 04/23/2018  ? Vitamin D deficiency 05/02/2016  ? Osteoarthritis of both hands 05/01/2016  ? Hypertension, benign 08/03/2015  ? Hyperlipidemia 08/03/2015  ? Kidney cysts 12/15/2013  ? ? ?Past Surgical History:  ?Procedure Laterality Date  ? bowel obstruction  2011  ? CATARACT EXTRACTION, BILATERAL  2017  ? HERNIA REPAIR  2009 and 2010  ? TONSILLECTOMY    ? age 82 or 87  ? ? ?Family History  ?Problem Relation Age of Onset  ? Heart disease Mother    ? Stroke Father   ? Heart disease Sister   ? Diabetes Sister   ? Diabetes Maternal Grandfather   ? ? ?Social History  ? ?Tobacco Use  ? Smoking status: Former  ?  Packs/day: 1.00  ?  Years: 20.00  ?  Pack years: 20.00  ?  Types: Cigarettes  ?  Quit date: 06/08/1974  ?  Years since quitting: 47.7  ? Smokeless tobacco: Never  ?Substance Use Topics  ? Alcohol use: Yes  ?  Alcohol/week: 1.0 standard drink  ?  Types: 1 Cans of beer per week  ? ? ? ?Current Outpatient Medications:  ?  amLODipine (NORVASC) 10 MG tablet, Take 1 tablet (10 mg total) by mouth daily., Disp: 90 tablet, Rfl: 1 ?  aspirin 81 MG chewable tablet, Chew 81 mg by mouth., Disp: , Rfl:  ?  atorvastatin (LIPITOR) 40 MG tablet, Take 1 tablet (40 mg total) by mouth daily., Disp: 90 tablet, Rfl: 1 ?  Cholecalciferol (VITAMIN D) 2000 units CAPS, Take 1 capsule (2,000 Units total) by mouth daily., Disp: 30 capsule, Rfl:  ?  glucosamine-chondroitin 500-400 MG tablet, Take 1 tablet by mouth in the morning and at bedtime. , Disp: , Rfl:  ?  latanoprost (XALATAN) 0.005 % ophthalmic solution, SMARTSIG:1 Drop(s) In Eye(s) Every Evening, Disp: , Rfl:  ?  omega-3 acid ethyl esters (LOVAZA) 1 g capsule, Take 2 capsules (2 g total) by mouth 2 (two) times daily., Disp: 360 capsule, Rfl: 1 ?  pregabalin (LYRICA) 50 MG capsule, Take 1 capsule (50 mg total) by mouth at bedtime., Disp: 90 capsule, Rfl: 1 ?  valsartan-hydrochlorothiazide (DIOVAN-HCT) 160-12.5 MG tablet, Take 1 tablet by mouth daily., Disp: 90 tablet, Rfl: 1 ? ?Allergies  ?Allergen Reactions  ? Gabapentin Rash  ?  Headache also reported  ? Sulfa Antibiotics Itching and Rash  ? ? ?I personally reviewed active problem list, medication list, allergies, family history, social history with the patient/caregiver today. ? ? ?ROS ? ?Constitutional: Negative for fever or weight change.  ?Respiratory: Negative for cough and shortness of breath.   ?Cardiovascular: Negative for chest pain or palpitations.   ?Gastrointestinal: Negative for abdominal pain, no bowel changes.  ?Musculoskeletal: Negative for gait problem or joint swelling.  ?Skin: Negative for rash.  ?Neurological: Negative for dizziness or headache.  ?No other specific complaints in a complete review of systems (except as listed in HPI above).  ? ?Objective ? ?Vitals:  ? 03/10/22 1101  ?BP: (!) 142/84  ?Pulse: 65  ?Resp: 18  ?Temp: 97.8 ?F (36.6 ?C)  ?TempSrc: Oral  ?SpO2: 96%  ?Weight: 209 lb (94.8 kg)  ?Height: '5\' 11"'$  (1.803 m)  ? ? ?Body mass index is 29.15 kg/m?. ? ?Physical Exam ? ?Constitutional: Patient appears well-developed and well-nourished.  No distress.  ?HEENT: head atraumatic, normocephalic, pupils equal and reactive to light, neck supple ?Cardiovascular: Normal rate, regular rhythm  2/6 SEM most audible on right second intercostal space  No BLE edema. ?Pulmonary/Chest: Effort normal and breath sounds normal. No respiratory distress. ?Abdominal: Soft.  There is no tenderness. ?Muscular Skeletal: no pain during palpation of lumbar spine, tight hamstring on the right  ?Psychiatric: Patient has a normal mood and affect. behavior is normal. Judgment and thought content normal.  ? ?PHQ2/9: ? ?  01/17/2022  ? 11:11 AM 11/21/2021  ?  2:19 PM 09/09/2021  ? 10:57 AM 03/09/2021  ? 10:47 AM 02/10/2021  ? 10:16 AM  ?Depression screen PHQ 2/9  ?Decreased Interest 0 0 0 0 0  ?Down, Depressed, Hopeless 0 0 0 0 0  ?PHQ - 2 Score 0 0 0 0 0  ?Altered sleeping 1 0     ?Tired, decreased energy 1 0     ?Change in appetite 0 0     ?Feeling bad or failure about yourself  0 0     ?Trouble concentrating 0 0     ?Moving slowly or fidgety/restless 0 0     ?Suicidal thoughts 0 0     ?PHQ-9 Score 2 0     ?  ?phq 9 is negative ? ? ?Fall Risk: ? ?  01/17/2022  ? 11:17 AM 11/21/2021  ?  2:19 PM 09/09/2021  ? 10:57 AM 03/09/2021  ? 10:47 AM 02/10/2021  ? 10:16 AM  ?Fall Risk   ?Falls in the past year? 0 0 0 1 0  ?Number falls in past yr: 0 0  0 0  ?Injury with Fall? 0 0  0 0  ?Risk for  fall due to : No Fall Risks No Fall Risks     ?Follow up Falls prevention discussed Falls prevention discussed Falls prevention discussed    ? ? ?Assessment & Plan ? ?1. Senile purpura ([place]) ? ?Stable  ? ?2. Dyslipidemia

## 2022-03-11 LAB — CBC WITH DIFFERENTIAL/PLATELET
Absolute Monocytes: 745 cells/uL (ref 200–950)
Basophils Absolute: 28 cells/uL (ref 0–200)
Basophils Relative: 0.4 %
Eosinophils Absolute: 304 cells/uL (ref 15–500)
Eosinophils Relative: 4.4 %
HCT: 41.4 % (ref 38.5–50.0)
Hemoglobin: 14.2 g/dL (ref 13.2–17.1)
Lymphs Abs: 2305 cells/uL (ref 850–3900)
MCH: 31 pg (ref 27.0–33.0)
MCHC: 34.3 g/dL (ref 32.0–36.0)
MCV: 90.4 fL (ref 80.0–100.0)
MPV: 10.2 fL (ref 7.5–12.5)
Monocytes Relative: 10.8 %
Neutro Abs: 3519 cells/uL (ref 1500–7800)
Neutrophils Relative %: 51 %
Platelets: 195 10*3/uL (ref 140–400)
RBC: 4.58 10*6/uL (ref 4.20–5.80)
RDW: 14 % (ref 11.0–15.0)
Total Lymphocyte: 33.4 %
WBC: 6.9 10*3/uL (ref 3.8–10.8)

## 2022-03-11 LAB — COMPLETE METABOLIC PANEL WITH GFR
AG Ratio: 2 (calc) (ref 1.0–2.5)
ALT: 32 U/L (ref 9–46)
AST: 26 U/L (ref 10–35)
Albumin: 4.3 g/dL (ref 3.6–5.1)
Alkaline phosphatase (APISO): 49 U/L (ref 35–144)
BUN: 19 mg/dL (ref 7–25)
CO2: 28 mmol/L (ref 20–32)
Calcium: 9.6 mg/dL (ref 8.6–10.3)
Chloride: 104 mmol/L (ref 98–110)
Creat: 0.8 mg/dL (ref 0.70–1.28)
Globulin: 2.2 g/dL (calc) (ref 1.9–3.7)
Glucose, Bld: 99 mg/dL (ref 65–99)
Potassium: 4.3 mmol/L (ref 3.5–5.3)
Sodium: 141 mmol/L (ref 135–146)
Total Bilirubin: 0.6 mg/dL (ref 0.2–1.2)
Total Protein: 6.5 g/dL (ref 6.1–8.1)
eGFR: 91 mL/min/{1.73_m2} (ref >=60)

## 2022-03-11 LAB — HEMOGLOBIN A1C
Hgb A1c MFr Bld: 6 % of total Hgb — ABNORMAL HIGH (ref <5.7)
Mean Plasma Glucose: 126 mg/dL
eAG (mmol/L): 7 mmol/L

## 2022-03-11 LAB — LIPID PANEL
Cholesterol: 111 mg/dL (ref <200)
HDL: 26 mg/dL — ABNORMAL LOW (ref >=40)
LDL Cholesterol (Calc): 48 mg/dL (calc)
Non-HDL Cholesterol (Calc): 85 mg/dL (calc) (ref <130)
Total CHOL/HDL Ratio: 4.3 (calc) (ref <5.0)
Triglycerides: 348 mg/dL — ABNORMAL HIGH (ref <150)

## 2022-03-23 DIAGNOSIS — R600 Localized edema: Secondary | ICD-10-CM | POA: Diagnosis not present

## 2022-03-23 DIAGNOSIS — I10 Essential (primary) hypertension: Secondary | ICD-10-CM | POA: Diagnosis not present

## 2022-03-23 DIAGNOSIS — R809 Proteinuria, unspecified: Secondary | ICD-10-CM | POA: Diagnosis not present

## 2022-05-04 ENCOUNTER — Encounter: Payer: Medicare Other | Admitting: Dermatology

## 2022-06-02 ENCOUNTER — Ambulatory Visit: Payer: Medicare Other | Admitting: Cardiology

## 2022-07-24 ENCOUNTER — Ambulatory Visit: Payer: Medicare Other | Attending: Cardiology | Admitting: Cardiology

## 2022-07-24 ENCOUNTER — Encounter: Payer: Self-pay | Admitting: Cardiology

## 2022-07-24 VITALS — BP 142/78 | HR 54 | Ht 71.0 in | Wt 204.2 lb

## 2022-07-24 DIAGNOSIS — I350 Nonrheumatic aortic (valve) stenosis: Secondary | ICD-10-CM | POA: Diagnosis not present

## 2022-07-24 DIAGNOSIS — I10 Essential (primary) hypertension: Secondary | ICD-10-CM

## 2022-07-24 DIAGNOSIS — E7800 Pure hypercholesterolemia, unspecified: Secondary | ICD-10-CM

## 2022-07-24 NOTE — Patient Instructions (Signed)
Medication Instructions:   Your physician recommends that you continue on your current medications as directed. Please refer to the Current Medication list given to you today.   *If you need a refill on your cardiac medications before your next appointment, please call your pharmacy*   Testing/Procedures:  Your physician has requested that you have an echocardiogram. Echocardiography is a painless test that uses sound waves to create images of your heart. It provides your doctor with information about the size and shape of your heart and how well your heart's chambers and valves are working. This procedure takes approximately one hour. There are no restrictions for this procedure.    Follow-Up: At [facility], you and your health needs are our priority.  As part of our continuing mission to provide you with exceptional heart care, we have created designated Provider Care Teams.  These Care Teams include your primary Cardiologist (physician) and Advanced Practice Providers (APPs -  Physician Assistants and Nurse Practitioners) who all work together to provide you with the care you need, when you need it.  We recommend signing up for the patient portal called "MyChart".  Sign up information is provided on this After Visit Summary.  MyChart is used to connect with patients for Virtual Visits (Telemedicine).  Patients are able to view lab/test results, encounter notes, upcoming appointments, etc.  Non-urgent messages can be sent to your provider as well.   To learn more about what you can do with MyChart, go to [email or web address].    Your next appointment:   6 week(s)  The format for your next appointment:   In Person  Provider:   You may see Kate Sable, MD or one of the following Advanced Practice Providers on your designated Care Team:   Murray Hodgkins, NP Christell Faith, PA-C Cadence Kathlen Mody, PA-C Gerrie Nordmann, NP    Other Instructions   Important Information  About Sugar

## 2022-07-24 NOTE — Progress Notes (Signed)
Cardiology Office Note:    Date:  07/24/2022   ID:  ABDULKARIM EBERLIN, DOB [birth year], MRN [id number]  PCP:  Steele Sizer, MD   [place] Providers Cardiologist:  Kate Sable, MD     Referring MD: Steele Sizer, MD   Chief Complaint  Patient presents with   Other    Mild Aortic stenosis. Meds reviewed verbally with pt.    History of Present Illness:    Frank Hamilton is a 81 y.o. male with a hx of hypertension, hyperlipidemia, aortic stenosis who presents to establish care due to history of aortic valve disease.  Last seen in the practice in February 01, 2017, at the time echocardiogram was obtained due to a cardiac murmur.  Mild aortic valve stenosis was noted.  Ejection fraction was normal.  Has rare nonexertional chest pains, last occurrence of chest pain over a year ago, denies dizziness, presyncope or syncope.  Overall doing okay.  Past Medical History:  Diagnosis Date   Hyperlipidemia    Hypertension    Kidney cysts    Osteoarthritis    PVD (peripheral vascular disease) ([place])    Vitamin D deficiency     Past Surgical History:  Procedure Laterality Date   bowel obstruction  02-01-2010   CATARACT EXTRACTION, BILATERAL  2016/02/02   HERNIA REPAIR  02-02-2008 and Feb 01, 2009   TONSILLECTOMY     age 50 or 74    Current Medications: Current Meds  Medication Sig   amLODipine (NORVASC) 10 MG tablet Take 1 tablet (10 mg total) by mouth daily.   aspirin 81 MG chewable tablet Chew 81 mg by mouth.   atorvastatin (LIPITOR) 40 MG tablet Take 1 tablet (40 mg total) by mouth daily.   Cholecalciferol (VITAMIN D) 2000 units CAPS Take 1 capsule (2,000 Units total) by mouth daily.   glucosamine-chondroitin 500-400 MG tablet Take 1 tablet by mouth in the morning and at bedtime.    latanoprost (XALATAN) 0.005 % ophthalmic solution SMARTSIG:1 Drop(s) In Eye(s) Every Evening   omega-3 acid ethyl esters (LOVAZA) 1 g capsule Take 2 capsules (2 g total) by mouth 2 (two) times daily.   pregabalin  (LYRICA) 50 MG capsule Take 1 capsule (50 mg total) by mouth at bedtime.   valsartan-hydrochlorothiazide (DIOVAN-HCT) 320-12.5 MG tablet Take 1 tablet by mouth daily.     Allergies:   Gabapentin and Sulfa antibiotics   Social History   Socioeconomic History   Marital status: Widowed    Spouse name: Not on file   Number of children: 3   Years of education: Not on file   Highest education level: High school graduate  Occupational History   Occupation: retired  Tobacco Use   Smoking status: Former    Packs/day: 1.00    Years: 20.00    Total pack years: 20.00    Types: Cigarettes    Quit date: 06/08/1974    Years since quitting: 48.1   Smokeless tobacco: Never  Vaping Use   Vaping Use: Never used  Substance and Sexual Activity   Alcohol use: Yes    Alcohol/week: 1.0 standard drink of alcohol    Types: 1 Cans of beer per week    Comment: social   Drug use: No   Sexual activity: Not Currently  Other Topics Concern   Not on file  Social History Narrative   He was married for 37 years , wife died in 02-02-16. Lives with his dog   Social Determinants of Radio broadcast assistant  Strain: Low Risk  (01/17/2022)   Overall Financial Resource Strain (CARDIA)    Difficulty of Paying Living Expenses: Not hard at all  Food Insecurity: No Food Insecurity (01/17/2022)   Hunger Vital Sign    Worried About Running Out of Food in the Last Year: Never true    Ran Out of Food in the Last Year: Never true  Transportation Needs: No Transportation Needs (01/17/2022)   PRAPARE - Hydrologist (Medical): No    Lack of Transportation (Non-Medical): No  Physical Activity: Insufficiently Active (01/17/2022)   Exercise Vital Sign    Days of Exercise per Week: 7 days    Minutes of Exercise per Session: 10 min  Stress: No Stress Concern Present (01/17/2022)   [place]    Feeling of Stress : Not at all  Social  Connections: Socially Isolated (01/17/2022)   Social Connection and Isolation Panel [NHANES]    Frequency of Communication with Friends and Family: More than three times a week    Frequency of Social Gatherings with Friends and Family: Twice a week    Attends Religious Services: Never    Marine scientist or Organizations: No    Attends Archivist Meetings: Never    Marital Status: Widowed     Family History: The patient's family history includes Diabetes in his maternal grandfather and sister; Heart disease in his mother and sister; Stroke in his father.  ROS:   Please see the history of present illness.     All other systems reviewed and are negative.  EKGs/Labs/Other Studies Reviewed:    The following studies were reviewed today:   EKG:  EKG is  ordered today.  The ekg ordered today demonstrates sinus bradycardia, right bundle branch block  Recent Labs: 03/10/2022: ALT 32; BUN 19; Creat 0.80; Hemoglobin 14.2; Platelets 195; Potassium 4.3; Sodium 141  Recent Lipid Panel    Component Value Date/Time   CHOL 111 03/10/2022 1148   CHOL 197 08/04/2015 0935   TRIG 348 (H) 03/10/2022 1148   HDL 26 (L) 03/10/2022 1148   HDL 29 (L) 08/04/2015 0935   CHOLHDL 4.3 03/10/2022 1148   VLDL NOT CALC 06/27/2017 1044   LDLCALC 48 03/10/2022 1148     Risk Assessment/Calculations:     HYPERTENSION CONTROL Vitals:   07/24/22 1341 07/24/22 1343  BP: (!) 140/70 (!) 142/78    The patient's blood pressure is elevated above target today.  In order to address the patient's elevated BP: Blood pressure will be monitored at home to determine if medication changes need to be made.            Physical Exam:    VS:  BP (!) 142/78 (BP Location: Right Arm, Patient Position: Sitting, Cuff Size: Normal)   Pulse (!) 54   Ht '5\' 11"'$  (1.803 m)   Wt 204 lb 4 oz (92.6 kg)   SpO2 95%   BMI 28.49 kg/m     Wt Readings from Last 3 Encounters:  07/24/22 204 lb 4 oz (92.6 kg)   03/10/22 209 lb (94.8 kg)  01/17/22 206 lb 3.2 oz (93.5 kg)     GEN:  Well nourished, well developed in no acute distress HEENT: Normal NECK: No JVD; No carotid bruits LYMPHATICS: No lymphadenopathy CARDIAC: RRR, 2/6 systolic murmur RESPIRATORY:  Clear to auscultation without rales, wheezing or rhonchi  ABDOMEN: Soft, non-tender, non-distended MUSCULOSKELETAL:  No edema; No deformity  SKIN: Warm and dry NEUROLOGIC:  Alert and oriented x 3 PSYCHIATRIC:  Normal affect   ASSESSMENT:    1. Aortic valve stenosis, etiology of cardiac valve disease unspecified   2. Primary hypertension   3. Pure hypercholesterolemia    PLAN:    In order of problems listed above:  Aortic valve stenosis, echo 2018 showed mild aortic valve stenosis.  Repeat echocardiogram to evaluate any valvular disease progression. Hypertension, BP reasonable, continue current medications as prescribed.  May consider increasing HCTZ if BP stays elevated at follow-up visit. Hyperlipidemia, triglycerides elevated, LDL and total cholesterol controlled.  Continue Lipitor, continue low-cholesterol diet..  Follow-up  after echo.      Medication Adjustments/Labs and Tests Ordered: Current medicines are reviewed at length with the patient today.  Concerns regarding medicines are outlined above.  Orders Placed This Encounter  Procedures   EKG 12-Lead   ECHOCARDIOGRAM COMPLETE   No orders of the defined types were placed in this encounter.   Patient Instructions  Medication Instructions:   Your physician recommends that you continue on your current medications as directed. Please refer to the Current Medication list given to you today.   *If you need a refill on your cardiac medications before your next appointment, please call your pharmacy*   Testing/Procedures:  Your physician has requested that you have an echocardiogram. Echocardiography is a painless test that uses sound waves to create images of your  heart. It provides your doctor with information about the size and shape of your heart and how well your heart's chambers and valves are working. This procedure takes approximately one hour. There are no restrictions for this procedure.    Follow-Up: At [facility], you and your health needs are our priority.  As part of our continuing mission to provide you with exceptional heart care, we have created designated Provider Care Teams.  These Care Teams include your primary Cardiologist (physician) and Advanced Practice Providers (APPs -  Physician Assistants and Nurse Practitioners) who all work together to provide you with the care you need, when you need it.  We recommend signing up for the patient portal called "MyChart".  Sign up information is provided on this After Visit Summary.  MyChart is used to connect with patients for Virtual Visits (Telemedicine).  Patients are able to view lab/test results, encounter notes, upcoming appointments, etc.  Non-urgent messages can be sent to your provider as well.   To learn more about what you can do with MyChart, go to [email or web address].    Your next appointment:   6 week(s)  The format for your next appointment:   In Person  Provider:   You may see Kate Sable, MD or one of the following Advanced Practice Providers on your designated Care Team:   Murray Hodgkins, NP Christell Faith, PA-C Cadence Kathlen Mody, PA-C Gerrie Nordmann, NP    Other Instructions   Important Information About Sugar         Signed, Kate Sable, MD  07/24/2022 2:42 PM    [place]

## 2022-08-16 DIAGNOSIS — H401111 Primary open-angle glaucoma, right eye, mild stage: Secondary | ICD-10-CM | POA: Diagnosis not present

## 2022-08-16 DIAGNOSIS — H40052 Ocular hypertension, left eye: Secondary | ICD-10-CM | POA: Diagnosis not present

## 2022-08-23 ENCOUNTER — Ambulatory Visit: Payer: Medicare Other | Attending: Cardiology

## 2022-08-23 DIAGNOSIS — I350 Nonrheumatic aortic (valve) stenosis: Secondary | ICD-10-CM | POA: Diagnosis not present

## 2022-08-23 LAB — ECHOCARDIOGRAM COMPLETE
AR max vel: 1.65 cm2
AV Area VTI: 1.8 cm2
AV Area mean vel: 1.51 cm2
AV Mean grad: 19 mmHg
AV Peak grad: 27.9 mmHg
AV Vena cont: 0.3 cm
Ao pk vel: 2.64 m/s
Area-P 1/2: 2.19 cm2
Calc EF: 70.8 %
S' Lateral: 2.9 cm
Single Plane A2C EF: 68.9 %
Single Plane A4C EF: 72.9 %

## 2022-09-07 NOTE — Progress Notes (Addendum)
Name: Frank Hamilton   MRN: [id number]    DOB: 09/14/1941   Date:09/08/2022       Progress Note  Subjective  Chief Complaint  Follow Up  HPI  HTN: taking medication and denies side effects, no chest pain or palpitation He is  on ARB, HCTZ and Norvasc . We adjusted dose of valsartan hctz to 320/12.5 mg Spring of 2023 and his bp is now at goal, he denies orthostatic changes.   Exposure to Agent Orange: A1C done 02/2022 was 6 %   Dyslipidemia: Atorvastatin and lovaza , reviewed last labs done 02/2022. LDL at goal, down to 48, good cholesterol is still low at 26, triglycerides elevated at 348 but better than it used to be.    GERD:Triggered by greasy food he does not eat spicy food . Intermittent and controlled with Tums  , unchanged    OA: mostly on hands/right wrist  taking otc glucosamine, not on nsaid's except at most once a week takes Naproxen when very active. He saw Ortho in 2020 and was given reassurance. Aching pain, unable to make complete fist with left hand but stable   Low back pain: he used to see  Dr. Holley Raring, he has MRI ( below ), he states since started on Lyrica at night pain has resolved, he has leg fatigue when walking, explained that is likely from PVD and not the radiculitis.  No bowel or bladder incontinence. He had PT in the past but did not help. He has not been back to see him since 2022    IMPRESSION: 1. L3-4: Disc herniation more prominent towards the right with caudal migration down behind L4. Facet and ligamentous hypertrophy. Severe multifactorial stenosis at this level, particularly on the right. Neural compression seems certain at this level, particularly on the right affecting at least the L4 nerve. 2. L4-5: Severe multifactorial spinal stenosis that could cause neural compression on either or both sides. Foraminal stenosis right worse than left. 3. L2-3: Mild multifactorial stenosis, most prominent in the left lateral recess. Left foraminal stenosis that  could affect the left L2 nerve. 4. L5-S1: Left foraminal stenosis because of facet osteophytes and bulging disc material could affect the exiting L5 nerve.    PVD with claudication: mildly abnormal ABI he had PT ,  he can walk down his driveway - 2/3 mile without having to stop. He takes statin therapy and aspirin 81 mg . LDL is at goal . Symptoms are stable   History of left kidney cyst : that went down in size from 2014 to 2015 and per studies stable, no hematuria or flank pain. Unchanged   AR: seasonal , taking Loratadine prn and is doing okay at this time of the year   Mild aortic stenosis: had echo done in 2018, denies chest pain, palpitation, dizziness, he went back to cardiologist and had repeat Echo 08/2022 and stable   IMPRESSIONS Left ventricular ejection fraction, by estimation, is 60 to 65%. The left ventricle has normal function. The left ventricle has no regional wall motion abnormalities. There is moderate asymmetric left ventricular hypertrophy of the septal segment. Left ventricular diastolic parameters are consistent with Grade I diastolic dysfunction (impaired relaxation). The average left ventricular global longitudinal strain is -18.2 %. The global longitudinal strain is normal. 1. 2. Right ventricular systolic function is normal. The right ventricular size is normal. 3. The mitral valve is normal in structure. No evidence of mitral valve regurgitation. The aortic valve is tricuspid. Aortic valve  regurgitation is mild. Mild aortic valve stenosis. Aortic valve mean gradient measures 19.0 mmHg. 4. The inferior vena cava is normal in size with greater than 50% respiratory variability, suggesting right atrial pressure of 3 mmHg.   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 He  will see cardiologist this week and advise him to discuss it with him , they may order a Zio monitor or do carotid doppler.   Patient Active Problem List   Diagnosis Date Noted   Spinal stenosis, lumbar region, with neurogenic claudication 02/10/2021   Chronic radicular lumbar pain 02/10/2021   Lumbar spondylosis 02/10/2021   Lumbar degenerative disc disease 02/10/2021   Chronic pain syndrome 02/10/2021   Positive for macroalbuminuria 08/06/2018   PVD (peripheral vascular disease) with claudication ([place]) 07/02/2018   Senile purpura ([place]) 07/02/2018   Claudication ([place]) 04/23/2018   Vitamin D deficiency 05/02/2016   Osteoarthritis of both hands 05/01/2016   Hypertension, benign 08/03/2015   Hyperlipidemia 08/03/2015   Kidney cysts 12/15/2013    Past Surgical History:  Procedure Laterality Date   bowel obstruction  2011   CATARACT EXTRACTION, BILATERAL  2017   HERNIA REPAIR  2009 and 2010   TONSILLECTOMY     age 51 or 74    Family History  Problem Relation Age of Onset   Heart disease Mother    Stroke Father    Heart disease Sister    Diabetes Sister    Diabetes Maternal Grandfather     Social History   Tobacco Use   Smoking status: Former    Packs/day: 1.00    Years: 20.00    Total pack years: 20.00    Types: Cigarettes    Quit date: 06/08/1974    Years since quitting: 48.2   Smokeless tobacco: Never  Substance Use Topics   Alcohol use: Yes    Alcohol/week: 1.0 standard drink of alcohol    Types: 1 Cans of beer per week    Comment: social     Current Outpatient Medications:    amLODipine (NORVASC) 10 MG tablet, Take 1 tablet (10 mg total) by mouth daily., Disp: 90 tablet, Rfl: 1   aspirin 81 MG chewable tablet, Chew 81 mg by mouth., Disp: , Rfl:    atorvastatin (LIPITOR) 40 MG tablet, Take 1 tablet (40 mg total) by mouth daily., Disp: 90 tablet, Rfl: 1   Cholecalciferol (VITAMIN D) 2000 units CAPS, Take 1 capsule (2,000 Units total) by mouth daily., Disp: 30  capsule, Rfl:    glucosamine-chondroitin 500-400 MG tablet, Take 1 tablet by mouth in the morning and at bedtime. , Disp: , Rfl:    latanoprost (XALATAN) 0.005 % ophthalmic solution, SMARTSIG:1 Drop(s) In Eye(s) Every Evening, Disp: , Rfl:    omega-3 acid ethyl esters (LOVAZA) 1 g capsule, Take 2 capsules (2 g total) by mouth 2 (two) times daily., Disp: 360 capsule, Rfl: 1   pregabalin (LYRICA) 50 MG capsule, Take 1 capsule (50 mg total) by mouth at bedtime., Disp: 90 capsule, Rfl: 1   valsartan-hydrochlorothiazide (DIOVAN-HCT) 320-12.5 MG tablet, Take 1 tablet by mouth daily., Disp: 90 tablet, Rfl: 3  Allergies  Allergen Reactions   Gabapentin Rash    Headache also reported   Sulfa Antibiotics Itching and Rash    I personally reviewed active problem list,  medication list, allergies, family history, social history, health maintenance with the patient/caregiver today.   ROS  Constitutional: Negative for fever , positive for weight change - down 6 lbs, he states he has been more active in his yard .  Respiratory: Negative for cough and shortness of breath.   Cardiovascular: Negative for chest pain or palpitations.  Gastrointestinal: Negative for abdominal pain, no bowel changes.  Musculoskeletal: positive for gait problem but no  joint swelling.  Skin: Negative for rash.  Neurological: Negative for dizziness or headache.  No other specific complaints in a complete review of systems (except as listed in HPI above).   Objective  Vitals:   09/08/22 1101  BP: 122/64  Pulse: 60  Resp: 16  Temp: 97.7 F (36.5 C)  TempSrc: Oral  SpO2: 97%  Weight: 198 lb 6.4 oz (90 kg)  Height: '5\' 11"'$  (1.803 m)    Body mass index is 27.67 kg/m.  Physical Exam  Constitutional: Patient appears well-developed and well-nourished.  No distress.  HEENT: head atraumatic, normocephalic, pupils equal and reactive to light,, neck supple Cardiovascular: Normal rate, regular rhythm and normal heart  sounds.  2/6 sem on 2nd right intercostal space  No BLE edema. Negative for carotid bruit  Pulmonary/Chest: Effort normal and breath sounds normal. No respiratory distress. Abdominal: Soft.  There is no tenderness. Psychiatric: Patient has a normal mood and affect. behavior is normal. Judgment and thought content normal.   Recent Results (from the past 2160 hour(s))  ECHOCARDIOGRAM COMPLETE     Status: None   Collection Time: 08/23/22 12:44 PM  Result Value Ref Range   AR max vel 1.65 cm2   AV Peak grad 27.9 mmHg   Ao pk vel 2.64 m/s   S' Lateral 2.90 cm   Area-P 1/2 2.19 cm2   AV Area VTI 1.80 cm2   AV Mean grad 19.0 mmHg   Single Plane A4C EF 72.9 %   Single Plane A2C EF 68.9 %   Calc EF 70.8 %   AV Area mean vel 1.51 cm2   AV Vena cont 0.30 cm    PHQ2/9:    09/08/2022   11:10 AM 03/10/2022   11:03 AM 01/17/2022   11:11 AM 11/21/2021    2:19 PM 09/09/2021   10:57 AM  Depression screen PHQ 2/9  Decreased Interest 0 0 0 0 0  Down, Depressed, Hopeless 0 0 0 0 0  PHQ - 2 Score 0 0 0 0 0  Altered sleeping 0 1 1 0   Tired, decreased energy 0 1 1 0   Change in appetite 0 0 0 0   Feeling bad or failure about yourself  0 0 0 0   Trouble concentrating 0 0 0 0   Moving slowly or fidgety/restless 0 0 0 0   Suicidal thoughts 0 0 0 0   PHQ-9 Score 0 2 2 0   Difficult doing work/chores  Not difficult at all       phq 9 is negative   Fall Risk:    09/08/2022   11:07 AM 03/10/2022   11:02 AM 01/17/2022   11:17 AM 11/21/2021    2:19 PM 09/09/2021   10:57 AM  Fall Risk   Falls in the past year? 0 0 0 0 0  Number falls in past yr:   0 0   Injury with Fall?   0 0   Risk for fall due to :  No Fall Risks No Fall Risks  No Fall Risks   Follow up  Falls prevention discussed Falls prevention discussed Falls prevention discussed Falls prevention discussed      Functional Status Survey: Is the patient deaf or have difficulty hearing?: No Does the patient have difficulty seeing, even  when wearing glasses/contacts?: No Does the patient have difficulty concentrating, remembering, or making decisions?: No Does the patient have difficulty walking or climbing stairs?: Yes Does the patient have difficulty dressing or bathing?: No Does the patient have difficulty doing errands alone such as visiting a doctor's office or shopping?: No    Assessment & Plan  1. Chronic radicular lumbar pain (RIGHT)  - pregabalin (LYRICA) 50 MG capsule; Take 1 capsule (50 mg total) by mouth at bedtime.  Dispense: 90 capsule; Refill: 1  2. PVD (peripheral vascular disease) with claudication ([facility])  On statin and aspirin   3. Need for immunization against influenza  - Flu Vaccine QUAD High Dose(Fluad)  4. Senile purpura ([place])  Reassurance given   5. Dyslipidemia  - omega-3 acid ethyl esters (LOVAZA) 1 g capsule; Take 2 capsules (2 g total) by mouth 2 (two) times daily.  Dispense: 360 capsule; Refill: 1 - atorvastatin (LIPITOR) 40 MG tablet; Take 1 tablet (40 mg total) by mouth daily.  Dispense: 90 tablet; Refill: 1  6. Essential hypertension  - amLODipine (NORVASC) 10 MG tablet; Take 1 tablet (10 mg total) by mouth daily.  Dispense: 90 tablet; Refill: 1  7. Primary osteoarthritis of both hands   8. DDD (degenerative disc disease), lumbar   9. Aortic stenosis, mild   10. Exposure to Agent Orange   Last A1C was 6 %    11. Tinnitus of both ears   12. Bilateral hearing loss, unspecified hearing loss type   13. Palpitation

## 2022-09-08 ENCOUNTER — Encounter: Payer: Self-pay | Admitting: Family Medicine

## 2022-09-08 ENCOUNTER — Ambulatory Visit (INDEPENDENT_AMBULATORY_CARE_PROVIDER_SITE_OTHER): Payer: Medicare Other | Admitting: Family Medicine

## 2022-09-08 VITALS — BP 122/64 | HR 60 | Temp 97.7°F | Resp 16 | Ht 71.0 in | Wt 198.4 lb

## 2022-09-08 DIAGNOSIS — I350 Nonrheumatic aortic (valve) stenosis: Secondary | ICD-10-CM | POA: Insufficient documentation

## 2022-09-08 DIAGNOSIS — Z23 Encounter for immunization: Secondary | ICD-10-CM | POA: Diagnosis not present

## 2022-09-08 DIAGNOSIS — H9313 Tinnitus, bilateral: Secondary | ICD-10-CM

## 2022-09-08 DIAGNOSIS — D692 Other nonthrombocytopenic purpura: Secondary | ICD-10-CM | POA: Diagnosis not present

## 2022-09-08 DIAGNOSIS — I739 Peripheral vascular disease, unspecified: Secondary | ICD-10-CM

## 2022-09-08 DIAGNOSIS — R002 Palpitations: Secondary | ICD-10-CM

## 2022-09-08 DIAGNOSIS — M5416 Radiculopathy, lumbar region: Secondary | ICD-10-CM

## 2022-09-08 DIAGNOSIS — H9193 Unspecified hearing loss, bilateral: Secondary | ICD-10-CM

## 2022-09-08 DIAGNOSIS — I10 Essential (primary) hypertension: Secondary | ICD-10-CM

## 2022-09-08 DIAGNOSIS — M19041 Primary osteoarthritis, right hand: Secondary | ICD-10-CM

## 2022-09-08 DIAGNOSIS — G8929 Other chronic pain: Secondary | ICD-10-CM

## 2022-09-08 DIAGNOSIS — E785 Hyperlipidemia, unspecified: Secondary | ICD-10-CM | POA: Diagnosis not present

## 2022-09-08 DIAGNOSIS — M19042 Primary osteoarthritis, left hand: Secondary | ICD-10-CM

## 2022-09-08 DIAGNOSIS — Z77098 Contact with and (suspected) exposure to other hazardous, chiefly nonmedicinal, chemicals: Secondary | ICD-10-CM

## 2022-09-08 DIAGNOSIS — M5136 Other intervertebral disc degeneration, lumbar region: Secondary | ICD-10-CM

## 2022-09-08 MED ORDER — AMLODIPINE BESYLATE 10 MG PO TABS: 10.0000 mg | ORAL_TABLET | Freq: Every day | ORAL | 1 refills | Status: DC

## 2022-09-08 MED ORDER — PREGABALIN 50 MG PO CAPS: 50.0000 mg | ORAL_CAPSULE | Freq: Every day | ORAL | 1 refills | Status: DC

## 2022-09-08 MED ORDER — ATORVASTATIN CALCIUM 40 MG PO TABS: 40.0000 mg | ORAL_TABLET | Freq: Every day | ORAL | 1 refills | Status: DC

## 2022-09-08 MED ORDER — OMEGA-3-ACID ETHYL ESTERS 1 G PO CAPS: 2.0000 | ORAL_CAPSULE | Freq: Two times a day (BID) | ORAL | 1 refills | Status: DC

## 2022-09-08 NOTE — Patient Instructions (Signed)
Please get  RSV and Shingrix at local pharmacy

## 2022-09-11 ENCOUNTER — Encounter: Payer: Self-pay | Admitting: Cardiology

## 2022-09-11 ENCOUNTER — Ambulatory Visit: Payer: Medicare Other | Attending: Cardiology | Admitting: Cardiology

## 2022-09-11 VITALS — BP 140/62 | HR 56 | Ht 71.0 in | Wt 202.0 lb

## 2022-09-11 DIAGNOSIS — E7800 Pure hypercholesterolemia, unspecified: Secondary | ICD-10-CM

## 2022-09-11 DIAGNOSIS — I350 Nonrheumatic aortic (valve) stenosis: Secondary | ICD-10-CM | POA: Diagnosis not present

## 2022-09-11 DIAGNOSIS — I10 Essential (primary) hypertension: Secondary | ICD-10-CM | POA: Diagnosis not present

## 2022-09-11 MED ORDER — HYDROCHLOROTHIAZIDE 12.5 MG PO CAPS: 12.5000 mg | ORAL_CAPSULE | Freq: Every day | ORAL | 3 refills | Status: DC

## 2022-09-11 NOTE — Progress Notes (Signed)
Cardiology Office Note:    Date:  09/11/2022   ID:  DEZMIN KITTELSON, DOB 26-Dec-1940, MRN [id number]  PCP:  Steele Sizer, MD   [place] Providers Cardiologist:  Kate Sable, MD     Referring MD: Steele Sizer, MD   Chief Complaint  Patient presents with   Follow-up    HTN, no new cardiac concerns     History of Present Illness:    Frank Hamilton is a 81 y.o. male with a hx of hypertension, hyperlipidemia, aortic stenosis who presents for follow-up.  Previously seen to establish care due to history of aortic valve disease.  Repeat echocardiogram was obtained to evaluate any progression of aortic valve disease.  He otherwise feels okay, no new concerns at this time.  Checks his blood pressure frequently at home, systolics average in the mid 130s.  Otherwise feels well, denies chest pain or shortness of breath.   Prior notes Echo 2018 EF 65 to 70%, mild aortic valve stenosis  Past Medical History:  Diagnosis Date   Hyperlipidemia    Hypertension    Kidney cysts    Osteoarthritis    PVD (peripheral vascular disease) ([place])    Vitamin D deficiency     Past Surgical History:  Procedure Laterality Date   bowel obstruction  2011   CATARACT EXTRACTION, BILATERAL  2017   HERNIA REPAIR  2009 and 2010   TONSILLECTOMY     age 65 or 61    Current Medications: Current Meds  Medication Sig   amLODipine (NORVASC) 10 MG tablet Take 1 tablet (10 mg total) by mouth daily.   aspirin 81 MG chewable tablet Chew 81 mg by mouth.   atorvastatin (LIPITOR) 40 MG tablet Take 1 tablet (40 mg total) by mouth daily.   Cholecalciferol (VITAMIN D) 2000 units CAPS Take 1 capsule (2,000 Units total) by mouth daily.   glucosamine-chondroitin 500-400 MG tablet Take 1 tablet by mouth in the morning and at bedtime.    hydrochlorothiazide (MICROZIDE) 12.5 MG capsule Take 1 capsule (12.5 mg total) by mouth daily.   latanoprost (XALATAN) 0.005 % ophthalmic solution SMARTSIG:1  Drop(s) In Eye(s) Every Evening   omega-3 acid ethyl esters (LOVAZA) 1 g capsule Take 2 capsules (2 g total) by mouth 2 (two) times daily.   pregabalin (LYRICA) 50 MG capsule Take 1 capsule (50 mg total) by mouth at bedtime.   valsartan-hydrochlorothiazide (DIOVAN-HCT) 320-12.5 MG tablet Take 1 tablet by mouth daily.     Allergies:   Gabapentin and Sulfa antibiotics   Social History   Socioeconomic History   Marital status: Widowed    Spouse name: Not on file   Number of children: 3   Years of education: Not on file   Highest education level: High school graduate  Occupational History   Occupation: retired  Tobacco Use   Smoking status: Former    Packs/day: 1.00    Years: 20.00    Total pack years: 20.00    Types: Cigarettes    Quit date: 06/08/1974    Years since quitting: 48.2   Smokeless tobacco: Never  Vaping Use   Vaping Use: Never used  Substance and Sexual Activity   Alcohol use: Yes    Alcohol/week: 1.0 standard drink of alcohol    Types: 1 Cans of beer per week    Comment: social   Drug use: No   Sexual activity: Not Currently  Other Topics Concern   Not on file  Social History Narrative  He was married for 67 years , wife died in 01-Feb-2016. Lives with his dog   Social Determinants of Health   Financial Resource Strain: Low Risk  (01/17/2022)   Overall Financial Resource Strain (CARDIA)    Difficulty of Paying Living Expenses: Not hard at all  Food Insecurity: No Food Insecurity (01/17/2022)   Hunger Vital Sign    Worried About Running Out of Food in the Last Year: Never true    Ran Out of Food in the Last Year: Never true  Transportation Needs: No Transportation Needs (01/17/2022)   PRAPARE - Hydrologist (Medical): No    Lack of Transportation (Non-Medical): No  Physical Activity: Insufficiently Active (01/17/2022)   Exercise Vital Sign    Days of Exercise per Week: 7 days    Minutes of Exercise per Session: 10 min  Stress: No Stress  Concern Present (01/17/2022)   [place]    Feeling of Stress : Not at all  Social Connections: Socially Isolated (01/17/2022)   Social Connection and Isolation Panel [NHANES]    Frequency of Communication with Friends and Family: More than three times a week    Frequency of Social Gatherings with Friends and Family: Twice a week    Attends Religious Services: Never    Marine scientist or Organizations: No    Attends Archivist Meetings: Never    Marital Status: Widowed     Family History: The patient's family history includes Diabetes in his maternal grandfather and sister; Heart disease in his mother and sister; Stroke in his father.  ROS:   Please see the history of present illness.     All other systems reviewed and are negative.  EKGs/Labs/Other Studies Reviewed:    The following studies were reviewed today:   EKG:  EKG is  ordered today.  The ekg ordered today demonstrates sinus bradycardia, right bundle branch block  Recent Labs: 03/10/2022: ALT 32; BUN 19; Creat 0.80; Hemoglobin 14.2; Platelets 195; Potassium 4.3; Sodium 141  Recent Lipid Panel    Component Value Date/Time   CHOL 111 03/10/2022 1148   CHOL 197 08/04/2015 0935   TRIG 348 (H) 03/10/2022 1148   HDL 26 (L) 03/10/2022 1148   HDL 29 (L) 08/04/2015 0935   CHOLHDL 4.3 03/10/2022 1148   VLDL NOT CALC 06/27/2017 1044   LDLCALC 48 03/10/2022 1148     Risk Assessment/Calculations:     HYPERTENSION CONTROL Vitals:   09/11/22 1122 09/11/22 1124  BP: (!) 142/62 (!) 140/62    The patient's blood pressure is elevated above target today.  In order to address the patient's elevated BP: A new medication was prescribed today.          Physical Exam:    VS:  BP (!) 140/62 (BP Location: Left Arm)   Pulse (!) 56   Ht '5\' 11"'$  (1.803 m)   Wt 202 lb (91.6 kg)   SpO2 94%   BMI 28.17 kg/m     Wt Readings from Last 3  Encounters:  09/11/22 202 lb (91.6 kg)  09/08/22 198 lb 6.4 oz (90 kg)  07/24/22 204 lb 4 oz (92.6 kg)     GEN:  Well nourished, well developed in no acute distress HEENT: Normal NECK: No JVD; No carotid bruits CARDIAC: RRR, 2/6 systolic murmur RESPIRATORY:  Clear to auscultation without rales, wheezing or rhonchi  ABDOMEN: Soft, non-tender, non-distended MUSCULOSKELETAL:  No edema;  No deformity  SKIN: Warm and dry NEUROLOGIC:  Alert and oriented x 3 PSYCHIATRIC:  Normal affect   ASSESSMENT:    1. Aortic valve stenosis, etiology of cardiac valve disease unspecified   2. Primary hypertension   3. Pure hypercholesterolemia    PLAN:    In order of problems listed above:  History of mild aortic valve stenosis.  Repeat echo 08/2022 EF 60 to 65%, mild aortic valve stenosis, mean gradient 19 mmHg.  No significant change from prior.  Continue serial monitoring Hypertension, slightly elevated, averaging 130s at home.  Start HCTZ 12.5 mg daily, continue Norvasc 10, Diovan HCT.  Low-salt diet, increased activity emphasized. Hyperlipidemia, Continue Lipitor, continue low-cholesterol diet..  Follow-up in 1 year      Medication Adjustments/Labs and Tests Ordered: Current medicines are reviewed at length with the patient today.  Concerns regarding medicines are outlined above.  No orders of the defined types were placed in this encounter.  Meds ordered this encounter  Medications   hydrochlorothiazide (MICROZIDE) 12.5 MG capsule    Sig: Take 1 capsule (12.5 mg total) by mouth daily.    Dispense:  90 capsule    Refill:  3    Patient Instructions  Medication Instructions:   Your physician has recommended you make the following change in your medication:    START taking Hydrochlorothiazide 12.5 MG once a day.  *If you need a refill on your cardiac medications before your next appointment, please call your pharmacy*   Follow-Up: At [facility], you and your health  needs are our priority.  As part of our continuing mission to provide you with exceptional heart care, we have created designated Provider Care Teams.  These Care Teams include your primary Cardiologist (physician) and Advanced Practice Providers (APPs -  Physician Assistants and Nurse Practitioners) who all work together to provide you with the care you need, when you need it.  We recommend signing up for the patient portal called "MyChart".  Sign up information is provided on this After Visit Summary.  MyChart is used to connect with patients for Virtual Visits (Telemedicine).  Patients are able to view lab/test results, encounter notes, upcoming appointments, etc.  Non-urgent messages can be sent to your provider as well.   To learn more about what you can do with MyChart, go to [email or web address].    Your next appointment:   1 year(s)  The format for your next appointment:   In Person  Provider:   Kate Sable, MD    Other Instructions   Important Information About Sugar         Signed, Kate Sable, MD  09/11/2022 12:26 PM    [place]

## 2022-09-11 NOTE — Patient Instructions (Signed)
Medication Instructions:   Your physician has recommended you make the following change in your medication:    START taking Hydrochlorothiazide 12.5 MG once a day.  *If you need a refill on your cardiac medications before your next appointment, please call your pharmacy*   Follow-Up: At [facility], you and your health needs are our priority.  As part of our continuing mission to provide you with exceptional heart care, we have created designated Provider Care Teams.  These Care Teams include your primary Cardiologist (physician) and Advanced Practice Providers (APPs -  Physician Assistants and Nurse Practitioners) who all work together to provide you with the care you need, when you need it.  We recommend signing up for the patient portal called "MyChart".  Sign up information is provided on this After Visit Summary.  MyChart is used to connect with patients for Virtual Visits (Telemedicine).  Patients are able to view lab/test results, encounter notes, upcoming appointments, etc.  Non-urgent messages can be sent to your provider as well.   To learn more about what you can do with MyChart, go to [email or web address].    Your next appointment:   1 year(s)  The format for your next appointment:   In Person  Provider:   Kate Sable, MD    Other Instructions   Important Information About Sugar

## 2022-09-13 ENCOUNTER — Ambulatory Visit: Payer: Medicare Other | Admitting: Dermatology

## 2022-09-13 DIAGNOSIS — Z1283 Encounter for screening for malignant neoplasm of skin: Secondary | ICD-10-CM

## 2022-09-13 DIAGNOSIS — L918 Other hypertrophic disorders of the skin: Secondary | ICD-10-CM

## 2022-09-13 DIAGNOSIS — L578 Other skin changes due to chronic exposure to nonionizing radiation: Secondary | ICD-10-CM | POA: Diagnosis not present

## 2022-09-13 DIAGNOSIS — L820 Inflamed seborrheic keratosis: Secondary | ICD-10-CM | POA: Diagnosis not present

## 2022-09-13 DIAGNOSIS — L814 Other melanin hyperpigmentation: Secondary | ICD-10-CM | POA: Diagnosis not present

## 2022-09-13 DIAGNOSIS — L821 Other seborrheic keratosis: Secondary | ICD-10-CM

## 2022-09-13 DIAGNOSIS — D229 Melanocytic nevi, unspecified: Secondary | ICD-10-CM

## 2022-09-13 NOTE — Patient Instructions (Signed)
Due to recent changes in healthcare laws, you may see results of your pathology and/or laboratory studies on MyChart before the doctors have had a chance to review them. We understand that in some cases there may be results that are confusing or concerning to you. Please understand that not all results are received at the same time and often the doctors may need to interpret multiple results in order to provide you with the best plan of care or course of treatment. Therefore, we ask that you please give us 2 business days to thoroughly review all your results before contacting the office for clarification. Should we see a critical lab result, you will be contacted sooner.   If You Need Anything After Your Visit  If you have any questions or concerns for your doctor, please call our main line at [phone] and press option 4 to reach your doctor's medical assistant. If no one answers, please leave a voicemail as directed and we will return your call as soon as possible. Messages left after 4 pm will be answered the following business day.   You may also send us a message via MyChart. We typically respond to MyChart messages within 1-2 business days.  For prescription refills, please ask your pharmacy to contact our office. Our fax number is [phone].  If you have an urgent issue when the clinic is closed that cannot wait until the next business day, you can page your doctor at the number below.    Please note that while we do our best to be available for urgent issues outside of office hours, we are not available 24/7.   If you have an urgent issue and are unable to reach us, you may choose to seek medical care at your doctor's office, retail clinic, urgent care center, or emergency room.  If you have a medical emergency, please immediately call 911 or go to the emergency department.  Pager Numbers  - Dr. Kowalski: [phone]  - Dr. Moye: [phone]  - Dr. Stewart:  [phone]  In the event of inclement weather, please call our main line at [phone] for an update on the status of any delays or closures.  Dermatology Medication Tips: Please keep the boxes that topical medications come in in order to help keep track of the instructions about where and how to use these. Pharmacies typically print the medication instructions only on the boxes and not directly on the medication tubes.   If your medication is too expensive, please contact our office at [phone] option 4 or send us a message through MyChart.   We are unable to tell what your co-pay for medications will be in advance as this is different depending on your insurance coverage. However, we may be able to find a substitute medication at lower cost or fill out paperwork to get insurance to cover a needed medication.   If a prior authorization is required to get your medication covered by your insurance company, please allow us 1-2 business days to complete this process.  Drug prices often vary depending on where the prescription is filled and some pharmacies may offer cheaper prices.  The website www.goodrx.com contains coupons for medications through different pharmacies. The prices here do not account for what the cost may be with help from insurance (it may be cheaper with your insurance), but the website can give you the price if you did not use any insurance.  - You can print the associated coupon and take it with   your prescription to the pharmacy.  - You may also stop by our office during regular business hours and pick up a GoodRx coupon card.  - If you need your prescription sent electronically to a different pharmacy, notify our office through [place] MyChart or by phone at [phone] option 4.     Si Usted Necesita Algo Despus de Su Visita  Tambin puede enviarnos un mensaje a travs de MyChart. Por lo general respondemos a los mensajes de MyChart en el transcurso de 1 a 2  das hbiles.  Para renovar recetas, por favor pida a su farmacia que se ponga en contacto con nuestra oficina. Nuestro nmero de fax es el [phone].  Si tiene un asunto urgente cuando la clnica est cerrada y que no puede esperar hasta el siguiente da hbil, puede llamar/localizar a su doctor(a) al nmero que aparece a continuacin.   Por favor, tenga en cuenta que aunque hacemos todo lo posible para estar disponibles para asuntos urgentes fuera del horario de oficina, no estamos disponibles las 24 horas del da, los 7 das de la semana.   Si tiene un problema urgente y no puede comunicarse con nosotros, puede optar por buscar atencin mdica  en el consultorio de su doctor(a), en una clnica privada, en un centro de atencin urgente o en una sala de emergencias.  Si tiene una emergencia mdica, por favor llame inmediatamente al 911 o vaya a la sala de emergencias.  Nmeros de bper  - Dr. Kowalski: [phone]  - Dra. Moye: [phone]  - Dra. Stewart: [phone]  En caso de inclemencias del tiempo, por favor llame a nuestra lnea principal al [phone] para una actualizacin sobre el estado de cualquier retraso o cierre.  Consejos para la medicacin en dermatologa: Por favor, guarde las cajas en las que vienen los medicamentos de uso tpico para ayudarle a seguir las instrucciones sobre dnde y cmo usarlos. Las farmacias generalmente imprimen las instrucciones del medicamento slo en las cajas y no directamente en los tubos del medicamento.   Si su medicamento es muy caro, por favor, pngase en contacto con nuestra oficina llamando al [phone] y presione la opcin 4 o envenos un mensaje a travs de MyChart.   No podemos decirle cul ser su copago por los medicamentos por adelantado ya que esto es diferente dependiendo de la cobertura de su seguro. Sin embargo, es posible que podamos encontrar un medicamento sustituto a menor costo o llenar un formulario para que el  seguro cubra el medicamento que se considera necesario.   Si se requiere una autorizacin previa para que su compaa de seguros cubra su medicamento, por favor permtanos de 1 a 2 das hbiles para completar este proceso.  Los precios de los medicamentos varan con frecuencia dependiendo del lugar de dnde se surte la receta y alguna farmacias pueden ofrecer precios ms baratos.  El sitio web www.goodrx.com tiene cupones para medicamentos de diferentes farmacias. Los precios aqu no tienen en cuenta lo que podra costar con la ayuda del seguro (puede ser ms barato con su seguro), pero el sitio web puede darle el precio si no utiliz ningn seguro.  - Puede imprimir el cupn correspondiente y llevarlo con su receta a la farmacia.  - Tambin puede pasar por nuestra oficina durante el horario de atencin regular y recoger una tarjeta de cupones de GoodRx.  - Si necesita que su receta se enve electrnicamente a una farmacia diferente, informe a nuestra oficina a travs de MyChart de [place]   o por telfono llamando al [phone] y presione la opcin 4.  

## 2022-09-13 NOTE — Progress Notes (Signed)
   Follow-Up Visit   Subjective  Frank Hamilton is a 81 y.o. male who presents for the following: UBSE (Hx Aks, ISKs - irritated skin lesions on the back and forehead that patient would like checked today). The patient presents for Upper Body Skin Exam (UBSE) for skin cancer screening and mole check.  The patient has spots, moles and lesions to be evaluated, some may be new or changing and the patient has concerns that these could be cancer.   The following portions of the chart were reviewed this encounter and updated as appropriate:   Tobacco  Allergies  Meds  Problems  Med Hx  Surg Hx  Fam Hx     Review of Systems:  No other skin or systemic complaints except as noted in HPI or Assessment and Plan.  Objective  Well appearing patient in no apparent distress; mood and affect are within normal limits.  A full examination was performed including scalp, head, eyes, ears, nose, lips, neck, chest, axillae, abdomen, back, buttocks, bilateral upper extremities, bilateral lower extremities, hands, feet, fingers, toes, fingernails, and toenails. All findings within normal limits unless otherwise noted below.  R axilla x 1 Erythematous stuck-on, waxy papule or plaque. R axilla 2.2 cm brown papule.   Assessment & Plan  Inflamed seborrheic keratosis R axilla x 1 Patient defers treatment at this time.  Lentigines - Scattered tan macules - Due to sun exposure - Benign-appearing, observe - Recommend daily broad spectrum sunscreen SPF 30+ to sun-exposed areas, reapply every 2 hours as needed. - Call for any changes  Seborrheic Keratoses - Stuck-on, waxy, tan-brown papules and/or plaques  - Benign-appearing - Discussed benign etiology and prognosis. - Observe - Call for any changes  Melanocytic Nevi - Tan-brown and/or pink-flesh-colored symmetric macules and papules - Benign appearing on exam today - Observation - Call clinic for new or changing moles - Recommend daily use of  broad spectrum spf 30+ sunscreen to sun-exposed areas.   Hemangiomas - Red papules - Discussed benign nature - Observe - Call for any changes  Actinic Damage - Chronic condition, secondary to cumulative UV/sun exposure - diffuse scaly erythematous macules with underlying dyspigmentation - Recommend daily broad spectrum sunscreen SPF 30+ to sun-exposed areas, reapply every 2 hours as needed.  - Staying in the shade or wearing long sleeves, sun glasses (UVA+UVB protection) and wide brim hats (4-inch brim around the entire circumference of the hat) are also recommended for sun protection.  - Call for new or changing lesions.  Acrochordons (Skin Tags) - Fleshy, skin-colored pedunculated papules - Benign appearing.  - Observe. - If desired, they can be removed with an in office procedure that is not covered by insurance. - Please call the clinic if you notice any new or changing lesions.  Skin cancer screening performed today.  Return in about 1 year (around 09/14/2023) for TBSE.  Luther Redo, CMA, am acting as scribe for Sarina Ser, MD . Documentation: I have reviewed the above documentation for accuracy and completeness, and I agree with the above.  Sarina Ser, MD

## 2022-09-29 ENCOUNTER — Encounter: Payer: Self-pay | Admitting: Dermatology

## 2022-11-23 DIAGNOSIS — K080 Exfoliation of teeth due to systemic causes: Secondary | ICD-10-CM | POA: Diagnosis not present

## 2022-11-30 DIAGNOSIS — K080 Exfoliation of teeth due to systemic causes: Secondary | ICD-10-CM | POA: Diagnosis not present

## 2023-01-19 ENCOUNTER — Other Ambulatory Visit: Payer: Self-pay

## 2023-01-19 ENCOUNTER — Ambulatory Visit (INDEPENDENT_AMBULATORY_CARE_PROVIDER_SITE_OTHER): Payer: Medicare Other

## 2023-01-19 VITALS — BP 118/66 | Ht 71.0 in | Wt 204.6 lb

## 2023-01-19 DIAGNOSIS — Z0000 Encounter for general adult medical examination without abnormal findings: Secondary | ICD-10-CM | POA: Diagnosis not present

## 2023-01-19 NOTE — Patient Instructions (Signed)
Frank Hamilton , Thank you for taking time to come for your Medicare Wellness Visit. I appreciate your ongoing commitment to your health goals. Please review the following plan we discussed and let me know if I can assist you in the future.   These are the goals we discussed:  Goals      Patient Stated     Patient states he would like to remain healthy and continue to improve gait, balance and strength in legs.     Weight (lb) < 200 lb (90.7 kg)     Pt states he would like to lose 10-15 pounds over the next year with healthy eating and physical activity         This is a list of the screening recommended for you and due dates:  Health Maintenance  Topic Date Due   Zoster (Shingles) Vaccine (1 of 2) Never done   COVID-19 Vaccine (4 - 2023-24 season) 07/14/2022   Medicare Annual Wellness Visit  01/19/2024   DTaP/Tdap/Td vaccine (3 - Td or Tdap) 09/10/2031   Pneumonia Vaccine  Completed   Flu Shot  Completed   Hepatitis C Screening: USPSTF Recommendation to screen - Ages 18-79 yo.  Completed   HPV Vaccine  Aged Out    Advanced directives: no  Conditions/risks identified: falls risk  Next appointment: Follow up in one year for your annual wellness visit. 01/25/2024 '@11am'$  in person  Preventive Care 65 Years and Older, Male  Preventive care refers to lifestyle choices and visits with your health care provider that can promote health and wellness. What does preventive care include? A yearly physical exam. This is also called an annual well check. Dental exams once or twice a year. Routine eye exams. Ask your health care provider how often you should have your eyes checked. Personal lifestyle choices, including: Daily care of your teeth and gums. Regular physical activity. Eating a healthy diet. Avoiding tobacco and drug use. Limiting alcohol use. Practicing safe sex. Taking low doses of aspirin every day. Taking vitamin and mineral supplements as recommended by your health  care provider. What happens during an annual well check? The services and screenings done by your health care provider during your annual well check will depend on your age, overall health, lifestyle risk factors, and family history of disease. Counseling  Your health care provider may ask you questions about your: Alcohol use. Tobacco use. Drug use. Emotional well-being. Home and relationship well-being. Sexual activity. Eating habits. History of falls. Memory and ability to understand (cognition). Work and work Statistician. Screening  You may have the following tests or measurements: Height, weight, and BMI. Blood pressure. Lipid and cholesterol levels. These may be checked every 5 years, or more frequently if you are over 70 years old. Skin check. Lung cancer screening. You may have this screening every year starting at age 78 if you have a 30-pack-year history of smoking and currently smoke or have quit within the past 15 years. Fecal occult blood test (FOBT) of the stool. You may have this test every year starting at age 93. Flexible sigmoidoscopy or colonoscopy. You may have a sigmoidoscopy every 5 years or a colonoscopy every 10 years starting at age 63. Prostate cancer screening. Recommendations will vary depending on your family history and other risks. Hepatitis C blood test. Hepatitis B blood test. Sexually transmitted disease (STD) testing. Diabetes screening. This is done by checking your blood sugar (glucose) after you have not eaten for a while (fasting). You may  have this done every 1-3 years. Abdominal aortic aneurysm (AAA) screening. You may need this if you are a current or former smoker. Osteoporosis. You may be screened starting at age 38 if you are at high risk. Talk with your health care provider about your test results, treatment options, and if necessary, the need for more tests. Vaccines  Your health care provider may recommend certain vaccines, such  as: Influenza vaccine. This is recommended every year. Tetanus, diphtheria, and acellular pertussis (Tdap, Td) vaccine. You may need a Td booster every 10 years. Zoster vaccine. You may need this after age 86. Pneumococcal 13-valent conjugate (PCV13) vaccine. One dose is recommended after age 60. Pneumococcal polysaccharide (PPSV23) vaccine. One dose is recommended after age 91. Talk to your health care provider about which screenings and vaccines you need and how often you need them. This information is not intended to replace advice given to you by your health care provider. Make sure you discuss any questions you have with your health care provider. Document Released: 11/26/2015 Document Revised: 07/19/2016 Document Reviewed: 08/31/2015 Elsevier Interactive Patient Education  2017 [place] Prevention in the Home Falls can cause injuries. They can happen to people of all ages. There are many things you can do to make your home safe and to help prevent falls. What can I do on the outside of my home? Regularly fix the edges of walkways and driveways and fix any cracks. Remove anything that might make you trip as you walk through a door, such as a raised step or threshold. Trim any bushes or trees on the path to your home. Use bright outdoor lighting. Clear any walking paths of anything that might make someone trip, such as rocks or tools. Regularly check to see if handrails are loose or broken. Make sure that both sides of any steps have handrails. Any raised decks and porches should have guardrails on the edges. Have any leaves, snow, or ice cleared regularly. Use sand or salt on walking paths during winter. Clean up any spills in your garage right away. This includes oil or grease spills. What can I do in the bathroom? Use night lights. Install grab bars by the toilet and in the tub and shower. Do not use towel bars as grab bars. Use non-skid mats or decals in the tub or  shower. If you need to sit down in the shower, use a plastic, non-slip stool. Keep the floor dry. Clean up any water that spills on the floor as soon as it happens. Remove soap buildup in the tub or shower regularly. Attach bath mats securely with double-sided non-slip rug tape. Do not have throw rugs and other things on the floor that can make you trip. What can I do in the bedroom? Use night lights. Make sure that you have a light by your bed that is easy to reach. Do not use any sheets or blankets that are too big for your bed. They should not hang down onto the floor. Have a firm chair that has side arms. You can use this for support while you get dressed. Do not have throw rugs and other things on the floor that can make you trip. What can I do in the kitchen? Clean up any spills right away. Avoid walking on wet floors. Keep items that you use a lot in easy-to-reach places. If you need to reach something above you, use a strong step stool that has a grab bar. Keep electrical cords  out of the way. Do not use floor polish or wax that makes floors slippery. If you must use wax, use non-skid floor wax. Do not have throw rugs and other things on the floor that can make you trip. What can I do with my stairs? Do not leave any items on the stairs. Make sure that there are handrails on both sides of the stairs and use them. Fix handrails that are broken or loose. Make sure that handrails are as long as the stairways. Check any carpeting to make sure that it is firmly attached to the stairs. Fix any carpet that is loose or worn. Avoid having throw rugs at the top or bottom of the stairs. If you do have throw rugs, attach them to the floor with carpet tape. Make sure that you have a light switch at the top of the stairs and the bottom of the stairs. If you do not have them, ask someone to add them for you. What else can I do to help prevent falls? Wear shoes that: Do not have high heels. Have  rubber bottoms. Are comfortable and fit you well. Are closed at the toe. Do not wear sandals. If you use a stepladder: Make sure that it is fully opened. Do not climb a closed stepladder. Make sure that both sides of the stepladder are locked into place. Ask someone to hold it for you, if possible. Clearly mark and make sure that you can see: Any grab bars or handrails. First and last steps. Where the edge of each step is. Use tools that help you move around (mobility aids) if they are needed. These include: Canes. Walkers. Scooters. Crutches. Turn on the lights when you go into a dark area. Replace any light bulbs as soon as they burn out. Set up your furniture so you have a clear path. Avoid moving your furniture around. If any of your floors are uneven, fix them. If there are any pets around you, be aware of where they are. Review your medicines with your doctor. Some medicines can make you feel dizzy. This can increase your chance of falling. Ask your doctor what other things that you can do to help prevent falls. This information is not intended to replace advice given to you by your health care provider. Make sure you discuss any questions you have with your health care provider. Document Released: 08/26/2009 Document Revised: 04/06/2016 Document Reviewed: 12/04/2014 Elsevier Interactive Patient Education  2017 [employer].

## 2023-01-19 NOTE — Progress Notes (Signed)
Subjective:   Frank Hamilton is a 82 y.o. male who presents for Medicare Annual/Subsequent preventive examination.  Review of Systems    Cardiac Risk Factors include: advanced age (>2mn, >>26women);dyslipidemia;hypertension;male gender;sedentary lifestyle     Objective:    Today's Vitals   01/19/23 1020  BP: 118/66  Weight: 204 lb 9.6 oz (92.8 kg)  Height: '5\' 11"'$  (1.803 m)  PainSc: 3    Body mass index is 28.54 kg/m.     01/19/2023   10:36 AM 01/17/2022   11:13 AM 02/10/2021   10:17 AM 01/11/2021   11:55 AM 01/09/2020   11:34 AM 01/02/2019   11:50 AM 04/17/2018    9:33 AM  Advanced Directives  Does Patient Have a Medical Advance Directive? No No No No No No No  Would patient like information on creating a medical advance directive?  No - Patient declined No - Patient declined No - Patient declined Yes (MAU/Ambulatory/Procedural Areas - Information given) No - Patient declined No - Patient declined    Current Medications (verified) Outpatient Encounter Medications as of 01/19/2023  Medication Sig   amLODipine (NORVASC) 10 MG tablet Take 1 tablet (10 mg total) by mouth daily.   aspirin 81 MG chewable tablet Chew 81 mg by mouth.   atorvastatin (LIPITOR) 40 MG tablet Take 1 tablet (40 mg total) by mouth daily.   Cholecalciferol (VITAMIN D) 2000 units CAPS Take 1 capsule (2,000 Units total) by mouth daily.   glucosamine-chondroitin 500-400 MG tablet Take 1 tablet by mouth in the morning and at bedtime.    latanoprost (XALATAN) 0.005 % ophthalmic solution SMARTSIG:1 Drop(s) In Eye(s) Every Evening   omega-3 acid ethyl esters (LOVAZA) 1 g capsule Take 2 capsules (2 g total) by mouth 2 (two) times daily.   pregabalin (LYRICA) 50 MG capsule Take 1 capsule (50 mg total) by mouth at bedtime.   valsartan-hydrochlorothiazide (DIOVAN-HCT) 320-12.5 MG tablet Take 1 tablet by mouth daily.   hydrochlorothiazide (MICROZIDE) 12.5 MG capsule Take 1 capsule (12.5 mg total) by mouth daily. (Patient  not taking: Reported on 01/19/2023)   No facility-administered encounter medications on file as of 01/19/2023.    Allergies (verified) Gabapentin and Sulfa antibiotics   History: Past Medical History:  Diagnosis Date   Hyperlipidemia    Hypertension    Kidney cysts    Osteoarthritis    PVD (peripheral vascular disease) ([place]    Vitamin D deficiency    Past Surgical History:  Procedure Laterality Date   bowel obstruction  2011   CATARACT EXTRACTION, BILATERAL  2017   HERNIA REPAIR  2009 and 2010   TONSILLECTOMY     age 89or [id number]  Family History  Problem Relation Age of Onset   Heart disease Mother    Stroke Father    Heart disease Sister    Diabetes Sister    Diabetes Maternal Grandfather    Social History   Socioeconomic History   Marital status: Widowed    Spouse name: Not on file   Number of children: 3   Years of education: Not on file   Highest education level: High school graduate  Occupational History   Occupation: retired  Tobacco Use   Smoking status: Former    Packs/day: 1.00    Years: 20.00    Total pack years: 20.00    Types: Cigarettes    Quit date: 06/08/1974    Years since quitting: 48.6   Smokeless tobacco: Never  Vaping Use  Vaping Use: Never used  Substance and Sexual Activity   Alcohol use: Yes    Alcohol/week: 1.0 standard drink of alcohol    Types: 1 Cans of beer per week    Comment: social   Drug use: No   Sexual activity: Not Currently  Other Topics Concern   Not on file  Social History Narrative   He was married for 31 years , wife died in January 30, 2016. Lives with his dog   Social Determinants of Health   Financial Resource Strain: Low Risk  (01/19/2023)   Overall Financial Resource Strain (CARDIA)    Difficulty of Paying Living Expenses: Not hard at all  Food Insecurity: No Food Insecurity (01/19/2023)   Hunger Vital Sign    Worried About Running Out of Food in the Last Year: Never true    Ran Out of Food in the Last Year: Never true   Transportation Needs: No Transportation Needs (01/19/2023)   PRAPARE - Hydrologist (Medical): No    Lack of Transportation (Non-Medical): No  Physical Activity: Inactive (01/19/2023)   Exercise Vital Sign    Days of Exercise per Week: 0 days    Minutes of Exercise per Session: 0 min  Stress: No Stress Concern Present (01/19/2023)   [place]    Feeling of Stress : Not at all  Social Connections: Socially Isolated (01/19/2023)   Social Connection and Isolation Panel [NHANES]    Frequency of Communication with Friends and Family: More than three times a week    Frequency of Social Gatherings with Friends and Family: Twice a week    Attends Religious Services: Never    Marine scientist or Organizations: No    Attends Archivist Meetings: Never    Marital Status: Widowed    Tobacco Counseling Counseling given: Not Answered   Clinical Intake:  Pre-visit preparation completed: Yes  Pain : 0-10 Pain Score: 3  Pain Type: Acute pain Pain Location: Hip Pain Orientation: Right Pain Descriptors / Indicators: Aching, Nagging, Spasm Pain Onset: 1 to 4 weeks ago Pain Frequency: Intermittent Pain Relieving Factors: icy hot, tylenol  Pain Relieving Factors: icy hot, tylenol  BMI - recorded: 28.54 Nutritional Status: BMI 25 -29 Overweight Nutritional Risks: None Diabetes: No  How often do you need to have someone help you when you read instructions, pamphlets, or other written materials from your doctor or pharmacy?: 1 - Never  Diabetic?no  Interpreter Needed?: No  Information entered by :: B.Glenden Rossell,LPN   Activities of Daily Living    01/19/2023   10:36 AM 09/08/2022   11:11 AM  In your present state of health, do you have any difficulty performing the following activities:  Hearing? 1 0  Vision? 0 0  Difficulty concentrating or making decisions? 1 0  Walking or  climbing stairs? 1 1  Dressing or bathing? 0 0  Doing errands, shopping? 0 0  Preparing Food and eating ? N   Using the Toilet? N   In the past six months, have you accidently leaked urine? N   Do you have problems with loss of bowel control? N   Managing your Medications? N   Managing your Finances? N   Housekeeping or managing your Housekeeping? N     Patient Care Team: Steele Sizer, MD as PCP - General (Family Medicine) Kate Sable, MD as PCP - Cardiology (Cardiology) Lavonia Dana, MD as Consulting Physician (Nephrology) Sarina Ser  C, MD (Dermatology)  Indicate any recent Medical Services you may have received from other than [facility] providers in the past year (date may be approximate).     Assessment:   This is a routine wellness examination for Enneth.  Hearing/Vision screen Hearing Screening - Comments:: Not good hearing:has hearing aids but does not wear Vision Screening - Comments:: Adequate w/glasses [facility]  Dietary issues and exercise activities discussed: Current Exercise Habits: The patient does not participate in regular exercise at present   Goals Addressed             This Visit's Progress    Patient Stated   On track    Patient states he would like to remain healthy and continue to improve gait, balance and strength in legs.       Depression Screen    01/19/2023   10:31 AM 09/08/2022   11:10 AM 03/10/2022   11:03 AM 01/17/2022   11:11 AM 11/21/2021    2:19 PM 09/09/2021   10:57 AM 03/09/2021   10:47 AM  PHQ 2/9 Scores  PHQ - 2 Score 0 0 0 0 0 0 0  PHQ- 9 Score  0 2 2 0      Fall Risk    01/19/2023   10:25 AM 09/08/2022   11:07 AM 03/10/2022   11:02 AM 01/17/2022   11:17 AM 11/21/2021    2:19 PM  Fall Risk   Falls in the past year? 0 0 0 0 0  Number falls in past yr: 0   0 0  Injury with Fall? 0   0 0  Risk for fall due to : No Fall Risks  No Fall Risks No Fall Risks No Fall Risks  Follow up Falls prevention  discussed;Education provided  Falls prevention discussed Falls prevention discussed Falls prevention discussed    FALL RISK PREVENTION PERTAINING TO THE HOME:  Any stairs in or around the home? Yes  If so, are there any without handrails? Yes  Home free of loose throw rugs in walkways, pet beds, electrical cords, etc? Yes  Adequate lighting in your home to reduce risk of falls? Yes   ASSISTIVE DEVICES UTILIZED TO PREVENT FALLS:  Life alert? No  Use of a cane, walker or w/c? Yes walking stick Grab bars in the bathroom? Yes  Shower chair or bench in shower? Yes  Elevated toilet seat or a handicapped toilet? Yes   TIMED UP AND GO:  Was the test performed? Yes .  Length of time to ambulate 10 feet: 16 sec.   Gait slow and steady without use of assistive device  Cognitive Function:        01/19/2023   10:40 AM 01/09/2020   11:39 AM 01/02/2019   11:58 AM  6CIT Screen  What Year? 0 points 0 points 0 points  What month? 0 points 0 points 0 points  What time? 0 points 0 points 0 points  Count back from 20 0 points 0 points 0 points  Months in reverse 0 points 0 points 0 points  Repeat phrase 0 points 0 points 0 points  Total Score 0 points 0 points 0 points    Immunizations Immunization History  Administered Date(s) Administered   Fluad Quad(high Dose 65+) 09/08/2019, 09/08/2022   Influenza, High Dose Seasonal PF 11/15/2016, 09/27/2017, 10/04/2018, 11/15/2020, 09/09/2021   Influenza,inj,Quad PF,6+ Mos 08/03/2015   Influenza-Unspecified 09/13/2013   PFIZER(Purple Top)SARS-COV-2 Vaccination 12/18/2019, 01/08/2020, 09/25/2020   PNEUMOCOCCAL CONJUGATE-20 01/17/2022   Pneumococcal Conjugate-13  04/01/2018   Tdap 08/15/2011, 09/09/2021    TDAP status: Up to date  Flu Vaccine status: Up to date  Pneumococcal vaccine status: Up to date  Covid-19 vaccine status: Completed vaccines  Qualifies for Shingles Vaccine? Yes   Zostavax completed No   Shingrix Completed?: No.     Education has been provided regarding the importance of this vaccine. Patient has been advised to call insurance company to determine out of pocket expense if they have not yet received this vaccine. Advised may also receive vaccine at local pharmacy or Health Dept. Verbalized acceptance and understanding.  Screening Tests Health Maintenance  Topic Date Due   Zoster Vaccines- Shingrix (1 of 2) Never done   COVID-19 Vaccine (4 - 2023-24 season) 07/14/2022   Medicare Annual Wellness (AWV)  01/19/2024   DTaP/Tdap/Td (3 - Td or Tdap) 09/10/2031   Pneumonia Vaccine 71+ Years old  Completed   INFLUENZA VACCINE  Completed   Hepatitis C Screening  Completed   HPV VACCINES  Aged Out    Health Maintenance  Health Maintenance Due  Topic Date Due   Zoster Vaccines- Shingrix (1 of 2) Never done   COVID-19 Vaccine (4 - 2023-24 season) 07/14/2022    Colorectal cancer screening: No longer required.   Lung Cancer Screening: (Low Dose CT Chest recommended if Age 64-80 years, 30 pack-year currently smoking OR have quit w/in 15years.) does not qualify.   Lung Cancer Screening Referral: no  Additional Screening:  Hepatitis C Screening: does not qualify; Completed no  Vision Screening: Recommended annual ophthalmology exams for early detection of glaucoma and other disorders of the eye. Is the patient up to date with their annual eye exam?  Yes  Who is the provider or what is the name of the office in which the patient attends annual eye exams? [facility] If pt is not established with a provider, would they like to be referred to a provider to establish care? No .   Dental Screening: Recommended annual dental exams for proper oral hygiene  Community Resource Referral / Chronic Care Management: CRR required this visit?  No   CCM required this visit?  No      Plan:     I have personally reviewed and noted the following in the patient's chart:   Medical and social history Use of alcohol,  tobacco or illicit drugs  Current medications and supplements including opioid prescriptions. Patient is not currently taking opioid prescriptions. Functional ability and status Nutritional status Physical activity Advanced directives List of other physicians Hospitalizations, surgeries, and ER visits in previous 12 months Vitals Screenings to include cognitive, depression, and falls Referrals and appointments  In addition, I have reviewed and discussed with patient certain preventive protocols, quality metrics, and best practice recommendations. A written personalized care plan for preventive services as well as general preventive health recommendations were provided to patient.     Roger Shelter, LPN   [id number]   Nurse Notes: pt  states he is experiencing rt hip pain for two weeks after being out with son chopping wood. He relays it has been getting better with icy hot and Tylenol. Pt encouraged to call for appt if worsens and be careful walking. Pt is was walking with limb and unsteady a couple of times (says hip catching at times). Pt says he has a walking stick. I encouraged pt to use walking stick for for stability and to call us if his condition worsens (pt declined appt for this issue)  *  Pt does indicate he is not taking the HCTZ; it is "double dose"  as it is in the Diovan. Pt wants to consult with provider before taking it.*

## 2023-01-23 ENCOUNTER — Ambulatory Visit: Payer: Medicare Other

## 2023-02-21 DIAGNOSIS — H40052 Ocular hypertension, left eye: Secondary | ICD-10-CM | POA: Diagnosis not present

## 2023-02-21 DIAGNOSIS — H401111 Primary open-angle glaucoma, right eye, mild stage: Secondary | ICD-10-CM | POA: Diagnosis not present

## 2023-04-03 DIAGNOSIS — R809 Proteinuria, unspecified: Secondary | ICD-10-CM | POA: Diagnosis not present

## 2023-04-03 DIAGNOSIS — R609 Edema, unspecified: Secondary | ICD-10-CM | POA: Diagnosis not present

## 2023-04-03 DIAGNOSIS — I10 Essential (primary) hypertension: Secondary | ICD-10-CM | POA: Diagnosis not present

## 2023-04-25 ENCOUNTER — Other Ambulatory Visit: Payer: Self-pay | Admitting: Family Medicine

## 2023-04-25 DIAGNOSIS — E785 Hyperlipidemia, unspecified: Secondary | ICD-10-CM

## 2023-05-03 ENCOUNTER — Other Ambulatory Visit: Payer: Self-pay | Admitting: Family Medicine

## 2023-05-03 DIAGNOSIS — I10 Essential (primary) hypertension: Secondary | ICD-10-CM

## 2023-05-03 DIAGNOSIS — E785 Hyperlipidemia, unspecified: Secondary | ICD-10-CM

## 2023-05-03 DIAGNOSIS — G8929 Other chronic pain: Secondary | ICD-10-CM

## 2023-05-07 ENCOUNTER — Other Ambulatory Visit: Payer: Self-pay | Admitting: Family Medicine

## 2023-05-07 DIAGNOSIS — E785 Hyperlipidemia, unspecified: Secondary | ICD-10-CM

## 2023-05-07 DIAGNOSIS — G8929 Other chronic pain: Secondary | ICD-10-CM

## 2023-05-07 DIAGNOSIS — I10 Essential (primary) hypertension: Secondary | ICD-10-CM

## 2023-05-09 ENCOUNTER — Other Ambulatory Visit: Payer: Self-pay | Admitting: Family Medicine

## 2023-05-09 DIAGNOSIS — G8929 Other chronic pain: Secondary | ICD-10-CM

## 2023-05-09 DIAGNOSIS — E785 Hyperlipidemia, unspecified: Secondary | ICD-10-CM

## 2023-05-09 NOTE — Telephone Encounter (Signed)
Medication Refill - Medication:   atorvastatin (LIPITOR) 40 MG tablet [Pharmacy Med Name: Atorvastatin Calcium 40 MG Oral Tablet]  and  pregabalin (LYRICA) 50 MG capsule    Has the patient contacted their pharmacy? Yes.   (Preferred Pharmacy (with phone number or street name):  [facility] [address], [place] - [address] [place] [address] Phone: [phone]  Fax: [phone] DEA #: --  DAW Reason: --     Has the patient been seen for an appointment in the last year OR does the patient have an upcoming appointment? Yes.    Agent: Please be advised that RX refills may take up to 3 business days. We ask that you follow-up with your pharmacy.

## 2023-05-10 MED ORDER — PREGABALIN 50 MG PO CAPS
50.0000 mg | ORAL_CAPSULE | Freq: Every day | ORAL | 0 refills | Status: DC
Start: 2023-05-10 — End: 2023-06-13

## 2023-05-10 MED ORDER — ATORVASTATIN CALCIUM 40 MG PO TABS: 40.0000 mg | ORAL_TABLET | Freq: Every day | ORAL | 0 refills | Status: DC

## 2023-05-10 NOTE — Telephone Encounter (Signed)
Requested medication (s) are due for refill today:   Provider to review   Requested medication (s) are on the active medication list:   Yes for both  Future visit scheduled:   Yes 06/13/2023   Last ordered: Atorvastatin 09/08/2022 #90, 1 refill;   Lyrica 09/09/2023 #90, 1 refill  Returned because pt needing enough to last until appt on 7/31.   Also Lyrica is non delegated.      Requested Prescriptions  Pending Prescriptions Disp Refills   atorvastatin (LIPITOR) 40 MG tablet 90 tablet 1    Sig: Take 1 tablet (40 mg total) by mouth daily.     Cardiovascular:  Antilipid - Statins Failed - 05/09/2023  2:06 PM      Failed - Lipid Panel in normal range within the last 12 months    Cholesterol, Total  Date Value Ref Range Status  08/04/2015 197 100 - 199 mg/dL Final   Cholesterol  Date Value Ref Range Status  03/10/2022 111 <200 mg/dL Final   LDL Cholesterol (Calc)  Date Value Ref Range Status  03/10/2022 48 mg/dL (calc) Final    Comment:    Reference range: <100 . Desirable range <100 mg/dL for primary prevention;   <70 mg/dL for patients with CHD or diabetic patients  with > or = 2 CHD risk factors. Marland Kitchen LDL-C is now calculated using the Martin-Hopkins  calculation, which is a validated novel method providing  better accuracy than the Friedewald equation in the  estimation of LDL-C.  Horald Pollen et al. Lenox Ahr. 1478;295(62): 2061-2068  (http://education.[facility].com/faq/FAQ164)    HDL  Date Value Ref Range Status  03/10/2022 26 (L) > OR = 40 mg/dL Final  13/06/6577 29 (L) >39 mg/dL Final    Comment:    According to ATP-III Guidelines, HDL-C >59 mg/dL is considered a negative risk factor for CHD.    Triglycerides  Date Value Ref Range Status  03/10/2022 348 (H) <150 mg/dL Final    Comment:    . If a non-fasting specimen was collected, consider repeat triglyceride testing on a fasting specimen if clinically indicated.  Perry Mount et al. J. of Clin. Lipidol.  2015;9:129-169. Marland Kitchen          Passed - Patient is not pregnant      Passed - Valid encounter within last 12 months    Recent Outpatient Visits           8 months ago Chronic radicular lumbar pain (RIGHT)   [place] [facility] Alba Cory, MD   1 year ago Essential hypertension   [place] Alba Cory, MD   1 year ago Viral illness   [facility] Alba Cory, MD   1 year ago Senile purpura [facility])   [place] [facility] Alba Cory, MD   2 years ago PVD (peripheral vascular disease) with claudication [facility])   [place] [facility] Alba Cory, MD       Future Appointments             In 1 month Alba Cory, MD [facility]             pregabalin (LYRICA) 50 MG capsule 90 capsule 1    Sig: Take 1 capsule (50 mg total) by mouth at bedtime.     Not Delegated - Neurology:  Anticonvulsants - Controlled - pregabalin Failed - 05/09/2023  2:06 PM      Failed - This  refill cannot be delegated      Failed - Cr in normal range and within 360 days    Creat  Date Value Ref Range Status  03/10/2022 0.80 0.70 - 1.28 mg/dL Final   Creatinine, Urine  Date Value Ref Range Status  11/15/2020 82 20 - 320 mg/dL Final         Passed - Completed PHQ-2 or PHQ-9 in the last 360 days      Passed - Valid encounter within last 12 months    Recent Outpatient Visits           8 months ago Chronic radicular lumbar pain (RIGHT)   [place] [facility] Alba Cory, MD   1 year ago Essential hypertension   [place] [facility] Alba Cory, MD   1 year ago Viral illness   [facility] Alba Cory, MD   1 year ago Senile purpura [facility])   [facility] [facility] Alba Cory, MD   2 years ago PVD (peripheral vascular disease) with claudication  [facility])   [facility] Alba Cory, MD       Future Appointments             In 1 month Carlynn Purl, Danna Hefty, MD [facility]

## 2023-06-11 ENCOUNTER — Other Ambulatory Visit: Payer: Self-pay | Admitting: Family Medicine

## 2023-06-11 DIAGNOSIS — R809 Proteinuria, unspecified: Secondary | ICD-10-CM

## 2023-06-11 DIAGNOSIS — I10 Essential (primary) hypertension: Secondary | ICD-10-CM

## 2023-06-11 NOTE — Telephone Encounter (Signed)
Spoke with pt and he said he is completely out of both meds

## 2023-06-12 NOTE — Progress Notes (Signed)
Name: Frank Hamilton   MRN: [id number]    DOB: 1941/06/09   Date:06/13/2023       Progress Note  Subjective  Chief Complaint  Medication Refill  HPI  HTN: taking medication and denies side effects, no chest pain or palpitation He is  on ARB, HCTZ and Norvasc . We adjusted dose of valsartan hctz to 320/12.5 mg Spring of 2023 and his bp has been controlled since,  he denies orthostatic changes.   Exposure to Agent Orange: A1C done 02/2022 was 6 % and we will recheck it today. He denies polyphagia, polydipsia or polyuria   Dyslipidemia: Atorvastatin and lovaza , reviewed last labs done 04/20233 we will recheck labs today    GERD: symptoms stable takes Tums prn   Low back pain: he used to see  Dr. Cherylann Ratel, he has MRI ( below ), he states since started on Lyrica that he only takes prn due to somnolence the following day, we will adjust dose to 25 mg and may take one at dinner and one before bedtime to see if side effects decreases.  He  has leg fatigue when walking, explained that is likely from PVD and not the radiculitis.  No bowel or bladder incontinence.  He has not been back to see him since 2022    IMPRESSION: 1. L3-4: Disc herniation more prominent towards the right with caudal migration down behind L4. Facet and ligamentous hypertrophy. Severe multifactorial stenosis at this level, particularly on the right. Neural compression seems certain at this level, particularly on the right affecting at least the L4 nerve. 2. L4-5: Severe multifactorial spinal stenosis that could cause neural compression on either or both sides. Foraminal stenosis right worse than left. 3. L2-3: Mild multifactorial stenosis, most prominent in the left lateral recess. Left foraminal stenosis that could affect the left L2 nerve. 4. L5-S1: Left foraminal stenosis because of facet osteophytes and bulging disc material could affect the exiting L5 nerve.    PVD with claudication: mildly abnormal ABI he had PT ,   he can walk down his driveway - 2/3 mile without having to stop. He takes statin therapy and aspirin 81 mg .we will recheck labs   Mild aortic stenosis: had echo done in 2018, denies chest pain, palpitation, dizziness, he went back to cardiologist and had repeat Echo 08/2022 and stable . Advised to keep regular follow ups with cardiologist   IMPRESSIONS Left ventricular ejection fraction, by estimation, is 60 to 65%. The left ventricle has normal function. The left ventricle has no regional wall motion abnormalities. There is moderate asymmetric left ventricular hypertrophy of the septal segment. Left ventricular diastolic parameters are consistent with Grade I diastolic dysfunction (impaired relaxation). The average left ventricular global longitudinal strain is -18.2 %. The global longitudinal strain is normal. 1. 2. Right ventricular systolic function is normal. The right ventricular size is normal. 3. The mitral valve is normal in structure. No evidence of mitral valve regurgitation. The aortic valve is tricuspid. Aortic valve regurgitation is mild. Mild aortic valve stenosis. Aortic valve mean gradient measures 19.0 mmHg. 4. The inferior vena cava is normal in size with greater than 50% respiratory variability, suggesting right atrial pressure of 3 mmHg.   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 Reassurance given   Shoulder pain: he states during winter months he was chopping wood and a few months ago he noticed pain with rom of left shoulder, no tenderness to touch or redness, a month or so later pain  on right shoulder. He has difficulty with abduction   Patient Active Problem List   Diagnosis Date Noted   Exposure to Agent [facility] 09/08/2022   Aortic stenosis, mild 09/08/2022   Bilateral hearing loss 09/08/2022   Tinnitus of both ears 09/08/2022   Spinal stenosis, lumbar region, with neurogenic claudication 02/10/2021   Chronic radicular lumbar pain 02/10/2021   Lumbar  spondylosis 02/10/2021   DDD (degenerative disc disease), lumbar 02/10/2021   Chronic pain syndrome 02/10/2021   Positive for macroalbuminuria 08/06/2018   PVD (peripheral vascular disease) with claudication (HCC) 07/02/2018   Senile purpura (HCC) 07/02/2018   Claudication (HCC) 04/23/2018   Vitamin D deficiency 05/02/2016   Osteoarthritis of both hands 05/01/2016   Essential hypertension 08/03/2015   Dyslipidemia 08/03/2015   Kidney cysts 12/15/2013    Past Surgical History:  Procedure Laterality Date   bowel obstruction  2011   CATARACT EXTRACTION, BILATERAL  2017   HERNIA REPAIR  2009 and 2010   TONSILLECTOMY     age 49 or 37    Family History  Problem Relation Age of Onset   Heart disease Mother    Stroke Father    Heart disease Sister    Diabetes Sister    Diabetes Maternal Grandfather     Social History   Tobacco Use   Smoking status: Former    Current packs/day: 0.00    Average packs/day: 1 pack/day for 20.0 years (20.0 ttl pk-yrs)    Types: Cigarettes    Start date: 06/08/1954    Quit date: 06/08/1974    Years since quitting: 49.0   Smokeless tobacco: Never  Substance Use Topics   Alcohol use: Yes    Alcohol/week: 1.0 standard drink of alcohol    Types: 1 Cans of beer per week    Comment: social     Current Outpatient Medications:    amLODipine (NORVASC) 10 MG tablet, Take 1 tablet by mouth once daily, Disp: 90 tablet, Rfl: 0   aspirin 81 MG chewable tablet, Chew 81 mg by mouth., Disp: , Rfl:    Cholecalciferol (VITAMIN D) 2000 units CAPS, Take 1 capsule (2,000 Units total) by mouth daily., Disp: 30 capsule, Rfl:    glucosamine-chondroitin 500-400 MG tablet, Take 1 tablet by mouth in the morning and at bedtime. , Disp: , Rfl:    latanoprost (XALATAN) 0.005 % ophthalmic solution, SMARTSIG:1 Drop(s) In Eye(s) Every Evening, Disp: , Rfl:    pregabalin (LYRICA) 25 MG capsule, Take 1 capsule (25 mg total) by mouth 2 (two) times daily., Disp: 180 capsule, Rfl:  0   atorvastatin (LIPITOR) 40 MG tablet, Take 1 tablet (40 mg total) by mouth daily., Disp: 90 tablet, Rfl: 1   omega-3 acid ethyl esters (LOVAZA) 1 g capsule, Take 2 capsules (2 g total) by mouth 2 (two) times daily., Disp: 360 capsule, Rfl: 1   valsartan-hydrochlorothiazide (DIOVAN-HCT) 320-12.5 MG tablet, Take 1 tablet by mouth daily., Disp: 90 tablet, Rfl: 0  Allergies  Allergen Reactions   Gabapentin Rash    Headache also reported   Sulfa Antibiotics Itching and Rash    I personally reviewed active problem list, medication list, allergies, family history, social history, health maintenance with the patient/caregiver today.   ROS  Constitutional: Negative for fever or weight change.  Respiratory: Negative for cough and shortness of breath.   Cardiovascular: Negative for chest pain or palpitations.  Gastrointestinal: Negative for abdominal pain, no bowel changes.  Musculoskeletal: Negative for gait problem or joint swelling.  Skin: Negative for rash.  Neurological: Negative for dizziness or headache.  No other specific complaints in a complete review of systems (except as listed in HPI above).   Objective  Vitals:   06/13/23 1104  BP: 128/68  Pulse: 68  Resp: 16  Temp: 97.9 F (36.6 C)  TempSrc: Oral  SpO2: 96%  Weight: 205 lb 3.2 oz (93.1 kg)  Height: 5\' 11"  (1.803 m)    Body mass index is 28.62 kg/m.  Physical Exam  Constitutional: Patient appears well-developed and well-nourished.  No distress.  HEENT: head atraumatic, normocephalic, pupils equal and reactive to light, neck supple Cardiovascular: Normal rate, regular rhythm and normal heart sounds.  No murmur heard. No BLE edema. Pulmonary/Chest: Effort normal and breath sounds normal. No respiratory distress. Muscular Skeletal: decrease rom of both shoulders, no tenderness during palpation Abdominal: Soft.  There is no tenderness. Psychiatric: Patient has a normal mood and affect. behavior is normal.  Judgment and thought content normal.   PHQ2/9:    06/13/2023   11:00 AM 01/19/2023   10:31 AM 09/08/2022   11:10 AM 03/10/2022   11:03 AM 01/17/2022   11:11 AM  Depression screen PHQ 2/9  Decreased Interest 0 0 0 0 0  Down, Depressed, Hopeless 0 0 0 0 0  PHQ - 2 Score 0 0 0 0 0  Altered sleeping 0  0 1 1  Tired, decreased energy 0  0 1 1  Change in appetite 0  0 0 0  Feeling bad or failure about yourself  0  0 0 0  Trouble concentrating 0  0 0 0  Moving slowly or fidgety/restless 0  0 0 0  Suicidal thoughts 0  0 0 0  PHQ-9 Score 0  0 2 2  Difficult doing work/chores    Not difficult at all     phq 9 is negative   Fall Risk:    06/13/2023   11:00 AM 01/19/2023   10:25 AM 09/08/2022   11:07 AM 03/10/2022   11:02 AM 01/17/2022   11:17 AM  Fall Risk   Falls in the past year? 0 0 0 0 0  Number falls in past yr:  0   0  Injury with Fall?  0   0  Risk for fall due to : No Fall Risks No Fall Risks  No Fall Risks No Fall Risks  Follow up Falls prevention discussed Falls prevention discussed;Education provided  Falls prevention discussed Falls prevention discussed    Assessment & Plan  1. PVD (peripheral vascular disease) with claudication (HCC)  Stable, advised to keep walking   2. Senile purpura (HCC)  Reassurance given   3. Essential hypertension  - valsartan-hydrochlorothiazide (DIOVAN-HCT) 320-12.5 MG tablet; Take 1 tablet by mouth daily.  Dispense: 90 tablet; Refill: 0 - COMPLETE METABOLIC PANEL WITH GFR  4. Chronic pain of both shoulders  Initially we were going to give him NSAID's however due to macroalbuminuria  Discussed prednisone possible PMR, we will check sed rate and CRP also   5. Vitamin D deficiency  Continue supplements   6. Low back pain with radiation  - pregabalin (LYRICA) 25 MG capsule; Take 1 capsule (25 mg total) by mouth 2 (two) times daily.  Dispense: 180 capsule; Refill: 0  7. DDD (degenerative disc disease), lumbar  - pregabalin (LYRICA)  25 MG capsule; Take 1 capsule (25 mg total) by mouth 2 (two) times daily.  Dispense: 180 capsule; Refill: 0  8. Dyslipidemia  -  omega-3 acid ethyl esters (LOVAZA) 1 g capsule; Take 2 capsules (2 g total) by mouth 2 (two) times daily.  Dispense: 360 capsule; Refill: 1 - atorvastatin (LIPITOR) 40 MG tablet; Take 1 tablet (40 mg total) by mouth daily.  Dispense: 90 tablet; Refill: 1 - Lipid panel  9. Positive for macroalbuminuria  - valsartan-hydrochlorothiazide (DIOVAN-HCT) 320-12.5 MG tablet; Take 1 tablet by mouth daily.  Dispense: 90 tablet; Refill: 0  10. Hyperglycemia  - Hemoglobin A1c  11. Anemia, unspecified type  - Iron, TIBC and Ferritin Panel - B12 and Folate Panel

## 2023-06-13 ENCOUNTER — Encounter: Payer: Self-pay | Admitting: Family Medicine

## 2023-06-13 ENCOUNTER — Ambulatory Visit (INDEPENDENT_AMBULATORY_CARE_PROVIDER_SITE_OTHER): Payer: Medicare Other | Admitting: Family Medicine

## 2023-06-13 VITALS — BP 128/68 | HR 68 | Temp 97.9°F | Resp 16 | Ht 71.0 in | Wt 205.2 lb

## 2023-06-13 DIAGNOSIS — M25511 Pain in right shoulder: Secondary | ICD-10-CM

## 2023-06-13 DIAGNOSIS — G8929 Other chronic pain: Secondary | ICD-10-CM

## 2023-06-13 DIAGNOSIS — D692 Other nonthrombocytopenic purpura: Secondary | ICD-10-CM

## 2023-06-13 DIAGNOSIS — E559 Vitamin D deficiency, unspecified: Secondary | ICD-10-CM

## 2023-06-13 DIAGNOSIS — I739 Peripheral vascular disease, unspecified: Secondary | ICD-10-CM

## 2023-06-13 DIAGNOSIS — R739 Hyperglycemia, unspecified: Secondary | ICD-10-CM | POA: Diagnosis not present

## 2023-06-13 DIAGNOSIS — M545 Low back pain, unspecified: Secondary | ICD-10-CM

## 2023-06-13 DIAGNOSIS — M25512 Pain in left shoulder: Secondary | ICD-10-CM | POA: Diagnosis not present

## 2023-06-13 DIAGNOSIS — D649 Anemia, unspecified: Secondary | ICD-10-CM

## 2023-06-13 DIAGNOSIS — I10 Essential (primary) hypertension: Secondary | ICD-10-CM | POA: Diagnosis not present

## 2023-06-13 DIAGNOSIS — E785 Hyperlipidemia, unspecified: Secondary | ICD-10-CM

## 2023-06-13 DIAGNOSIS — M5136 Other intervertebral disc degeneration, lumbar region: Secondary | ICD-10-CM

## 2023-06-13 DIAGNOSIS — R809 Proteinuria, unspecified: Secondary | ICD-10-CM

## 2023-06-13 MED ORDER — ATORVASTATIN CALCIUM 40 MG PO TABS
40.0000 mg | ORAL_TABLET | Freq: Every day | ORAL | 1 refills | Status: DC
Start: 2023-06-13 — End: 2024-03-11

## 2023-06-13 MED ORDER — OMEGA-3-ACID ETHYL ESTERS 1 G PO CAPS
2.0000 | ORAL_CAPSULE | Freq: Two times a day (BID) | ORAL | 1 refills | Status: DC
Start: 2023-06-13 — End: 2024-03-11

## 2023-06-13 MED ORDER — PREDNISONE 10 MG PO TABS: 10.0000 mg | ORAL_TABLET | Freq: Two times a day (BID) | ORAL | 0 refills | Status: DC

## 2023-06-13 MED ORDER — PREGABALIN 25 MG PO CAPS
25.0000 mg | ORAL_CAPSULE | Freq: Two times a day (BID) | ORAL | 0 refills | Status: DC
Start: 2023-06-13 — End: 2024-03-11

## 2023-06-13 MED ORDER — VALSARTAN-HYDROCHLOROTHIAZIDE 320-12.5 MG PO TABS
1.0000 | ORAL_TABLET | Freq: Every day | ORAL | 0 refills | Status: DC
Start: 2023-06-13 — End: 2023-09-18

## 2023-06-13 MED ORDER — DICLOFENAC SODIUM 75 MG PO TBEC: 75.0000 mg | DELAYED_RELEASE_TABLET | Freq: Two times a day (BID) | ORAL | 0 refills | Status: DC

## 2023-08-08 ENCOUNTER — Telehealth: Payer: Self-pay | Admitting: *Deleted

## 2023-08-08 NOTE — Telephone Encounter (Signed)
Spoke with nurse Carollee Herter and she said that he needs to see [facility] and then can get the B12 on his visit and then after that it will be a nurse visit for B12

## 2023-08-08 NOTE — Telephone Encounter (Signed)
  Chief Complaint: Results Symptoms: NA Frequency: NA Pertinent Negatives: Patient denies NA Disposition: [] ED /[] Urgent Care (no appt availability in office) / [] Appointment(In office/virtual)/ []  [facility]/ [] Home Care/ [] Refused Recommended Disposition /[] [place] [facility]/ [x]  Follow-up with PCP Additional Notes:   Pt called for results from 06/13/23.  Reviewed result note, pt verbalizes understanding. States he is agreeable to B12 shots. Would like to be seen sooner than 10/30/23 as advised. First available Nov. 15th , placed on wait list.  Can earlier appt be secured for pt? Please advise.

## 2023-09-16 NOTE — Progress Notes (Signed)
This patient is appearing on a report for being at risk of failing the adherence measure for hypertension (ACEi/ARB) medications this calendar year.   Medication: valsartan-hydrochlorothiazide 320-12.5 mg, amlodipine 10 mg Last fill date: 06/12/23 for 90 day supply  Patient is due for refills for his two blood pressure medications. Denies AE with amlodipine or valsartan-hydrochlorothiazide. Denies s/sx of hypotension. Pt does not have any refills remaining at the pharmacy. Will collaborate with prescriber to facilitate refills.   Nils Pyle, PharmD PGY1 Pharmacy Resident

## 2023-09-18 ENCOUNTER — Other Ambulatory Visit: Payer: Self-pay | Admitting: Family Medicine

## 2023-09-18 DIAGNOSIS — I10 Essential (primary) hypertension: Secondary | ICD-10-CM

## 2023-09-18 DIAGNOSIS — R809 Proteinuria, unspecified: Secondary | ICD-10-CM

## 2023-09-18 MED ORDER — AMLODIPINE BESYLATE 10 MG PO TABS
10.0000 mg | ORAL_TABLET | Freq: Every day | ORAL | 0 refills | Status: DC
Start: 2023-09-18 — End: 2023-09-28

## 2023-09-18 MED ORDER — VALSARTAN-HYDROCHLOROTHIAZIDE 320-12.5 MG PO TABS
1.0000 | ORAL_TABLET | Freq: Every day | ORAL | 0 refills | Status: DC
Start: 2023-09-18 — End: 2023-09-28

## 2023-09-27 NOTE — Progress Notes (Signed)
Name: Frank Hamilton   MRN: [id number]    DOB: 06/19/1941   Date:09/28/2023       Progress Note  Subjective  Chief Complaint  Follow Up  HPI  New onset DM: A1C 6.7 % 05/2023. He denies polyphagia, polydipsia or polyuria , he has dyslipidemia and macro albuminuria, he also has HTN. He is off statin therapy, but takes ARB and BP is at goal   HTN: taking medication and denies side effects, no chest pain or palpitation He is  on ARB, HCTZ and Norvasc . BP is at goal for him   Exposure to Agent Orange: A1C done 02/2022 was 6 %and now in DM range 6.7 % in July 2024 He denies polyphagia, polydipsia or polyuria   Dyslipidemia: Atorvastatin and Lovaza, he is currently off statin therapy because since sister developed statin myopathy and he thought shoulder pain could have been secondary to myopathy, he states symptoms improved since he stopped taking atorvastatin , advised to let it completely resolve and try to resume medication to see if truly statin myopathy    GERD: symptoms stable takes Tums prn   Low back pain: he used to see  Dr. Cherylann Ratel, he has MRI ( below ), he states since started on Lyrica that he only takes prn due to somnolence the following day, we will adjust dose to 25 mg and may take one at dinner and one before bedtime to see if side effects decreases, he has noticed some calve weakness. No bowel or bladder incontinence.  He has not been back to see him since 2022    IMPRESSION: 1. L3-4: Disc herniation more prominent towards the right with caudal migration down behind L4. Facet and ligamentous hypertrophy. Severe multifactorial stenosis at this level, particularly on the right. Neural compression seems certain at this level, particularly on the right affecting at least the L4 nerve. 2. L4-5: Severe multifactorial spinal stenosis that could cause neural compression on either or both sides. Foraminal stenosis right worse than left. 3. L2-3: Mild multifactorial stenosis, most  prominent in the left lateral recess. Left foraminal stenosis that could affect the left L2 nerve. 4. L5-S1: Left foraminal stenosis because of facet osteophytes and bulging disc material could affect the exiting L5 nerve.  PVD with claudication: mildly abnormal ABI he had PT ,  he can walk down his driveway - 2/3 mile without having to stop. He stopped taking statin therapy recently because he found out his sister had statin therapy and he was having shoulder pain - it has improved since he stopped medication  Mild aortic stenosis: had echo done in 2018, denies chest pain, palpitation, dizziness, he went back to cardiologist and had repeat Echo 08/2022 and stable .   IMPRESSIONS Left ventricular ejection fraction, by estimation, is 60 to 65%. The left ventricle has normal function. The left ventricle has no regional wall motion abnormalities. There is moderate asymmetric left ventricular hypertrophy of the septal segment. Left ventricular diastolic parameters are consistent with Grade I diastolic dysfunction (impaired relaxation). The average left ventricular global longitudinal strain is -18.2 %. The global longitudinal strain is normal. 1. 2. Right ventricular systolic function is normal. The right ventricular size is normal. 3. The mitral valve is normal in structure. No evidence of mitral valve regurgitation. The aortic valve is tricuspid. Aortic valve regurgitation is mild. Mild aortic valve stenosis. Aortic valve mean gradient measures 19.0 mmHg. 4. The inferior vena cava is normal in size with greater than 50% respiratory variability,  suggesting right atrial pressure of 3 mmHg.   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/08/2022   Spinal stenosis, lumbar region,  with neurogenic claudication 02/10/2021   Chronic radicular lumbar pain 02/10/2021   Lumbar spondylosis 02/10/2021   DDD (degenerative disc disease), lumbar 02/10/2021   Chronic pain syndrome 02/10/2021   Positive for macroalbuminuria 08/06/2018   PVD (peripheral vascular disease) with claudication (HCC) 07/02/2018   Senile purpura (HCC) 07/02/2018   Claudication (HCC) 04/23/2018   B12 deficiency 05/02/2016   Vitamin D deficiency 05/02/2016   Osteoarthritis of both hands 05/01/2016   Essential hypertension 08/03/2015   Dyslipidemia 08/03/2015   Kidney cysts 12/15/2013    Past Surgical History:  Procedure Laterality Date   bowel obstruction  2011   CATARACT EXTRACTION, BILATERAL  2017   HERNIA REPAIR  2009 and 2010   TONSILLECTOMY     age 82 or 33    Family History  Problem Relation Age of Onset   Heart disease Mother    Stroke Father    Heart disease Sister    Diabetes Sister    Diabetes Maternal Grandfather     Social History   Tobacco Use   Smoking status: Former    Current packs/day: 0.00    Average packs/day: 1 pack/day for 20.0 years (20.0 ttl pk-yrs)    Types: Cigarettes    Start date: 06/08/1954    Quit date: 06/08/1974    Years since quitting: 49.3   Smokeless tobacco: Never  Substance Use Topics   Alcohol use: Yes    Alcohol/week: 1.0 standard drink of alcohol    Types: 1 Cans of beer per week    Comment: social     Current Outpatient Medications:    aspirin 81 MG chewable tablet, Chew 81 mg by mouth., Disp: , Rfl:    atorvastatin (LIPITOR) 40 MG tablet, Take 1 tablet (40 mg total) by mouth daily., Disp: 90 tablet, Rfl: 1   Cholecalciferol (VITAMIN D) 2000 units CAPS, Take 1 capsule (2,000 Units total) by mouth daily., Disp: 30 capsule, Rfl:    glucosamine-chondroitin 500-400 MG tablet, Take 1 tablet by mouth in the morning and at bedtime. , Disp: , Rfl:    latanoprost (XALATAN) 0.005 % ophthalmic solution, SMARTSIG:1 Drop(s) In Eye(s) Every Evening,  Disp: , Rfl:    omega-3 acid ethyl esters (LOVAZA) 1 g capsule, Take 2 capsules (2 g total) by mouth 2 (two) times daily., Disp: 360 capsule, Rfl: 1   pregabalin (LYRICA) 25 MG capsule, Take 1 capsule (25 mg total) by mouth 2 (two) times daily., Disp: 180 capsule, Rfl: 0   amLODipine (NORVASC) 10 MG tablet, Take 1 tablet (10 mg total) by mouth daily., Disp: 90 tablet, Rfl: 0   valsartan-hydrochlorothiazide (DIOVAN-HCT) 320-12.5 MG tablet, Take 1 tablet by mouth daily., Disp: 90 tablet, Rfl: 0  Allergies  Allergen Reactions   Gabapentin Rash    Headache also reported   Sulfa Antibiotics Itching and Rash    I personally reviewed active problem list, medication list, allergies, family history, social history, health maintenance with the patient/caregiver today.   ROS  Ten systems reviewed and is negative except as mentioned in HPI    Objective  Vitals:   09/28/23 1356  BP: 134/70  Pulse:  60  Resp: 16  Temp: 97.6 F (36.4 C)  TempSrc: Oral  SpO2: 95%  Weight: 201 lb 11.2 oz (91.5 kg)  Height: 5\' 11"  (1.803 m)    Body mass index is 28.13 kg/m.  Physical Exam  Constitutional: Patient appears well-developed and well-nourished.  No distress.  HEENT: head atraumatic, normocephalic, pupils equal and reactive to light, neck supple Cardiovascular: Normal rate, regular rhythm and normal heart sounds.  No murmur heard. No BLE edema. Pulmonary/Chest: Effort normal and breath sounds normal. No respiratory distress. Abdominal: Soft.  There is no tenderness. Psychiatric: Patient has a normal mood and affect. behavior is normal. Judgment and thought content normal.    PHQ2/9:    09/28/2023    1:58 PM 06/13/2023   11:00 AM 01/19/2023   10:31 AM 09/08/2022   11:10 AM 03/10/2022   11:03 AM  Depression screen PHQ 2/9  Decreased Interest 0 0 0 0 0  Down, Depressed, Hopeless 0 0 0 0 0  PHQ - 2 Score 0 0 0 0 0  Altered sleeping 0 0  0 1  Tired, decreased energy 0 0  0 1  Change in  appetite 0 0  0 0  Feeling bad or failure about yourself  0 0  0 0  Trouble concentrating 0 0  0 0  Moving slowly or fidgety/restless 0 0  0 0  Suicidal thoughts 0 0  0 0  PHQ-9 Score 0 0  0 2  Difficult doing work/chores     Not difficult at all    phq 9 is negative   Fall Risk:    09/28/2023    1:58 PM 06/13/2023   11:00 AM 01/19/2023   10:25 AM 09/08/2022   11:07 AM 03/10/2022   11:02 AM  Fall Risk   Falls in the past year? 0 0 0 0 0  Number falls in past yr:   0    Injury with Fall?   0    Risk for fall due to : No Fall Risks No Fall Risks No Fall Risks  No Fall Risks  Follow up Falls prevention discussed Falls prevention discussed Falls prevention discussed;Education provided  Falls prevention discussed      Functional Status Survey: Is the patient deaf or have difficulty hearing?: Yes Does the patient have difficulty seeing, even when wearing glasses/contacts?: No Does the patient have difficulty concentrating, remembering, or making decisions?: No Does the patient have difficulty walking or climbing stairs?: Yes Does the patient have difficulty dressing or bathing?: No Does the patient have difficulty doing errands alone such as visiting a doctor's office or shopping?: No    Assessment & Plan  1. Hypertension associated with type 2 diabetes mellitus (HCC)  - Amb ref to Medical Nutrition Therapy-MNT - Microalbumin / creatinine urine ratio - Hemoglobin A1c - valsartan-hydrochlorothiazide (DIOVAN-HCT) 320-12.5 MG tablet; Take 1 tablet by mouth daily.  Dispense: 90 tablet; Refill: 0 - amLODipine (NORVASC) 10 MG tablet; Take 1 tablet (10 mg total) by mouth daily.  Dispense: 90 tablet; Refill: 0  2. Senile purpura (HCC)  Reassurance given   3. PVD (peripheral vascular disease) with claudication (HCC)  He has held statin therapy, symptoms are stable   4. Controlled type 2 diabetes mellitus with microalbuminuria, without long-term current use of insulin (HCC)  -  Amb ref to Medical Nutrition Therapy-MNT - valsartan-hydrochlorothiazide (DIOVAN-HCT) 320-12.5 MG tablet; Take 1 tablet by mouth daily.  Dispense: 90 tablet; Refill: 0  5. Vitamin D deficiency  Continue supplements   6. Primary osteoarthritis of both hands  stable  7. Aortic stenosis, mild  Needs to follow up with cardiologist   8. Need for immunization against influenza  - Flu Vaccine Trivalent High Dose (Fluad)  9. B12 deficiency  He has been taking supplements and will return for labs - B12 and Folate Panel

## 2023-09-28 ENCOUNTER — Ambulatory Visit (INDEPENDENT_AMBULATORY_CARE_PROVIDER_SITE_OTHER): Payer: Medicare Other | Admitting: Family Medicine

## 2023-09-28 ENCOUNTER — Encounter: Payer: Self-pay | Admitting: Family Medicine

## 2023-09-28 VITALS — BP 134/70 | HR 60 | Temp 97.6°F | Resp 16 | Ht 71.0 in | Wt 201.7 lb

## 2023-09-28 DIAGNOSIS — Z23 Encounter for immunization: Secondary | ICD-10-CM | POA: Diagnosis not present

## 2023-09-28 DIAGNOSIS — I739 Peripheral vascular disease, unspecified: Secondary | ICD-10-CM

## 2023-09-28 DIAGNOSIS — I152 Hypertension secondary to endocrine disorders: Secondary | ICD-10-CM | POA: Diagnosis not present

## 2023-09-28 DIAGNOSIS — E538 Deficiency of other specified B group vitamins: Secondary | ICD-10-CM

## 2023-09-28 DIAGNOSIS — E559 Vitamin D deficiency, unspecified: Secondary | ICD-10-CM

## 2023-09-28 DIAGNOSIS — E1129 Type 2 diabetes mellitus with other diabetic kidney complication: Secondary | ICD-10-CM | POA: Diagnosis not present

## 2023-09-28 DIAGNOSIS — R809 Proteinuria, unspecified: Secondary | ICD-10-CM

## 2023-09-28 DIAGNOSIS — M19042 Primary osteoarthritis, left hand: Secondary | ICD-10-CM

## 2023-09-28 DIAGNOSIS — E1159 Type 2 diabetes mellitus with other circulatory complications: Secondary | ICD-10-CM

## 2023-09-28 DIAGNOSIS — M19041 Primary osteoarthritis, right hand: Secondary | ICD-10-CM

## 2023-09-28 DIAGNOSIS — D692 Other nonthrombocytopenic purpura: Secondary | ICD-10-CM | POA: Diagnosis not present

## 2023-09-28 DIAGNOSIS — I350 Nonrheumatic aortic (valve) stenosis: Secondary | ICD-10-CM

## 2023-09-28 MED ORDER — VALSARTAN-HYDROCHLOROTHIAZIDE 320-12.5 MG PO TABS: 1.0000 | ORAL_TABLET | Freq: Every day | ORAL | 0 refills | Status: DC

## 2023-09-28 MED ORDER — AMLODIPINE BESYLATE 10 MG PO TABS: 10.0000 mg | ORAL_TABLET | Freq: Every day | ORAL | 0 refills | Status: DC

## 2023-09-28 NOTE — Patient Instructions (Addendum)
Team Member Role and Visual merchandiser Info Address Start End Comments  Debbe Odea, MD Consulting Physician (Cardiology) Phone: [phone] Fax: [phone] Email: [email or web address] [address] [place] [address] 09/28/2023 - -   Team Member Role and Specialty Contact Info Address Start End Comments  Lamont Dowdy, MD Consulting Physician (Nephrology) Phone: [phone] Fax: [phone] [place] [address] 01/11/2021 - -

## 2023-10-19 NOTE — Progress Notes (Signed)
Name: Frank Hamilton   MRN: [id number]    DOB: 23-Oct-1941   Date:10/30/2023       Progress Note  Subjective  Chief Complaint  Chief Complaint  Patient presents with   Medical Management of Chronic Issues    HPI  Discussed the use of AI scribe software for clinical note transcription with the patient, who gave verbal consent to proceed.  History of Present Illness   The patient, an 82 year old diagnosed with type 2 diabetes in July 2024 , presented for a follow-up visit after recent lab work. The patient's HbA1c was well-controlled at 6.4%, however, there was a noted increase in microalbuminuria. Over the past five years, the patient's microalbumin levels have risen from 342 to 616, indicating a potential issue with kidney function. Despite this, the patient's glomerular filtration rate (GFR) remains normal at 94  The patient reported no significant changes in lifestyle or diet, and has been consuming more protein and fewer carbohydrates due to their diabetes. The patient denied any symptoms of excessive hunger, thirst, or frequent urination.  The patient also has a known heart murmur, for which they have seen a cardiologist in the past. The most recent echocardiogram showed normal heart function with mild aortic valve stenosis. The patient has not reported any symptoms related to this condition.         Patient Active Problem List   Diagnosis Date Noted   Exposure to Agent [facility] 09/08/2022   Aortic stenosis, mild 09/08/2022   Bilateral hearing loss 09/08/2022   Tinnitus of both ears 09/08/2022   Spinal stenosis, lumbar region, with neurogenic claudication 02/10/2021   Chronic radicular lumbar pain 02/10/2021   Lumbar spondylosis 02/10/2021   DDD (degenerative disc disease), lumbar 02/10/2021   Chronic pain syndrome 02/10/2021   Positive for macroalbuminuria 08/06/2018   PVD (peripheral vascular disease) with claudication (HCC) 07/02/2018   Senile purpura (HCC) 07/02/2018    Claudication (HCC) 04/23/2018   B12 deficiency 05/02/2016   Vitamin D deficiency 05/02/2016   Osteoarthritis of both hands 05/01/2016   Essential hypertension 08/03/2015   Dyslipidemia 08/03/2015   Kidney cysts 12/15/2013    Past Surgical History:  Procedure Laterality Date   bowel obstruction  2011   CATARACT EXTRACTION, BILATERAL  2017   HERNIA REPAIR  2009 and 2010   TONSILLECTOMY     age 44 or 20    Family History  Problem Relation Age of Onset   Heart disease Mother    Stroke Father    Heart disease Sister    Diabetes Sister    Diabetes Maternal Grandfather     Social History   Tobacco Use   Smoking status: Former    Current packs/day: 0.00    Average packs/day: 1 pack/day for 20.0 years (20.0 ttl pk-yrs)    Types: Cigarettes    Start date: 06/08/1954    Quit date: 06/08/1974    Years since quitting: 49.4   Smokeless tobacco: Never  Substance Use Topics   Alcohol use: Yes    Alcohol/week: 1.0 standard drink of alcohol    Types: 1 Cans of beer per week    Comment: social     Current Outpatient Medications:    amLODipine (NORVASC) 10 MG tablet, Take 1 tablet (10 mg total) by mouth daily., Disp: 90 tablet, Rfl: 0   aspirin 81 MG chewable tablet, Chew 81 mg by mouth., Disp: , Rfl:    atorvastatin (LIPITOR) 40 MG tablet, Take 1 tablet (40 mg total) by mouth  daily., Disp: 90 tablet, Rfl: 1   Cholecalciferol (VITAMIN D) 2000 units CAPS, Take 1 capsule (2,000 Units total) by mouth daily., Disp: 30 capsule, Rfl:    glucosamine-chondroitin 500-400 MG tablet, Take 1 tablet by mouth in the morning and at bedtime. , Disp: , Rfl:    latanoprost (XALATAN) 0.005 % ophthalmic solution, SMARTSIG:1 Drop(s) In Eye(s) Every Evening, Disp: , Rfl:    omega-3 acid ethyl esters (LOVAZA) 1 g capsule, Take 2 capsules (2 g total) by mouth 2 (two) times daily., Disp: 360 capsule, Rfl: 1   pregabalin (LYRICA) 25 MG capsule, Take 1 capsule (25 mg total) by mouth 2 (two) times daily., Disp:  180 capsule, Rfl: 0   valsartan-hydrochlorothiazide (DIOVAN-HCT) 320-12.5 MG tablet, Take 1 tablet by mouth daily., Disp: 90 tablet, Rfl: 0  Allergies  Allergen Reactions   Gabapentin Rash    Headache also reported   Sulfa Antibiotics Itching and Rash    I personally reviewed active problem list, medication list, allergies with the patient/caregiver today.   ROS  Ten systems reviewed and is negative except as mentioned in HPI    Objective  Vitals:   10/30/23 0951  BP: 118/72  Pulse: 63  Resp: 16  Temp: (!) 97.5 F (36.4 C)  TempSrc: Oral  SpO2: 97%  Weight: 204 lb 12.8 oz (92.9 kg)  Height: 5\' 11"  (1.803 m)    Body mass index is 28.56 kg/m.  Physical Exam  Constitutional: Patient appears well-developed and well-nourished. No distress.  HEENT: head atraumatic, normocephalic, pupils equal and reactive to light, neck supple Cardiovascular: Normal rate, regular rhythm, harsh holosystolic murmur 3/6 on right second intercostal space.  No BLE edema. Pulmonary/Chest: Effort normal and breath sounds normal. No respiratory distress. Abdominal: Soft.  There is no tenderness. Psychiatric: Patient has a normal mood and affect. behavior is normal. Judgment and thought content normal.   Recent Results (from the past 2160 hours)  Microalbumin / creatinine urine ratio     Status: Abnormal   Collection Time: 10/24/23  9:08 AM  Result Value Ref Range   Creatinine, Urine 113 20 - 320 mg/dL   Microalb, Ur 40.9 mg/dL    Comment: Verified by repeat analysis. Marland Kitchen Reference Range Not established    Microalb Creat Ratio 616 (H) <30 mg/g creat    Comment: . The ADA defines abnormalities in albumin excretion as follows: Marland Kitchen Albuminuria Category        Result (mg/g creatinine) . Normal to Mildly increased   <30 Moderately increased         30-299  Severely increased           > OR = 300 . The ADA recommends that at least two of three specimens collected within a 3-6 month period  be abnormal before considering a patient to be within a diagnostic category.   Hemoglobin A1c     Status: Abnormal   Collection Time: 10/24/23  9:08 AM  Result Value Ref Range   Hgb A1c MFr Bld 6.4 (H) <5.7 % of total Hgb    Comment: For someone without known diabetes, a hemoglobin  A1c value between 5.7% and 6.4% is consistent with prediabetes and should be confirmed with a  follow-up test. . For someone with known diabetes, a value <7% indicates that their diabetes is well controlled. A1c targets should be individualized based on duration of diabetes, age, comorbid conditions, and other considerations. . This assay result is consistent with an increased risk of diabetes. Marland Kitchen  Currently, no consensus exists regarding use of hemoglobin A1c for diagnosis of diabetes for children. .    Mean Plasma Glucose 137 mg/dL   eAG (mmol/L) 7.6 mmol/L  B12 and Folate Panel     Status: Abnormal   Collection Time: 10/24/23  9:08 AM  Result Value Ref Range   Vitamin B-12 1,387 (H) 200 - 1,100 pg/mL   Folate 13.1 ng/mL    Comment:                            Reference Range                            Low:           <3.4                            Borderline:    3.4-5.4                            Normal:        >5.4 .       PHQ2/9:    09/28/2023    1:58 PM 06/13/2023   11:00 AM 01/19/2023   10:31 AM 09/08/2022   11:10 AM 03/10/2022   11:03 AM  Depression screen PHQ 2/9  Decreased Interest 0 0 0 0 0  Down, Depressed, Hopeless 0 0 0 0 0  PHQ - 2 Score 0 0 0 0 0  Altered sleeping 0 0  0 1  Tired, decreased energy 0 0  0 1  Change in appetite 0 0  0 0  Feeling bad or failure about yourself  0 0  0 0  Trouble concentrating 0 0  0 0  Moving slowly or fidgety/restless 0 0  0 0  Suicidal thoughts 0 0  0 0  PHQ-9 Score 0 0  0 2  Difficult doing work/chores     Not difficult at all    phq 9 is negative   Fall Risk:    09/28/2023    1:58 PM 06/13/2023   11:00 AM 01/19/2023   10:25 AM  09/08/2022   11:07 AM 03/10/2022   11:02 AM  Fall Risk   Falls in the past year? 0 0 0 0 0  Number falls in past yr:   0    Injury with Fall?   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 Discussed the options of referral to nephrology, starting an SGLT2 inhibitor, or continuing current management and monitoring. -Start Jardiance 25mg  daily for additional renal protection. -Discontinue current antihypertensive medications (amlodipine, valsartan, HCTZ) and start combination amlodipine/valsartan 10/320 mg to avoid hypotension. -Return in 3 months for follow-up and reassessment.  Heart Murmur Known murmur with mild aortic stenosis on previous echocardiogram. No new symptoms or concerns. -Continue current management and monitoring.  Vaccinations Patient has received pneumococcal and influenza vaccines, but not Shingrix or RSV. -Encourage patient to receive Shingrix and RSV vaccines at  local pharmacy.

## 2023-10-24 DIAGNOSIS — E1159 Type 2 diabetes mellitus with other circulatory complications: Secondary | ICD-10-CM | POA: Diagnosis not present

## 2023-10-24 DIAGNOSIS — I152 Hypertension secondary to endocrine disorders: Secondary | ICD-10-CM | POA: Diagnosis not present

## 2023-10-24 DIAGNOSIS — E538 Deficiency of other specified B group vitamins: Secondary | ICD-10-CM | POA: Diagnosis not present

## 2023-10-25 LAB — MICROALBUMIN / CREATININE URINE RATIO
Creatinine, Urine: 113 mg/dL (ref 20–320)
Microalb Creat Ratio: 616 mg/g{creat} — ABNORMAL HIGH (ref <30)
Microalb, Ur: 69.6 mg/dL

## 2023-10-25 LAB — HEMOGLOBIN A1C
Hgb A1c MFr Bld: 6.4 %{Hb} — ABNORMAL HIGH (ref <5.7)
Mean Plasma Glucose: 137 mg/dL
eAG (mmol/L): 7.6 mmol/L

## 2023-10-25 LAB — B12 AND FOLATE PANEL
Folate: 13.1 ng/mL
Vitamin B-12: 1387 pg/mL — ABNORMAL HIGH (ref 200–1100)

## 2023-10-30 ENCOUNTER — Encounter: Payer: Self-pay | Admitting: Family Medicine

## 2023-10-30 ENCOUNTER — Ambulatory Visit: Payer: Medicare Other | Admitting: Family Medicine

## 2023-10-30 VITALS — BP 118/72 | HR 63 | Temp 97.5°F | Resp 16 | Ht 71.0 in | Wt 204.8 lb

## 2023-10-30 DIAGNOSIS — R809 Proteinuria, unspecified: Secondary | ICD-10-CM | POA: Diagnosis not present

## 2023-10-30 DIAGNOSIS — I152 Hypertension secondary to endocrine disorders: Secondary | ICD-10-CM

## 2023-10-30 DIAGNOSIS — E1159 Type 2 diabetes mellitus with other circulatory complications: Secondary | ICD-10-CM | POA: Diagnosis not present

## 2023-10-30 DIAGNOSIS — Z7984 Long term (current) use of oral hypoglycemic drugs: Secondary | ICD-10-CM

## 2023-10-30 DIAGNOSIS — R011 Cardiac murmur, unspecified: Secondary | ICD-10-CM | POA: Diagnosis not present

## 2023-10-30 MED ORDER — EMPAGLIFLOZIN 25 MG PO TABS: 25.0000 mg | ORAL_TABLET | Freq: Every day | ORAL | 0 refills | Status: DC

## 2023-10-30 MED ORDER — AMLODIPINE BESYLATE-VALSARTAN 10-320 MG PO TABS: 1.0000 | ORAL_TABLET | Freq: Every day | ORAL | 0 refills | Status: DC

## 2023-10-30 NOTE — Patient Instructions (Signed)
Stop amlodipine Stop valsartan hydrochlorothiazide Start  Amlodipine valsartan 10/320  Start  Jardiance 25 mg

## 2023-11-01 ENCOUNTER — Ambulatory Visit: Payer: Medicare Other | Admitting: Dermatology

## 2023-11-01 DIAGNOSIS — W908XXA Exposure to other nonionizing radiation, initial encounter: Secondary | ICD-10-CM

## 2023-11-01 DIAGNOSIS — D229 Melanocytic nevi, unspecified: Secondary | ICD-10-CM

## 2023-11-01 DIAGNOSIS — L821 Other seborrheic keratosis: Secondary | ICD-10-CM

## 2023-11-01 DIAGNOSIS — L820 Inflamed seborrheic keratosis: Secondary | ICD-10-CM | POA: Diagnosis not present

## 2023-11-01 DIAGNOSIS — Z1283 Encounter for screening for malignant neoplasm of skin: Secondary | ICD-10-CM

## 2023-11-01 DIAGNOSIS — L578 Other skin changes due to chronic exposure to nonionizing radiation: Secondary | ICD-10-CM

## 2023-11-01 DIAGNOSIS — D1801 Hemangioma of skin and subcutaneous tissue: Secondary | ICD-10-CM

## 2023-11-01 DIAGNOSIS — Z872 Personal history of diseases of the skin and subcutaneous tissue: Secondary | ICD-10-CM

## 2023-11-01 DIAGNOSIS — L918 Other hypertrophic disorders of the skin: Secondary | ICD-10-CM

## 2023-11-01 DIAGNOSIS — L814 Other melanin hyperpigmentation: Secondary | ICD-10-CM

## 2023-11-01 NOTE — Progress Notes (Signed)
   Follow-Up Visit   Subjective  Frank Hamilton is a 82 y.o. male who presents for the following: Skin Cancer Screening and Full Body Skin Exam Hx of aks, hx of isks   Itchy spot at right lower back  The patient presents for Total-Body Skin Exam (TBSE) for skin cancer screening and mole check. The patient has spots, moles and lesions to be evaluated, some may be new or changing and the patient may have concern these could be cancer.    The following portions of the chart were reviewed this encounter and updated as appropriate: medications, allergies, medical history  Review of Systems:  No other skin or systemic complaints except as noted in HPI or Assessment and Plan.  Objective  Well appearing patient in no apparent distress; mood and affect are within normal limits.  A full examination was performed including scalp, head, eyes, ears, nose, lips, neck, chest, axillae, abdomen, back, buttocks, bilateral upper extremities, bilateral lower extremities, hands, feet, fingers, toes, fingernails, and toenails. All findings within normal limits unless otherwise noted below.   Relevant physical exam findings are noted in the Assessment and Plan.  Right Lower Back x 1 Erythematous stuck-on, waxy papule or plaque  Assessment & Plan   SKIN CANCER SCREENING PERFORMED TODAY.  ACTINIC DAMAGE - Chronic condition, secondary to cumulative UV/sun exposure - diffuse scaly erythematous macules with underlying dyspigmentation - Recommend daily broad spectrum sunscreen SPF 30+ to sun-exposed areas, reapply every 2 hours as needed.  - Staying in the shade or wearing long sleeves, sun glasses (UVA+UVB protection) and wide brim hats (4-inch brim around the entire circumference of the hat) are also recommended for sun protection.  - Call for new or changing lesions.  LENTIGINES, SEBORRHEIC KERATOSES, HEMANGIOMAS - Benign normal skin lesions - Benign-appearing - Call for any changes  MELANOCYTIC  NEVI - Tan-brown and/or pink-flesh-colored symmetric macules and papules - Benign appearing on exam today - Observation - Call clinic for new or changing moles - Recommend daily use of broad spectrum spf 30+ sunscreen to sun-exposed areas.   Acrochordons (Skin Tags) Neck and axilla  - Fleshy, skin-colored pedunculated papules - Benign appearing.  - Observe. - If desired, they can be removed with an in office procedure that is not covered by insurance. - Please call the clinic if you notice any new or changing lesions.     INFLAMED SEBORRHEIC KERATOSIS Right Lower Back x 1 Symptomatic, irritating, patient would like treated. Destruction of lesion - Right Lower Back x 1 Complexity: simple   Destruction method: cryotherapy   Informed consent: discussed and consent obtained   Timeout:  patient name, date of birth, surgical site, and procedure verified Lesion destroyed using liquid nitrogen: Yes   Region frozen until ice ball extended beyond lesion: Yes   Outcome: patient tolerated procedure well with no complications   Post-procedure details: wound care instructions given   Return in about 1 year (around 10/31/2024) for TBSE.  IAsher Muir, CMA, am acting as scribe for Armida Sans, MD.   Documentation: I have reviewed the above documentation for accuracy and completeness, and I agree with the above.  Armida Sans, MD

## 2023-11-01 NOTE — Patient Instructions (Addendum)

## 2023-11-12 ENCOUNTER — Encounter: Payer: Self-pay | Admitting: Dermatology

## 2023-11-22 ENCOUNTER — Encounter: Payer: Medicare Other | Attending: Family Medicine | Admitting: Dietician

## 2023-11-22 ENCOUNTER — Encounter: Payer: Self-pay | Admitting: Dietician

## 2023-11-22 DIAGNOSIS — E119 Type 2 diabetes mellitus without complications: Secondary | ICD-10-CM

## 2023-11-22 DIAGNOSIS — I152 Hypertension secondary to endocrine disorders: Secondary | ICD-10-CM | POA: Insufficient documentation

## 2023-11-22 DIAGNOSIS — E1129 Type 2 diabetes mellitus with other diabetic kidney complication: Secondary | ICD-10-CM | POA: Insufficient documentation

## 2023-11-22 DIAGNOSIS — E1159 Type 2 diabetes mellitus with other circulatory complications: Secondary | ICD-10-CM | POA: Diagnosis not present

## 2023-11-22 DIAGNOSIS — R809 Proteinuria, unspecified: Secondary | ICD-10-CM | POA: Insufficient documentation

## 2023-11-22 DIAGNOSIS — Z713 Dietary counseling and surveillance: Secondary | ICD-10-CM | POA: Diagnosis not present

## 2023-11-22 NOTE — Progress Notes (Signed)
 Patient was seen on 11/22/2023 for the first of a series of three diabetes self-management courses at the [facility].  Patient Education Plan per assessed needs and concerns is to attend three course education program for Diabetes Self Management Education.  A1C was 6.4% on 10/24/2023, 6.7% on 06/13/2023.  The following learning objectives were met by the patient during this class: Describe diabetes, types of diabetes and pathophysiology State some common risk factors for diabetes Defines the role of glucose and insulin Describe the relationship between diabetes and cardiovascular and other risks State the members of the Healthcare Team States the rationale for glucose monitoring and when to test State their individual Target Range State the importance of logging glucose readings and how to interpret the readings Identifies A1C target Explain the correlation between A1c and eAG values State symptoms and treatment of high blood glucose and low blood glucose Explain proper technique for glucose testing and identify proper sharps disposal  Handouts given during class include: How to Thrive:  A Guide for Your Journey with Diabetes by the ADA Meal Plan Card and carbohydrate content list Dietary intake form Low Sodium Flavoring Tips Types of Fats Dining Out Label reading Snack list The diabetes portion plate Diabetes Resources A1c to eAG Conversion Chart Blood Glucose Log Diabetes Recommended Care Schedule Support Group Diabetes Success Plan Core Class Satisfaction Survey   Follow-Up Plan: Attend core 2

## 2023-11-29 ENCOUNTER — Encounter: Payer: Self-pay | Admitting: Dietician

## 2023-11-29 ENCOUNTER — Encounter: Payer: Medicare Other | Admitting: Dietician

## 2023-11-29 DIAGNOSIS — Z713 Dietary counseling and surveillance: Secondary | ICD-10-CM | POA: Diagnosis not present

## 2023-11-29 DIAGNOSIS — E1159 Type 2 diabetes mellitus with other circulatory complications: Secondary | ICD-10-CM | POA: Diagnosis not present

## 2023-11-29 DIAGNOSIS — R809 Proteinuria, unspecified: Secondary | ICD-10-CM | POA: Diagnosis not present

## 2023-11-29 DIAGNOSIS — E119 Type 2 diabetes mellitus without complications: Secondary | ICD-10-CM

## 2023-11-29 DIAGNOSIS — E1129 Type 2 diabetes mellitus with other diabetic kidney complication: Secondary | ICD-10-CM | POA: Diagnosis not present

## 2023-11-29 DIAGNOSIS — K080 Exfoliation of teeth due to systemic causes: Secondary | ICD-10-CM | POA: Diagnosis not present

## 2023-11-29 DIAGNOSIS — I152 Hypertension secondary to endocrine disorders: Secondary | ICD-10-CM | POA: Diagnosis not present

## 2023-11-29 NOTE — Progress Notes (Signed)
Patient was seen on 11/28/2023 for the second of a series of three diabetes self-management courses at the [facility]. The following learning objectives were met by the patient during this class:  Describe the role of different macronutrients on glucose Explain how carbohydrates affect blood glucose State what foods contain the most carbohydrates Demonstrate carbohydrate counting Demonstrate how to read Nutrition Facts food label Describe effects of various fats on heart health Describe the importance of good nutrition for health and healthy eating strategies Describe techniques for managing your shopping, cooking and meal planning List strategies to follow meal plan when dining out Describe the effects of alcohol on glucose and how to use it safely  Goals:  Follow Diabetes Meal Plan as instructed  Aim to spread carbs evenly throughout the day  Aim for 3 meals per day and snacks as needed Include lean protein foods to meals/snacks  Monitor glucose levels as instructed by your doctor   Follow-Up Plan: Attend Core 3 Work towards following your personal food plan.

## 2023-12-06 ENCOUNTER — Encounter: Payer: Self-pay | Admitting: Dietician

## 2023-12-06 ENCOUNTER — Encounter: Payer: Medicare Other | Admitting: Dietician

## 2023-12-06 DIAGNOSIS — R809 Proteinuria, unspecified: Secondary | ICD-10-CM | POA: Diagnosis not present

## 2023-12-06 DIAGNOSIS — E1159 Type 2 diabetes mellitus with other circulatory complications: Secondary | ICD-10-CM | POA: Diagnosis not present

## 2023-12-06 DIAGNOSIS — E119 Type 2 diabetes mellitus without complications: Secondary | ICD-10-CM

## 2023-12-06 DIAGNOSIS — E1129 Type 2 diabetes mellitus with other diabetic kidney complication: Secondary | ICD-10-CM | POA: Diagnosis not present

## 2023-12-06 DIAGNOSIS — I152 Hypertension secondary to endocrine disorders: Secondary | ICD-10-CM | POA: Diagnosis not present

## 2023-12-06 DIAGNOSIS — H401111 Primary open-angle glaucoma, right eye, mild stage: Secondary | ICD-10-CM | POA: Diagnosis not present

## 2023-12-06 DIAGNOSIS — Z713 Dietary counseling and surveillance: Secondary | ICD-10-CM | POA: Diagnosis not present

## 2023-12-06 LAB — HM DIABETES EYE EXAM

## 2023-12-06 NOTE — Progress Notes (Signed)
Patient was seen on 12/06/2023 for the third of a series of three diabetes self-management courses at the [facility].   State the amount of activity recommended for healthy living Describe activities suitable for individual needs Identify ways to regularly incorporate activity into daily life Identify barriers to activity and ways to over come these barriers Identify diabetes medications being personally used and their primary action for lowering glucose and possible side effects Describe role of stress on blood glucose and develop strategies to address psychosocial issues Identify diabetes complications and ways to prevent them Explain how to manage diabetes during illness Evaluate success in meeting personal goal Establish 2-3 goals that they will plan to diligently work on  Plan:  Attend Support Group as desired

## 2023-12-11 ENCOUNTER — Encounter: Payer: Self-pay | Admitting: Family Medicine

## 2024-01-24 ENCOUNTER — Ambulatory Visit (INDEPENDENT_AMBULATORY_CARE_PROVIDER_SITE_OTHER): Payer: Medicare Other

## 2024-01-24 VITALS — BP 156/57 | Ht 71.0 in | Wt 202.0 lb

## 2024-01-24 DIAGNOSIS — Z0000 Encounter for general adult medical examination without abnormal findings: Secondary | ICD-10-CM | POA: Diagnosis not present

## 2024-01-24 NOTE — Progress Notes (Signed)
 Because this visit was a virtual/telehealth visit,  certain criteria was not obtained, such a blood pressure, CBG if applicable, and timed get up and go. Any medications not marked as "taking" were not mentioned during the medication reconciliation part of the visit. Any vitals not documented were not able to be obtained due to this being a telehealth visit or patient was unable to self-report a recent blood pressure reading due to a lack of equipment at home via telehealth. Vitals that have been documented are verbally provided by the patient.   Subjective:   Frank Hamilton is a 83 y.o. who presents for a Medicare Wellness preventive visit.  Visit Complete: Virtual I connected with  Frank Hamilton on 01/24/24 by a audio enabled telemedicine application and verified that I am speaking with the correct person using two identifiers.  Patient Location: Home  Provider Location: Home Office  I discussed the limitations of evaluation and management by telemedicine. The patient expressed understanding and agreed to proceed.  Vital Signs: Because this visit was a virtual/telehealth visit, some criteria may be missing or patient reported. Any vitals not documented were not able to be obtained and vitals that have been documented are patient reported.  VideoDeclined- This patient declined Librarian, academic. Therefore the visit was completed with audio only.  AWV Questionnaire: No: Patient Medicare AWV questionnaire was not completed prior to this visit.  Cardiac Risk Factors include: advanced age (>80men, >65 women);diabetes mellitus;dyslipidemia;hypertension;male gender     Objective:    Today's Vitals   01/24/24 1042  BP: (!) 156/57  SpO2: 96%  Weight: 202 lb (91.6 kg)  Height: 5\' 11"  (1.803 m)   Body mass index is 28.17 kg/m.     11/22/2023   12:15 PM 01/19/2023   10:36 AM 01/17/2022   11:13 AM 02/10/2021   10:17 AM 01/11/2021   11:55 AM 01/09/2020   11:34  AM 01/02/2019   11:50 AM  Advanced Directives  Does Patient Have a Medical Advance Directive? No No No No No No No  Would patient like information on creating a medical advance directive? No - Patient declined  No - Patient declined No - Patient declined No - Patient declined Yes (MAU/Ambulatory/Procedural Areas - Information given) No - Patient declined    Current Medications (verified) Outpatient Encounter Medications as of 01/24/2024  Medication Sig   amLODipine-valsartan (EXFORGE) 10-320 MG tablet Take 1 tablet by mouth daily. In place of amlodipine, valsartan hydrochlorothiazide   aspirin 81 MG chewable tablet Chew 81 mg by mouth.   atorvastatin (LIPITOR) 40 MG tablet Take 1 tablet (40 mg total) by mouth daily.   Cholecalciferol (VITAMIN D) 2000 units CAPS Take 1 capsule (2,000 Units total) by mouth daily.   empagliflozin (JARDIANCE) 25 MG TABS tablet Take 1 tablet (25 mg total) by mouth daily before breakfast. For DM and macro albuminuria   glucosamine-chondroitin 500-400 MG tablet Take 1 tablet by mouth in the morning and at bedtime.    latanoprost (XALATAN) 0.005 % ophthalmic solution SMARTSIG:1 Drop(s) In Eye(s) Every Evening   omega-3 acid ethyl esters (LOVAZA) 1 g capsule Take 2 capsules (2 g total) by mouth 2 (two) times daily.   pregabalin (LYRICA) 25 MG capsule Take 1 capsule (25 mg total) by mouth 2 (two) times daily.   No facility-administered encounter medications on file as of 01/24/2024.    Allergies (verified) Gabapentin and Sulfa antibiotics   History: Past Medical History:  Diagnosis Date   Hyperlipidemia  Hypertension    Kidney cysts    Osteoarthritis    PVD (peripheral vascular disease) (HCC)    Vitamin D deficiency    Past Surgical History:  Procedure Laterality Date   bowel obstruction  02/01/2010   CATARACT EXTRACTION, BILATERAL  02/02/2016   HERNIA REPAIR  02-02-08 and 02/01/2009   TONSILLECTOMY     age 39 or 66   Family History  Problem Relation Age of Onset   Heart  disease Mother    Stroke Father    Heart disease Sister    Diabetes Sister    Diabetes Maternal Grandfather    Social History   Socioeconomic History   Marital status: Widowed    Spouse name: Not on file   Number of children: 3   Years of education: Not on file   Highest education level: High school graduate  Occupational History   Occupation: retired  Tobacco Use   Smoking status: Former    Current packs/day: 0.00    Average packs/day: 1 pack/day for 20.0 years (20.0 ttl pk-yrs)    Types: Cigarettes    Start date: 06/08/1954    Quit date: 06/08/1974    Years since quitting: 49.6   Smokeless tobacco: Never  Vaping Use   Vaping status: Never Used  Substance and Sexual Activity   Alcohol use: Yes    Alcohol/week: 1.0 standard drink of alcohol    Types: 1 Cans of beer per week    Comment: social   Drug use: No   Sexual activity: Not Currently  Other Topics Concern   Not on file  Social History Narrative   He was married for 48 years , wife died in 02/02/16. Lives with his dog   Social Drivers of Health   Financial Resource Strain: Low Risk  (01/24/2024)   Overall Financial Resource Strain (CARDIA)    Difficulty of Paying Living Expenses: Not hard at all  Food Insecurity: No Food Insecurity (01/24/2024)   Hunger Vital Sign    Worried About Running Out of Food in the Last Year: Never true    Ran Out of Food in the Last Year: Never true  Transportation Needs: No Transportation Needs (01/24/2024)   PRAPARE - Administrator, Civil Service (Medical): No    Lack of Transportation (Non-Medical): No  Physical Activity: Sufficiently Active (01/24/2024)   Exercise Vital Sign    Days of Exercise per Week: 7 days    Minutes of Exercise per Session: 30 min  Stress: No Stress Concern Present (01/24/2024)   [employer] of Occupational Health - Occupational Stress Questionnaire    Feeling of Stress : Not at all  Social Connections: Moderately Isolated (01/24/2024)    Social Connection and Isolation Panel [NHANES]    Frequency of Communication with Friends and Family: More than three times a week    Frequency of Social Gatherings with Friends and Family: Twice a week    Attends Religious Services: More than 4 times per year    Active Member of [employer] or Organizations: No    Attends Banker Meetings: Never    Marital Status: Widowed    Tobacco Counseling Counseling given: Yes    Clinical Intake:  Pre-visit preparation completed: Yes  Pain : No/denies pain     BMI - recorded: 28.17 Nutritional Status: BMI 25 -29 Overweight Nutritional Risks: None Diabetes: Yes (telehealth visit) CBG done?: No Did pt. bring in CBG monitor from home?: No  How often do you need  to have someone help you when you read instructions, pamphlets, or other written materials from your doctor or pharmacy?: 1 - Never  Interpreter Needed?: No  Information entered by :: Maryjean Ka, CMA   Activities of Daily Living     01/24/2024   10:44 AM 09/28/2023    1:58 PM  In your present state of health, do you have any difficulty performing the following activities:  Hearing? 1 1  Comment had hearing tested. needs hearing aids, but has not gotten them yet.   Vision? 0 0  Difficulty concentrating or making decisions? 0 0  Walking or climbing stairs? 0 1  Dressing or bathing? 0 0  Doing errands, shopping? 0 0  Preparing Food and eating ? N   Using the Toilet? N   In the past six months, have you accidently leaked urine? N   Do you have problems with loss of bowel control? N   Managing your Medications? N   Managing your Finances? N   Housekeeping or managing your Housekeeping? N     Patient Care Team: Alba Cory, MD as PCP - General (Family Medicine) Lamont Dowdy, MD as Consulting Physician (Nephrology) Deirdre Evener, MD (Dermatology) Debbe Odea, MD as Consulting Physician (Cardiology)  Indicate any recent Medical Services you may  have received from other than [facility] providers in the past year (date may be approximate).     Assessment:   This is a routine wellness examination for Bentlie.  Hearing/Vision screen Hearing Screening - Comments:: Patient complains of difficulty with hearing. Patient declines referral today  Vision Screening - Comments:: Wears rx glasses - up to date with routine eye exams  Sees Dr. Alonna Buckler   Goals Addressed             This Visit's Progress    Patient Stated       To be able to walk better and get my tractor put back together       Depression Screen     01/24/2024   10:52 AM 11/22/2023   12:15 PM 09/28/2023    1:58 PM 06/13/2023   11:00 AM 01/19/2023   10:31 AM 09/08/2022   11:10 AM 03/10/2022   11:03 AM  PHQ 2/9 Scores  PHQ - 2 Score 0 0 0 0 0 0 0  PHQ- 9 Score 5  0 0  0 2    Fall Risk     01/24/2024   10:47 AM 11/22/2023   12:15 PM 09/28/2023    1:58 PM 06/13/2023   11:00 AM 01/19/2023   10:25 AM  Fall Risk   Falls in the past year? 0 0 0 0 0  Number falls in past yr: 0    0  Injury with Fall? 0    0  Risk for fall due to : No Fall Risks  No Fall Risks No Fall Risks No Fall Risks  Follow up Falls prevention discussed;Falls evaluation completed  Falls prevention discussed Falls prevention discussed Falls prevention discussed;Education provided    MEDICARE RISK AT HOME:  Medicare Risk at Home Any stairs in or around the home?: Yes If so, are there any without handrails?: Yes Home free of loose throw rugs in walkways, pet beds, electrical cords, etc?: Yes Adequate lighting in your home to reduce risk of falls?: Yes Life alert?: No Use of a cane, walker or w/c?: No Grab bars in the bathroom?: No Shower chair or bench in shower?: No Elevated toilet seat or a  handicapped toilet?: Yes  TIMED UP AND GO:  Was the test performed?  No  Cognitive Function: 6CIT completed        01/24/2024   10:48 AM 01/19/2023   10:40 AM 01/09/2020   11:39 AM 01/02/2019   11:58 AM   6CIT Screen  What Year? 0 points 0 points 0 points 0 points  What month? 0 points 0 points 0 points 0 points  What time? 0 points 0 points 0 points 0 points  Count back from 20 0 points 0 points 0 points 0 points  Months in reverse 0 points 0 points 0 points 0 points  Repeat phrase 0 points 0 points 0 points 0 points  Total Score 0 points 0 points 0 points 0 points    Immunizations Immunization History  Administered Date(s) Administered   Fluad Quad(high Dose 65+) 09/08/2019, 09/08/2022   Fluad Trivalent(High Dose 65+) 09/28/2023   Influenza, High Dose Seasonal PF 11/15/2016, 09/27/2017, 10/04/2018, 11/15/2020, 09/09/2021   Influenza,inj,Quad PF,6+ Mos 08/03/2015   Influenza-Unspecified 09/13/2013   PFIZER(Purple Top)SARS-COV-2 Vaccination 12/18/2019, 01/08/2020, 09/25/2020   PNEUMOCOCCAL CONJUGATE-20 01/17/2022   Pneumococcal Conjugate-13 04/01/2018   Tdap 08/15/2011, 09/09/2021    Screening Tests Health Maintenance  Topic Date Due   Zoster Vaccines- Shingrix (1 of 2) Never done   COVID-19 Vaccine (4 - 2024-25 season) 07/15/2023   Diabetic kidney evaluation - eGFR measurement  06/12/2024   Diabetic kidney evaluation - Urine ACR  10/23/2024   Medicare Annual Wellness (AWV)  01/23/2025   DTaP/Tdap/Td (3 - Td or Tdap) 09/10/2031   Pneumonia Vaccine 37+ Years old  Completed   INFLUENZA VACCINE  Completed   HPV VACCINES  Aged Out   Hepatitis C Screening  Discontinued    Health Maintenance  Health Maintenance Due  Topic Date Due   Zoster Vaccines- Shingrix (1 of 2) Never done   COVID-19 Vaccine (4 - 2024-25 season) 07/15/2023   Health Maintenance Items Addressed: Patient declined vaccines  Additional Screening:  Vision Screening: Recommended annual ophthalmology exams for early detection of glaucoma and other disorders of the eye.  Dental Screening: Recommended annual dental exams for proper oral hygiene  Community Resource Referral / Chronic Care Management: CRR  required this visit?  No   CCM required this visit?  No     Plan:     I have personally reviewed and noted the following in the patient's chart:   Medical and social history Use of alcohol, tobacco or illicit drugs  Current medications and supplements including opioid prescriptions. Patient is not currently taking opioid prescriptions. Functional ability and status Nutritional status Physical activity Advanced directives List of other physicians Hospitalizations, surgeries, and ER visits in previous 12 months Vitals Screenings to include cognitive, depression, and falls Referrals and appointments  In addition, I have reviewed and discussed with patient certain preventive protocols, quality metrics, and best practice recommendations. A written personalized care plan for preventive services as well as general preventive health recommendations were provided to patient.     Jordan Hawks Octavia Mottola, CMA   01/24/2024   After Visit Summary: (Mail) Due to this being a telephonic visit, the after visit summary with patients personalized plan was offered to patient via mail   Notes: Please refer to Routing Comments.

## 2024-01-24 NOTE — Patient Instructions (Signed)
 Frank Hamilton , Thank you for taking time to come for your Medicare Wellness Visit. I appreciate your ongoing commitment to your health goals. Please review the following plan we discussed and let me know if I can assist you in the future.   Referrals/Orders/Follow-Ups/Clinician Recommendations:  Next Medicare Annual Wellness Visit:   January 26, 2025 at 1:00 pm telephone visit  This is a list of the screening recommended for you and due dates:  Health Maintenance  Topic Date Due   Zoster (Shingles) Vaccine (1 of 2) Never done   COVID-19 Vaccine (4 - 2024-25 season) 07/15/2023   Yearly kidney function blood test for diabetes  06/12/2024   Yearly kidney health urinalysis for diabetes  10/23/2024   Medicare Annual Wellness Visit  01/23/2025   DTaP/Tdap/Td vaccine (3 - Td or Tdap) 09/10/2031   Pneumonia Vaccine  Completed   Flu Shot  Completed   HPV Vaccine  Aged Out   Hepatitis C Screening  Discontinued    Advanced directives: (Declined) Advance directive discussed with you today. Even though you declined this today, please call our office should you change your mind, and we can give you the proper paperwork for you to fill out.  Next Medicare Annual Wellness Visit scheduled for next year: yes  Understanding Your Risk for Falls Millions of people have serious injuries from falls each year. It is important to understand your risk of falling. Talk with your health care provider about your risk and what you can do to lower it. If you do have a serious fall, make sure to tell your provider. Falling once raises your risk of falling again. How can falls affect me? Serious injuries from falls are common. These include: Broken bones, such as hip fractures. Head injuries, such as traumatic brain injuries (TBI) or concussions. A fear of falling can cause you to avoid activities and stay at home. This can make your muscles weaker and raise your risk for a fall. What can increase my risk? There are  a number of risk factors that increase your risk for falling. The more risk factors you have, the higher your risk of falling. Serious injuries from a fall happen most often to people who are older than 81 years old. Teenagers and young adults ages 34-29 are also at higher risk. Common risk factors include: Weakness in the lower body. Being generally weak or confused due to long-term (chronic) illness. Dizziness or balance problems. Poor vision. Medicines that cause dizziness or drowsiness. These may include: Medicines for your blood pressure, heart, anxiety, insomnia, or swelling (edema). Pain medicines. Muscle relaxants. Other risk factors include: Drinking alcohol. Having had a fall in the past. Having foot pain or wearing improper footwear. Working at a dangerous job. Having any of the following in your home: Tripping hazards, such as floor clutter or loose rugs. Poor lighting. Pets. Having dementia or memory loss. What actions can I take to lower my risk of falling?     Physical activity Stay physically fit. Do strength and balance exercises. Consider taking a regular class to build strength and balance. Yoga and tai chi are good options. Vision Have your eyes checked every year and your prescription for glasses or contacts updated as needed. Shoes and walking aids Wear non-skid shoes. Wear shoes that have rubber soles and low heels. Do not wear high heels. Do not walk around the house in socks or slippers. Use a cane or walker as told by your provider. Home safety Attach secure  railings on both sides of your stairs. Install grab bars for your bathtub, shower, and toilet. Use a non-skid mat in your bathtub or shower. Attach bath mats securely with double-sided, non-slip rug tape. Use good lighting in all rooms. Keep a flashlight near your bed. Make sure there is a clear path from your bed to the bathroom. Use night-lights. Do not use throw rugs. Make sure all carpeting is  taped or tacked down securely. Remove all clutter from walkways and stairways, including extension cords. Repair uneven or broken steps and floors. Avoid walking on icy or slippery surfaces. Walk on the grass instead of on icy or slick sidewalks. Use ice melter to get rid of ice on walkways in the winter. Use a cordless phone. Questions to ask your health care provider Can you help me check my risk for a fall? Do any of my medicines make me more likely to fall? Should I take a vitamin D supplement? What exercises can I do to improve my strength and balance? Should I make an appointment to have my vision checked? Do I need a bone density test to check for weak bones (osteoporosis)? Would it help to use a cane or a walker? Where to find more information Centers for Disease Control and Prevention, STEADI: TonerPromos.no Community-Based Fall Prevention Programs: TonerPromos.no [employer] on Aging: BaseRingTones.pl Contact a health care provider if: You fall at home. You are afraid of falling at home. You feel weak, drowsy, or dizzy. This information is not intended to replace advice given to you by your health care provider. Make sure you discuss any questions you have with your health care provider. Document Revised: 07/03/2022 Document Reviewed: 07/03/2022 Elsevier Patient Education  2024 [employer].

## 2024-01-30 ENCOUNTER — Ambulatory Visit: Payer: Medicare Other | Admitting: Family Medicine

## 2024-01-30 ENCOUNTER — Encounter: Payer: Self-pay | Admitting: Family Medicine

## 2024-01-30 VITALS — BP 136/66 | HR 61 | Resp 16 | Ht 71.0 in | Wt 204.4 lb

## 2024-01-30 DIAGNOSIS — I152 Hypertension secondary to endocrine disorders: Secondary | ICD-10-CM | POA: Diagnosis not present

## 2024-01-30 DIAGNOSIS — I10 Essential (primary) hypertension: Secondary | ICD-10-CM

## 2024-01-30 DIAGNOSIS — E1159 Type 2 diabetes mellitus with other circulatory complications: Secondary | ICD-10-CM

## 2024-01-30 MED ORDER — AMLODIPINE BESYLATE-VALSARTAN 10-320 MG PO TABS: 1.0000 | ORAL_TABLET | Freq: Every day | ORAL | 0 refills | Status: DC

## 2024-01-30 MED ORDER — HYDROCHLOROTHIAZIDE 12.5 MG PO TABS: 12.5000 mg | ORAL_TABLET | Freq: Every day | ORAL | 3 refills | Status: DC

## 2024-01-30 MED ORDER — HYDROCHLOROTHIAZIDE 12.5 MG PO TABS: 12.5000 mg | ORAL_TABLET | Freq: Every day | ORAL | 0 refills | Status: DC

## 2024-01-30 NOTE — Progress Notes (Addendum)
 Name: Frank Hamilton   MRN: [id number]    DOB: [birth year]   Date:01/30/2024       Progress Note  Subjective  Chief Complaint  Chief Complaint  Patient presents with   Medical Management of Chronic Issues   HPI   Uncontrolled HTN: he was taking Exforge hydrochlorothiazide 10/320/12.5 mg but it had to be changed due to cost and now BP is elevated, we will resume hydrochlorothiazide. He denies any previous side effects. No chest pain, palpitation or sob.    Patient Active Problem List   Diagnosis Date Noted   Exposure to Agent [facility] 09/08/2022   Aortic stenosis, mild 09/08/2022   Bilateral hearing loss 09/08/2022   Tinnitus of both ears 09/08/2022   Spinal stenosis, lumbar region, with neurogenic claudication 02/10/2021   Chronic radicular lumbar pain 02/10/2021   Lumbar spondylosis 02/10/2021   DDD (degenerative disc disease), lumbar 02/10/2021   Chronic pain syndrome 02/10/2021   Positive for macroalbuminuria 08/06/2018   PVD (peripheral vascular disease) with claudication (HCC) 07/02/2018   Senile purpura (HCC) 07/02/2018   Claudication (HCC) 04/23/2018   B12 deficiency 05/02/2016   Vitamin D deficiency 05/02/2016   Osteoarthritis of both hands 05/01/2016   Essential hypertension 08/03/2015   Dyslipidemia 08/03/2015   Kidney cysts 12/15/2013    Past Surgical History:  Procedure Laterality Date   bowel obstruction  2011   CATARACT EXTRACTION, BILATERAL  2017   HERNIA REPAIR  2009 and 2010   TONSILLECTOMY     age 83 or 63    Family History  Problem Relation Age of Onset   Heart disease Mother    Stroke Father    Heart disease Sister    Diabetes Sister    Diabetes Maternal Grandfather     Social History   Tobacco Use   Smoking status: Former    Current packs/day: 0.00    Average packs/day: 1 pack/day for 20.0 years (20.0 ttl pk-yrs)    Types: Cigarettes    Start date: 06/08/1954    Quit date: 06/08/1974    Years since quitting: 49.6   Smokeless tobacco:  Never  Substance Use Topics   Alcohol use: Yes    Alcohol/week: 1.0 standard drink of alcohol    Types: 1 Cans of beer per week    Comment: social     Current Outpatient Medications:    amLODipine-valsartan (EXFORGE) 10-320 MG tablet, Take 1 tablet by mouth daily. In place of amlodipine, valsartan hydrochlorothiazide, Disp: 90 tablet, Rfl: 0   aspirin 81 MG chewable tablet, Chew 81 mg by mouth., Disp: , Rfl:    atorvastatin (LIPITOR) 40 MG tablet, Take 1 tablet (40 mg total) by mouth daily., Disp: 90 tablet, Rfl: 1   Cholecalciferol (VITAMIN D) 2000 units CAPS, Take 1 capsule (2,000 Units total) by mouth daily., Disp: 30 capsule, Rfl:    empagliflozin (JARDIANCE) 25 MG TABS tablet, Take 1 tablet (25 mg total) by mouth daily before breakfast. For DM and macro albuminuria, Disp: 90 tablet, Rfl: 0   glucosamine-chondroitin 500-400 MG tablet, Take 1 tablet by mouth in the morning and at bedtime. , Disp: , Rfl:    latanoprost (XALATAN) 0.005 % ophthalmic solution, SMARTSIG:1 Drop(s) In Eye(s) Every Evening, Disp: , Rfl:    omega-3 acid ethyl esters (LOVAZA) 1 g capsule, Take 2 capsules (2 g total) by mouth 2 (two) times daily., Disp: 360 capsule, Rfl: 1   pregabalin (LYRICA) 25 MG capsule, Take 1 capsule (25 mg total) by mouth 2 (  two) times daily., Disp: 180 capsule, Rfl: 0  Allergies  Allergen Reactions   Gabapentin Rash    Headache also reported   Sulfa Antibiotics Itching and Rash      ROS  Ten systems reviewed and is negative except as mentioned in HPI    Objective  Vitals:   01/30/24 1047  BP: (!) 150/64  Pulse: 61  Resp: 16  SpO2: 96%  Weight: 204 lb 6.4 oz (92.7 kg)  Height: 5\' 11"  (1.803 m)    Body mass index is 28.51 kg/m.  Physical Exam  Constitutional: Patient appears well-developed and well-nourished.  No distress.  HEENT: head atraumatic, normocephalic, pupils equal and reactive to light, neck supple Cardiovascular: Normal rate, regular rhythm and normal  heart sounds.  3/6 SEM No BLE edema. Pulmonary/Chest: Effort normal and breath sounds normal. No respiratory distress. Abdominal: Soft.  There is no tenderness. Psychiatric: Patient has a normal mood and affect. behavior is normal. Judgment and thought content normal.   Recent Results (from the past 2160 hours)  HM DIABETES EYE EXAM     Status: None   Collection Time: 12/06/23  9:33 AM  Result Value Ref Range   HM Diabetic Eye Exam No Retinopathy No Retinopathy    Comment: Abs by HIM    Diabetic Foot Exam:     PHQ2/9:    01/24/2024   10:52 AM 11/22/2023   12:15 PM 09/28/2023    1:58 PM 06/13/2023   11:00 AM 01/19/2023   10:31 AM  Depression screen PHQ 2/9  Decreased Interest 0 0 0 0 0  Down, Depressed, Hopeless 0 0 0 0 0  PHQ - 2 Score 0 0 0 0 0  Altered sleeping 2  0 0   Tired, decreased energy 3  0 0   Change in appetite 0  0 0   Feeling bad or failure about yourself  0  0 0   Trouble concentrating 0  0 0   Moving slowly or fidgety/restless 0  0 0   Suicidal thoughts 0  0 0   PHQ-9 Score 5  0 0   Difficult doing work/chores Not difficult at all        phq 9 is negative   Fall Risk:     01/24/2024   10:47 AM 11/22/2023   12:15 PM 09/28/2023    1:58 PM 06/13/2023   11:00 AM 01/19/2023   10:25 AM  Fall Risk   Falls in the past year? 0 0 0 0 0  Number falls in past yr: 0    0  Injury with Fall? 0    0  Risk for fall due to : No Fall Risks  No Fall Risks No Fall Risks No Fall Risks  Follow up Falls prevention discussed;Falls evaluation completed  Falls prevention discussed Falls prevention discussed Falls prevention discussed;Education provided     Assessment & Plan  1. Uncontrolled hypertension (Primary)  - hydrochlorothiazide (HYDRODIURIL) 12.5 MG tablet; Take 1 tablet (12.5 mg total) by mouth daily.  Dispense: 90 tablet; Refill: 0  2. Hypertension associated with type 2 diabetes mellitus (HCC)  - amLODipine-valsartan (EXFORGE) 10-320 MG tablet; Take 1 tablet by  mouth daily. In place of amlodipine, valsartan hydrochlorothiazide  Dispense: 90 tablet; Refill: 0

## 2024-03-02 ENCOUNTER — Other Ambulatory Visit: Payer: Self-pay | Admitting: Family Medicine

## 2024-03-02 DIAGNOSIS — R809 Proteinuria, unspecified: Secondary | ICD-10-CM

## 2024-03-02 DIAGNOSIS — I152 Hypertension secondary to endocrine disorders: Secondary | ICD-10-CM

## 2024-03-11 ENCOUNTER — Encounter: Payer: Self-pay | Admitting: Family Medicine

## 2024-03-11 ENCOUNTER — Ambulatory Visit (INDEPENDENT_AMBULATORY_CARE_PROVIDER_SITE_OTHER): Admitting: Family Medicine

## 2024-03-11 VITALS — BP 118/76 | HR 60 | Resp 16 | Ht 71.0 in | Wt 203.3 lb

## 2024-03-11 DIAGNOSIS — I350 Nonrheumatic aortic (valve) stenosis: Secondary | ICD-10-CM

## 2024-03-11 DIAGNOSIS — E1169 Type 2 diabetes mellitus with other specified complication: Secondary | ICD-10-CM

## 2024-03-11 DIAGNOSIS — M19042 Primary osteoarthritis, left hand: Secondary | ICD-10-CM

## 2024-03-11 DIAGNOSIS — M19041 Primary osteoarthritis, right hand: Secondary | ICD-10-CM

## 2024-03-11 DIAGNOSIS — I152 Hypertension secondary to endocrine disorders: Secondary | ICD-10-CM

## 2024-03-11 DIAGNOSIS — E1129 Type 2 diabetes mellitus with other diabetic kidney complication: Secondary | ICD-10-CM

## 2024-03-11 DIAGNOSIS — I739 Peripheral vascular disease, unspecified: Secondary | ICD-10-CM

## 2024-03-11 DIAGNOSIS — M545 Low back pain, unspecified: Secondary | ICD-10-CM

## 2024-03-11 DIAGNOSIS — E1159 Type 2 diabetes mellitus with other circulatory complications: Secondary | ICD-10-CM

## 2024-03-11 DIAGNOSIS — E538 Deficiency of other specified B group vitamins: Secondary | ICD-10-CM

## 2024-03-11 DIAGNOSIS — Z7984 Long term (current) use of oral hypoglycemic drugs: Secondary | ICD-10-CM

## 2024-03-11 DIAGNOSIS — E785 Hyperlipidemia, unspecified: Secondary | ICD-10-CM

## 2024-03-11 LAB — POCT GLYCOSYLATED HEMOGLOBIN (HGB A1C): Hemoglobin A1C: 6.2 % — AB (ref 4.0–5.6)

## 2024-03-11 MED ORDER — AMLODIPINE BESYLATE-VALSARTAN 10-320 MG PO TABS: 1.0000 | ORAL_TABLET | Freq: Every day | ORAL | 1 refills | Status: DC

## 2024-03-11 MED ORDER — HYDROCHLOROTHIAZIDE 12.5 MG PO TABS: 12.5000 mg | ORAL_TABLET | Freq: Every day | ORAL | 1 refills | Status: DC

## 2024-03-11 MED ORDER — PREGABALIN 25 MG PO CAPS: 25.0000 mg | ORAL_CAPSULE | Freq: Every evening | ORAL | 0 refills | Status: AC | PRN

## 2024-03-11 MED ORDER — FENOFIBRATE 145 MG PO TABS: 145.0000 mg | ORAL_TABLET | Freq: Every day | ORAL | 1 refills | Status: DC

## 2024-03-11 MED ORDER — EMPAGLIFLOZIN 25 MG PO TABS: 25.0000 mg | ORAL_TABLET | Freq: Every day | ORAL | 1 refills | Status: DC

## 2024-03-11 MED ORDER — ATORVASTATIN CALCIUM 40 MG PO TABS
40.0000 mg | ORAL_TABLET | Freq: Every day | ORAL | 1 refills | Status: DC
Start: 2024-03-11 — End: 2024-09-17

## 2024-03-11 NOTE — Progress Notes (Signed)
 Name: Frank Hamilton   MRN: [id number]    DOB: 24-Feb-1941   Date:03/11/2024       Progress Note  Subjective  Chief Complaint  Chief Complaint  Patient presents with   Medical Management of Chronic Issues    DM/HTN   Discussed the use of AI scribe software for clinical note transcription with the patient, who gave verbal consent to proceed.  History of Present Illness Frank Hamilton is an 83 year old male with diabetes and dyslipidemia who presents for a regular follow-up visit.  Diabetes has been well controlled. He mentions that his Jardiance  ran out a day or two ago. He feels hungry at times but does not experience shakiness. No unusual thirst or excessive urination, although he notes frequent urination in the morning due to taking his water pill and Jardiance  in the morning.  For dyslipidemia, he is taking atorvastatin  daily without issues. He previously took Lovaza  but switched to an over-the-counter alternative after insurance stopped covering it.   He has low back pain and has been prescribed pregabalin , which he takes around dinner time. He experiences lingering drowsiness into the next day, leading him to skip doses occasionally. His back pain has improved and is not currently significant, allowing him to sleep better. He sometimes skips pregabalin  for two to three days without issues.  He has peripheral vascular disease with claudication  He feels slow and experiences leg heaviness and tiredness, which previously required him to stop walking. He has increased his walking activity despite balance issues and weakness in his legs. He does not wish to pursue physical therapy at this time.  No dizziness or lightheadedness when standing up. No issues with his current blood pressure medication regimen, his blood pressure today is towards low end of normal , we will monitor and advised to stay hydrated .    Patient Active Problem List   Diagnosis Date Noted   Exposure to Agent [facility]  09/08/2022   Aortic stenosis, mild 09/08/2022   Bilateral hearing loss 09/08/2022   Tinnitus of both ears 09/08/2022   Spinal stenosis, lumbar region, with neurogenic claudication 02/10/2021   Chronic radicular lumbar pain 02/10/2021   Lumbar spondylosis 02/10/2021   DDD (degenerative disc disease), lumbar 02/10/2021   Chronic pain syndrome 02/10/2021   Positive for macroalbuminuria 08/06/2018   PVD (peripheral vascular disease) with claudication (HCC) 07/02/2018   Senile purpura (HCC) 07/02/2018   Claudication (HCC) 04/23/2018   B12 deficiency 05/02/2016   Vitamin D  deficiency 05/02/2016   Osteoarthritis of both hands 05/01/2016   Essential hypertension 08/03/2015   Dyslipidemia 08/03/2015   Kidney cysts 12/15/2013    Past Surgical History:  Procedure Laterality Date   bowel obstruction  2011   CATARACT EXTRACTION, BILATERAL  2017   HERNIA REPAIR  2009 and 2010   TONSILLECTOMY     age 13 or 30    Family History  Problem Relation Age of Onset   Heart disease Mother    Stroke Father    Heart disease Sister    Diabetes Sister    Diabetes Maternal Grandfather     Social History   Tobacco Use   Smoking status: Former    Current packs/day: 0.00    Average packs/day: 1 pack/day for 20.0 years (20.0 ttl pk-yrs)    Types: Cigarettes    Start date: 06/08/1954    Quit date: 06/08/1974    Years since quitting: 49.7   Smokeless tobacco: Never  Substance Use Topics  Alcohol use: Yes    Alcohol/week: 1.0 standard drink of alcohol    Types: 1 Cans of beer per week    Comment: social     Current Outpatient Medications:    amLODipine -valsartan  (EXFORGE ) 10-320 MG tablet, Take 1 tablet by mouth daily. In place of amlodipine , valsartan  hydrochlorothiazide , Disp: 90 tablet, Rfl: 0   aspirin 81 MG chewable tablet, Chew 81 mg by mouth., Disp: , Rfl:    atorvastatin  (LIPITOR) 40 MG tablet, Take 1 tablet (40 mg total) by mouth daily., Disp: 90 tablet, Rfl: 1   Cholecalciferol  (VITAMIN D ) 2000 units CAPS, Take 1 capsule (2,000 Units total) by mouth daily., Disp: 30 capsule, Rfl:    glucosamine-chondroitin 500-400 MG tablet, Take 1 tablet by mouth in the morning and at bedtime. , Disp: , Rfl:    hydrochlorothiazide  (HYDRODIURIL ) 12.5 MG tablet, Take 1 tablet (12.5 mg total) by mouth daily., Disp: 90 tablet, Rfl: 0   JARDIANCE  25 MG TABS tablet, TAKE 1 TABLET BY MOUTH ONCE DAILY BEFORE BREAKFAST FOR  DM  AND  MACRO  ALBUMINURIA, Disp: 90 tablet, Rfl: 0   latanoprost (XALATAN) 0.005 % ophthalmic solution, SMARTSIG:1 Drop(s) In Eye(s) Every Evening, Disp: , Rfl:    omega-3 acid ethyl esters (LOVAZA ) 1 g capsule, Take 2 capsules (2 g total) by mouth 2 (two) times daily., Disp: 360 capsule, Rfl: 1   pregabalin  (LYRICA ) 25 MG capsule, Take 1 capsule (25 mg total) by mouth 2 (two) times daily., Disp: 180 capsule, Rfl: 0  Allergies  Allergen Reactions   Gabapentin  Rash    Headache also reported   Sulfa Antibiotics Itching and Rash    I personally reviewed active problem list, medication list, allergies, family history with the patient/caregiver today.   ROS  Ten systems reviewed and is negative except as mentioned in HPI    Objective Physical Exam  CONSTITUTIONAL: Patient appears well-developed and well-nourished. No distress. HEENT: Head atraumatic, normocephalic, neck supple. CARDIOVASCULAR: Normal rate, regular rhythm and normal heart sounds. No murmur heard. No BLE edema. PULMONARY: Effort normal and breath sounds normal. Lungs clear to auscultation. No respiratory distress. ABDOMINAL: There is no tenderness or distention. PSYCHIATRIC: Patient has a normal mood and affect. Behavior is normal. Judgment and thought content normal.  Vitals:   03/11/24 1103  BP: 118/76  Pulse: 60  Resp: 16  SpO2: 97%  Weight: 203 lb 4.8 oz (92.2 kg)  Height: 5\' 11"  (1.803 m)    Body mass index is 28.35 kg/m.  Recent Results (from the past 2160 hours)  POCT glycosylated  hemoglobin (Hb A1C)     Status: Abnormal   Collection Time: 03/11/24 11:08 AM  Result Value Ref Range   Hemoglobin A1C 6.2 (A) 4.0 - 5.6 %   HbA1c POC (<> result, manual entry)     HbA1c, POC (prediabetic range)     HbA1c, POC (controlled diabetic range)      Diabetic Foot Exam:     PHQ2/9:    03/11/2024   11:04 AM 01/24/2024   10:52 AM 11/22/2023   12:15 PM 09/28/2023    1:58 PM 06/13/2023   11:00 AM  Depression screen PHQ 2/9  Decreased Interest 0 0 0 0 0  Down, Depressed, Hopeless 0 0 0 0 0  PHQ - 2 Score 0 0 0 0 0  Altered sleeping 0 2  0 0  Tired, decreased energy 0 3  0 0  Change in appetite 0 0  0 0  Feeling  bad or failure about yourself  0 0  0 0  Trouble concentrating 0 0  0 0  Moving slowly or fidgety/restless 0 0  0 0  Suicidal thoughts 0 0  0 0  PHQ-9 Score 0 5  0 0  Difficult doing work/chores Not difficult at all Not difficult at all       phq 9 is negative  Fall Risk:    01/24/2024   10:47 AM 11/22/2023   12:15 PM 09/28/2023    1:58 PM 06/13/2023   11:00 AM 01/19/2023   10:25 AM  Fall Risk   Falls in the past year? 0 0 0 0 0  Number falls in past yr: 0    0  Injury with Fall? 0    0  Risk for fall due to : No Fall Risks  No Fall Risks No Fall Risks No Fall Risks  Follow up Falls prevention discussed;Falls evaluation completed  Falls prevention discussed Falls prevention discussed Falls prevention discussed;Education provided    Assessment & Plan Peripheral vascular disease with claudication Leg pain improved with increased walking. Declined physical therapy. Recommended walking and water activities to enhance vascular health and collateral circulation. - Encourage regular walking. - Suggest water activities like water aerobics or swimming.  Hypertension, well-controlled Blood pressure controlled at 118/76. Advised monitoring for hypotension symptoms, especially in summer. - Monitor blood pressure regularly. - Report dizziness or lightheadedness,  especially when standing. - Adjust medication if hypotension symptoms occur.  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 No hypoglycemia symptoms. Jardiance  aids kidney function and diabetes management. - Provide refills for Jardiance . - Carry snacks to prevent hypoglycemia. - Continue monitoring blood glucose levels.  Dyslipidemia Lovaza  discontinued due to coverage. Atorvastatin  continued. Fenofibrate  preferred for cost and efficacy. - Discontinue Lovaza . - Prescribe fenofibrate . - Continue atorvastatin .  Mild aortic stenosis Found on Echo  Low back pain Pain improved. Pregabalin  caused drowsiness. Tylenol recommended for safety. - Prescribe pregabalin  as needed for flare-ups. - Advise taking Tylenol nightly. - Encourage alternating pregabalin  with Tylenol if pain flares up.  Follow-up Follow-up plan discussed. - Schedule follow-up appointment in six months. - Contact office if symptoms change or concerns arise before next visit.

## 2024-03-18 ENCOUNTER — Ambulatory Visit: Payer: Self-pay | Admitting: Family Medicine

## 2024-03-27 DIAGNOSIS — E785 Hyperlipidemia, unspecified: Secondary | ICD-10-CM | POA: Diagnosis not present

## 2024-03-27 DIAGNOSIS — R809 Proteinuria, unspecified: Secondary | ICD-10-CM | POA: Diagnosis not present

## 2024-03-27 DIAGNOSIS — I10 Essential (primary) hypertension: Secondary | ICD-10-CM | POA: Diagnosis not present

## 2024-05-23 ENCOUNTER — Ambulatory Visit: Payer: Self-pay

## 2024-05-23 NOTE — Telephone Encounter (Signed)
 FYI

## 2024-05-23 NOTE — Telephone Encounter (Signed)
 FYI Only or Action Required?: Action required by provider: clinical question for provider.  Patient was last seen in primary care on 03/11/2024 by Glenard Mire, MD.  Called Nurse Triage reporting Covid Positive.  Symptoms began a week ago.  Interventions attempted: OTC medications: Mucinex and Rest, hydration, or home remedies.  Symptoms are: gradually improving.  Triage Disposition: Call PCP Within 24 Hours  Patient/caregiver understands and will follow disposition?: Yes  Copied from CRM [id number]. Topic: Clinical - Medical Advice >> May 23, 2024 11:43 AM Frank Hamilton wrote: Reason for CRM:  He tested positive for COVID yesterday. He wants a nurse to call him to discuss any medications that he needs to take. Please call him at [phone] Reason for Disposition  [1] HIGH RISK patient (e.g., weak immune system, age > 64 years, obesity with BMI 30 or higher, pregnant, chronic lung disease or other chronic medical condition) AND [2] COVID symptoms (e.g., cough, fever)  (Exceptions: Already seen by PCP and no new or worsening symptoms.)  Answer Assessment - Initial Assessment Questions 1. COVID-19 DIAGNOSIS: How do you know that you have COVID? (e.g., positive lab test or self-test, diagnosed by doctor or NP/PA, symptoms after exposure).     Home test positive on 05/22/24  2. ONSET: When did the COVID-19 symptoms start?      One week ago 05/16/24 3. WORST SYMPTOM: What is your worst symptom? (e.g., cough, fever, shortness of breath, muscle aches)     Cough 4. COUGH: Do you have a cough? If Yes, ask: How bad is the cough?       Yes, had been dry hacking, changed to productive,  but improved over the past few days 5. FEVER: Do you have a fever? If Yes, ask: What is your temperature, how was it measured, and when did it start?     denies 6. RESPIRATORY STATUS: Describe your breathing? (e.g., normal; shortness of breath, wheezing, unable to speak)      Runny nose. No breathing  difficulty 7. BETTER-SAME-WORSE: Are you getting better, staying the same or getting worse compared to yesterday?  If getting worse, ask, In what way?     better 8. OTHER SYMPTOMS: Do you have any other symptoms?  (e.g., chills, fatigue, headache, loss of smell or taste, muscle pain, sore throat)     Slight headache, fatigue, eye watering, cough causing rib pain, no pain when not coughing. Had a sore throat that is now mostly resolved.   Additional info:  He is using mucinex with effect. He is inquiring if he should take antiviral or other medicine since testing positive for Covid 19. Please advise.  Protocols used: Coronavirus (COVID-19) Diagnosed or Suspected-A-AH

## 2024-05-23 NOTE — Telephone Encounter (Signed)
 Pt informed and will continue to monitor. If worsen he will give us  a call back

## 2024-08-08 NOTE — Progress Notes (Signed)
 Frank Hamilton                                          MRN: [id number]   08/08/2024   The [facility] Quality Team Specialist reviewed this patient medical record for the purposes of chart review for care gap closure. The following were reviewed: chart review for care gap closure-kidney health evaluation for diabetes:eGFR  and uACR.    [facility] Quality Team

## 2024-09-10 ENCOUNTER — Ambulatory Visit: Admitting: Family Medicine

## 2024-09-17 ENCOUNTER — Encounter: Payer: Self-pay | Admitting: Family Medicine

## 2024-09-17 ENCOUNTER — Ambulatory Visit (INDEPENDENT_AMBULATORY_CARE_PROVIDER_SITE_OTHER): Admitting: Family Medicine

## 2024-09-17 VITALS — BP 122/72 | HR 48 | Resp 18 | Ht 71.0 in | Wt 196.1 lb

## 2024-09-17 DIAGNOSIS — E1159 Type 2 diabetes mellitus with other circulatory complications: Secondary | ICD-10-CM | POA: Diagnosis not present

## 2024-09-17 DIAGNOSIS — E785 Hyperlipidemia, unspecified: Secondary | ICD-10-CM

## 2024-09-17 DIAGNOSIS — R809 Proteinuria, unspecified: Secondary | ICD-10-CM

## 2024-09-17 DIAGNOSIS — M5416 Radiculopathy, lumbar region: Secondary | ICD-10-CM

## 2024-09-17 DIAGNOSIS — G8929 Other chronic pain: Secondary | ICD-10-CM

## 2024-09-17 DIAGNOSIS — Z23 Encounter for immunization: Secondary | ICD-10-CM | POA: Diagnosis not present

## 2024-09-17 DIAGNOSIS — R001 Bradycardia, unspecified: Secondary | ICD-10-CM

## 2024-09-17 DIAGNOSIS — I739 Peripheral vascular disease, unspecified: Secondary | ICD-10-CM

## 2024-09-17 DIAGNOSIS — E1169 Type 2 diabetes mellitus with other specified complication: Secondary | ICD-10-CM

## 2024-09-17 DIAGNOSIS — E538 Deficiency of other specified B group vitamins: Secondary | ICD-10-CM

## 2024-09-17 DIAGNOSIS — I152 Hypertension secondary to endocrine disorders: Secondary | ICD-10-CM | POA: Diagnosis not present

## 2024-09-17 DIAGNOSIS — I350 Nonrheumatic aortic (valve) stenosis: Secondary | ICD-10-CM

## 2024-09-17 DIAGNOSIS — M19041 Primary osteoarthritis, right hand: Secondary | ICD-10-CM

## 2024-09-17 LAB — POCT GLYCOSYLATED HEMOGLOBIN (HGB A1C): Hemoglobin A1C: 6.1 % — AB (ref 4.0–5.6)

## 2024-09-17 MED ORDER — ATORVASTATIN CALCIUM 40 MG PO TABS: 40.0000 mg | ORAL_TABLET | Freq: Every day | ORAL | 1 refills | Status: AC

## 2024-09-17 MED ORDER — EMPAGLIFLOZIN 25 MG PO TABS: 25.0000 mg | ORAL_TABLET | Freq: Every day | ORAL | 1 refills | Status: AC

## 2024-09-17 MED ORDER — AMLODIPINE BESYLATE-VALSARTAN 10-320 MG PO TABS: 1.0000 | ORAL_TABLET | Freq: Every day | ORAL | 1 refills | Status: AC

## 2024-09-17 MED ORDER — FENOFIBRATE 145 MG PO TABS: 145.0000 mg | ORAL_TABLET | Freq: Every day | ORAL | 1 refills | Status: AC

## 2024-09-17 MED ORDER — HYDROCHLOROTHIAZIDE 12.5 MG PO TABS: 12.5000 mg | ORAL_TABLET | Freq: Every day | ORAL | 1 refills | Status: AC

## 2024-09-17 NOTE — Progress Notes (Signed)
 Name: Frank Hamilton   MRN: [id number]    DOB: [birth year]   Date:09/17/2024       Progress Note  Subjective  Chief Complaint  Chief Complaint  Patient presents with   Medical Management of Chronic Issues   Discussed the use of AI scribe software for clinical note transcription with the patient, who gave verbal consent to proceed.  History of Present Illness Frank Hamilton is an 83 year old male with hypertension, diabetes, and chronic kidney disease who presents for a six-month follow-up visit.  About a month and a half ago, he experienced a sore throat and cough, milder than his previous COVID-19 infection, without fever. The episode lasted about a week, starting with dry sinuses that became runny after a couple of days. Two at-home COVID tests were negative. Currently, he has occasional coughing, which is not troublesome.  He has hypertension and is taking valsartan , HCTZ, and amlodipine . His blood pressure was elevated at a previous visit with his nephrologist , he has macroalbuminuria. He does not regularly check his blood pressure at home.  He was diagnosed with diabetes in July 2024 and is on Jardiance . He occasionally checks his blood sugar at home, with readings from 114 to 138 mg/dL. His A1c has improved from 6.4% to 6.1%. 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 He continues on valsartan  and Jardiance . He has not had a renal biopsy.  He takes atorvastatin  and fenofibrate  for hyperlipidemia associated with diabetes. He denies muscle aches from atorvastatin  and is compliant with his medications, missing doses no more than once every couple of weeks.  He has a history of vascular disease and reports improved achiness in his legs when walking, although he walks less frequently now, attributing the improvement to reduced activity.  He  has mild aortic stenosis and underwent an echocardiogram in 2023. No dizziness or syncope. He has not followed up with his cardiologist.  He experiences low back pain and occasionally takes pregabalin  for relief, particularly for sciatic pain radiating down his right leg. He also has arthritis in his hands, causing stiffness but not significant pain.    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/08/2022   Spinal stenosis, lumbar region, with neurogenic claudication 02/10/2021   Chronic radicular lumbar pain 02/10/2021   Lumbar spondylosis 02/10/2021   DDD (degenerative disc disease), lumbar 02/10/2021   Chronic pain syndrome 02/10/2021   Positive for macroalbuminuria 08/06/2018   PVD (peripheral vascular disease) with claudication 07/02/2018   Senile purpura 07/02/2018   Claudication 04/23/2018   B12 deficiency 05/02/2016   Vitamin D  deficiency 05/02/2016   Osteoarthritis of both hands 05/01/2016   Essential hypertension 08/03/2015   Dyslipidemia 08/03/2015   Kidney cysts 12/15/2013    Past Surgical History:  Procedure Laterality Date   bowel obstruction  2011   CATARACT EXTRACTION, BILATERAL  2017   HERNIA REPAIR  2009 and 2010   TONSILLECTOMY     age 70 or 27    Family History  Problem Relation Age of Onset   Heart disease Mother    Stroke Father    Heart disease Sister    Diabetes Sister    Diabetes Maternal Grandfather     Social  History   Tobacco Use   Smoking status: Former    Current packs/day: 0.00    Average packs/day: 1 pack/day for 20.0 years (20.0 ttl pk-yrs)    Types: Cigarettes    Start date: 06/08/1954    Quit date: 06/08/1974    Years since quitting: 50.3   Smokeless tobacco: Never  Substance Use Topics   Alcohol use: Yes    Alcohol/week: 1.0 standard drink of alcohol    Types: 1 Cans of beer per week     Comment: social     Current Outpatient Medications:    amLODipine -valsartan  (EXFORGE ) 10-320 MG tablet, Take 1 tablet by mouth daily. In place of amlodipine , valsartan  hydrochlorothiazide , Disp: 90 tablet, Rfl: 1   aspirin 81 MG chewable tablet, Chew 81 mg by mouth., Disp: , Rfl:    atorvastatin  (LIPITOR) 40 MG tablet, Take 1 tablet (40 mg total) by mouth daily., Disp: 90 tablet, Rfl: 1   Cholecalciferol (VITAMIN D ) 2000 units CAPS, Take 1 capsule (2,000 Units total) by mouth daily., Disp: 30 capsule, Rfl:    empagliflozin  (JARDIANCE ) 25 MG TABS tablet, Take 1 tablet (25 mg total) by mouth daily., Disp: 90 tablet, Rfl: 1   fenofibrate  (TRICOR ) 145 MG tablet, Take 1 tablet (145 mg total) by mouth daily. In place of lovaza , Disp: 90 tablet, Rfl: 1   glucosamine-chondroitin 500-400 MG tablet, Take 1 tablet by mouth in the morning and at bedtime. , Disp: , Rfl:    hydrochlorothiazide  (HYDRODIURIL ) 12.5 MG tablet, Take 1 tablet (12.5 mg total) by mouth daily., Disp: 90 tablet, Rfl: 1   latanoprost (XALATAN) 0.005 % ophthalmic solution, SMARTSIG:1 Drop(s) In Eye(s) Every Evening, Disp: , Rfl:    pregabalin  (LYRICA ) 25 MG capsule, Take 1 capsule (25 mg total) by mouth at bedtime as needed., Disp: 90 capsule, Rfl: 0  Allergies  Allergen Reactions   Gabapentin  Rash    Headache also reported   Sulfa Antibiotics Itching and Rash    I personally reviewed active problem list, medication list, allergies, family history with the patient/caregiver today.   ROS  Ten systems reviewed and is negative except as mentioned in HPI    Objective Physical Exam VITALS: P- 48, BP- 122/72 MEASUREMENTS: Weight- 196, BMI- 27.35. CONSTITUTIONAL: Patient appears well-developed and well-nourished. No distress. HEENT: Head atraumatic, normocephalic, neck supple. CARDIOVASCULAR: Bradycardia with heart rate at 48 bpm, asymptomatic. Regular rhythm and normal heart sounds. No murmur heard. No BLE edema. PULMONARY:  Effort normal and breath sounds normal. No respiratory distress. ABDOMINAL: There is no tenderness or distention. MUSCULOSKELETAL: Normal gait. Without gross motor or sensory deficit. PSYCHIATRIC: Patient has a normal mood and affect. Behavior is normal. Judgment and thought content normal.  Vitals:   09/17/24 1004  BP: 122/72  Pulse: (!) 48  Resp: 18  SpO2: 97%  Weight: 196 lb 1.6 oz (89 kg)  Height: 5' 11 (1.803 m)    Body mass index is 27.35 kg/m.  Recent Results (from the past 2160 hours)  POCT glycosylated hemoglobin (Hb A1C)     Status: Abnormal   Collection Time: 09/17/24 10:11 AM  Result Value Ref Range   Hemoglobin A1C 6.1 (A) 4.0 - 5.6 %   HbA1c POC (<> result, manual entry)     HbA1c, POC (prediabetic range)     HbA1c, POC (controlled diabetic range)      Diabetic Foot Exam:     PHQ2/9:    09/17/2024    9:56 AM 03/11/2024   11:04 AM  01/24/2024   10:52 AM 11/22/2023   12:15 PM 09/28/2023    1:58 PM  Depression screen PHQ 2/9  Decreased Interest 0 0 0 0 0  Down, Depressed, Hopeless 0 0 0 0 0  PHQ - 2 Score 0 0 0 0 0  Altered sleeping  0 2  0  Tired, decreased energy  0 3  0  Change in appetite  0 0  0  Feeling bad or failure about yourself   0 0  0  Trouble concentrating  0 0  0  Moving slowly or fidgety/restless  0 0  0  Suicidal thoughts  0 0  0  PHQ-9 Score  0 5  0  Difficult doing work/chores  Not difficult at all Not difficult at all      phq 9 is negative  Fall Risk:    09/17/2024    9:56 AM 01/24/2024   10:47 AM 11/22/2023   12:15 PM 09/28/2023    1:58 PM 06/13/2023   11:00 AM  Fall Risk   Falls in the past year? 1 0 0 0 0  Number falls in past yr: 0 0     Injury with Fall? 0 0     Risk for fall due to : Impaired balance/gait No Fall Risks  No Fall Risks No Fall Risks  Follow up Falls evaluation completed Falls prevention discussed;Falls evaluation completed  Falls prevention discussed Falls prevention discussed      Assessment &  Plan Type 2 diabetes mellitus with proteinuria Blood glucose levels stable. Hemoglobin A1c improved. Proteinuria managed with current medications. - Continue valsartan  and Jardiance . - Ordered blood work: protein in urine, blood sugar, kidney and liver function tests. - Monitor blood glucose levels as needed.  Peripheral vascular disease with intermittent claudication Intermittent claudication improved with activity and medication. - Encouraged increased walking. - continue statin therapy and aspirin  Hyperlipidemia associated with diabetes Managed with atorvastatin  and fenofibrate . Lipid panel pending. - Continue atorvastatin  and fenofibrate . - Ordered lipid panel.  Hypertension Well-controlled with current medications. - Continue valsartan , HCTZ, and amlodipine . - Encouraged home blood pressure monitoring.  Nonrheumatic aortic valve stenosis, mild Mild stenosis. Follow-up with cardiologist overdue. - Referred to cardiologist for echocardiogram and follow-up.  Bradycardia, asymptomatic Asymptomatic with heart rate of 48 bpm. - Referred to cardiologist for EKG and follow up of aortic stenosis.  Primary osteoarthritis, right hand Stiffness present, managed with pregabalin . - Continue pregabalin  as needed.  Lumbar radiculopathy with intermittent sciatica Symptoms managed with pregabalin . - Continue pregabalin  as needed.  Deficiency of B group vitamins, treated B12 levels managed with supplementation. - Ordered B12 level. - Continue B12 supplementation.  Encounter for immunization (influenza) Influenza vaccination administered. - Documented influenza vaccination.

## 2024-09-18 ENCOUNTER — Ambulatory Visit: Payer: Self-pay | Admitting: Family Medicine

## 2024-09-20 LAB — COMPREHENSIVE METABOLIC PANEL WITH GFR
AG Ratio: 2.2 (calc) (ref 1.0–2.5)
ALT: 22 U/L (ref 9–46)
AST: 22 U/L (ref 10–35)
Albumin: 4.7 g/dL (ref 3.6–5.1)
Alkaline phosphatase (APISO): 50 U/L (ref 35–144)
BUN: 20 mg/dL (ref 7–25)
CO2: 29 mmol/L (ref 20–32)
Calcium: 9.9 mg/dL (ref 8.6–10.3)
Chloride: 103 mmol/L (ref 98–110)
Creat: 0.72 mg/dL (ref 0.70–1.22)
Globulin: 2.1 g/dL (ref 1.9–3.7)
Glucose, Bld: 96 mg/dL (ref 65–139)
Potassium: 5 mmol/L (ref 3.5–5.3)
Sodium: 140 mmol/L (ref 135–146)
Total Bilirubin: 0.7 mg/dL (ref 0.2–1.2)
Total Protein: 6.8 g/dL (ref 6.1–8.1)
eGFR: 92 mL/min/1.73m2 (ref >=60)

## 2024-09-20 LAB — CBC WITH DIFFERENTIAL/PLATELET
Absolute Lymphocytes: 2295 {cells}/uL (ref 850–3900)
Absolute Monocytes: 707 {cells}/uL (ref 200–950)
Basophils Absolute: 30 {cells}/uL (ref 0–200)
Basophils Relative: 0.4 %
Eosinophils Absolute: 289 {cells}/uL (ref 15–500)
Eosinophils Relative: 3.8 %
HCT: 46.7 % (ref 38.5–50.0)
Hemoglobin: 15.6 g/dL (ref 13.2–17.1)
MCH: 30.1 pg (ref 27.0–33.0)
MCHC: 33.4 g/dL (ref 32.0–36.0)
MCV: 90.2 fL (ref 80.0–100.0)
MPV: 10.1 fL (ref 7.5–12.5)
Monocytes Relative: 9.3 %
Neutro Abs: 4279 {cells}/uL (ref 1500–7800)
Neutrophils Relative %: 56.3 %
Platelets: 221 Thousand/uL (ref 140–400)
RBC: 5.18 Million/uL (ref 4.20–5.80)
RDW: 13.6 % (ref 11.0–15.0)
Total Lymphocyte: 30.2 %
WBC: 7.6 Thousand/uL (ref 3.8–10.8)

## 2024-09-20 LAB — LIPID PANEL
Cholesterol: 123 mg/dL (ref <200)
HDL: 36 mg/dL — ABNORMAL LOW (ref >=40)
LDL Cholesterol (Calc): 65 mg/dL
Non-HDL Cholesterol (Calc): 87 mg/dL (ref <130)
Total CHOL/HDL Ratio: 3.4 (calc) (ref <5.0)
Triglycerides: 137 mg/dL (ref <150)

## 2024-09-20 LAB — MICROALBUMIN / CREATININE URINE RATIO
Creatinine, Urine: 39 mg/dL (ref 20–320)
Microalb Creat Ratio: 1587 mg/g{creat} — ABNORMAL HIGH (ref <30)
Microalb, Ur: 61.9 mg/dL

## 2024-09-20 LAB — VITAMIN B12: Vitamin B-12: 1753 pg/mL — ABNORMAL HIGH (ref 200–1100)

## 2024-10-29 ENCOUNTER — Ambulatory Visit: Payer: Medicare Other | Admitting: Dermatology

## 2024-10-29 ENCOUNTER — Encounter: Payer: Self-pay | Admitting: Dermatology

## 2024-10-29 DIAGNOSIS — L578 Other skin changes due to chronic exposure to nonionizing radiation: Secondary | ICD-10-CM | POA: Diagnosis not present

## 2024-10-29 DIAGNOSIS — Z1283 Encounter for screening for malignant neoplasm of skin: Secondary | ICD-10-CM

## 2024-10-29 DIAGNOSIS — L570 Actinic keratosis: Secondary | ICD-10-CM

## 2024-10-29 DIAGNOSIS — L814 Other melanin hyperpigmentation: Secondary | ICD-10-CM | POA: Diagnosis not present

## 2024-10-29 DIAGNOSIS — D1801 Hemangioma of skin and subcutaneous tissue: Secondary | ICD-10-CM

## 2024-10-29 DIAGNOSIS — L821 Other seborrheic keratosis: Secondary | ICD-10-CM | POA: Diagnosis not present

## 2024-10-29 DIAGNOSIS — W908XXA Exposure to other nonionizing radiation, initial encounter: Secondary | ICD-10-CM | POA: Diagnosis not present

## 2024-10-29 DIAGNOSIS — D229 Melanocytic nevi, unspecified: Secondary | ICD-10-CM

## 2024-10-29 NOTE — Progress Notes (Signed)
 Follow-Up Visit   Subjective  Frank Hamilton is a 83 y.o. male who presents for the following: Skin Cancer Screening and Full Body Skin Exam. Hx of AKs. No Hx of skin cancer.  Patient reports spots on his chest and back.  The patient presents for Total-Body Skin Exam (TBSE) for skin cancer screening and mole check. The patient has spots, moles and lesions to be evaluated, some may be new or changing and the patient may have concern these could be cancer.  The following portions of the chart were reviewed this encounter and updated as appropriate: medications, allergies, medical history  Review of Systems:  No other skin or systemic complaints except as noted in HPI or Assessment and Plan.  Objective  Well appearing patient in no apparent distress; mood and affect are within normal limits.  A full examination was performed including scalp, head, eyes, ears, nose, lips, neck, chest, axillae, abdomen, back, buttocks, bilateral upper extremities, bilateral lower extremities, hands, feet, fingers, toes, fingernails, and toenails. All findings within normal limits unless otherwise noted below.   Relevant physical exam findings are noted in the Assessment and Plan.  Left Ear rt temple x5 (5) Erythematous thin papules/macules with gritty scale.   Assessment & Plan   SKIN CANCER SCREENING PERFORMED TODAY.  ACTINIC DAMAGE - Chronic condition, secondary to cumulative UV/sun exposure - diffuse scaly erythematous macules with underlying dyspigmentation - Recommend daily broad spectrum sunscreen SPF 30+ to sun-exposed areas, reapply every 2 hours as needed.  - Staying in the shade or wearing long sleeves, sun glasses (UVA+UVB protection) and wide brim hats (4-inch brim around the entire circumference of the hat) are also recommended for sun protection.  - Call for new or changing lesions.  LENTIGINES, SEBORRHEIC KERATOSES, HEMANGIOMAS - Benign normal skin lesions - Benign-appearing - Call  for any changes  MELANOCYTIC NEVI - Tan-brown and/or pink-flesh-colored symmetric macules and papules - Benign appearing on exam today - Observation - Call clinic for new or changing moles - Recommend daily use of broad spectrum spf 30+ sunscreen to sun-exposed areas.  AK (ACTINIC KERATOSIS) (5) Left Ear rt temple x5 (5) Actinic keratoses are precancerous spots that appear secondary to cumulative UV radiation exposure/sun exposure over time. They are chronic with expected duration over 1 year. A portion of actinic keratoses will progress to squamous cell carcinoma of the skin. It is not possible to reliably predict which spots will progress to skin cancer and so treatment is recommended to prevent development of skin cancer.  Recommend daily broad spectrum sunscreen SPF 30+ to sun-exposed areas, reapply every 2 hours as needed.  Recommend staying in the shade or wearing long sleeves, sun glasses (UVA+UVB protection) and wide brim hats (4-inch brim around the entire circumference of the hat). Call for new or changing lesions. - Destruction of lesion - Left Ear rt temple x5 (5) Complexity: simple   Destruction method: cryotherapy   Informed consent: discussed and consent obtained   Timeout:  patient name, date of birth, surgical site, and procedure verified Lesion destroyed using liquid nitrogen: Yes   Region frozen until ice ball extended beyond lesion: Yes   Outcome: patient tolerated procedure well with no complications   Post-procedure details: wound care instructions given   Additional details:  Prior to procedure, discussed risks of blister formation, small wound, skin dyspigmentation, or rare scar following cryotherapy. Recommend Vaseline ointment to treated areas while healing.   Return in about 1 year (around 10/29/2025) for TBSE.  I,  Jill Parcell, CMA, am acting as scribe for Alm Rhyme, MD.   Documentation: I have reviewed the above documentation for accuracy and  completeness, and I agree with the above.  Alm Rhyme, MD

## 2024-10-29 NOTE — Patient Instructions (Signed)

## 2024-11-26 ENCOUNTER — Encounter: Payer: Self-pay | Admitting: Cardiology

## 2024-11-26 ENCOUNTER — Ambulatory Visit: Attending: Cardiology | Admitting: Cardiology

## 2024-11-26 VITALS — BP 158/60 | HR 54 | Ht 71.0 in | Wt 199.4 lb

## 2024-11-26 DIAGNOSIS — I350 Nonrheumatic aortic (valve) stenosis: Secondary | ICD-10-CM | POA: Diagnosis not present

## 2024-11-26 DIAGNOSIS — I10 Essential (primary) hypertension: Secondary | ICD-10-CM

## 2024-11-26 DIAGNOSIS — E7800 Pure hypercholesterolemia, unspecified: Secondary | ICD-10-CM | POA: Diagnosis not present

## 2024-11-26 NOTE — Patient Instructions (Signed)
 Medication Instructions:  Your physician recommends that you continue on your current medications as directed. Please refer to the Current Medication list given to you today.   *If you need a refill on your cardiac medications before your next appointment, please call your pharmacy*  Lab Work: No labs ordered today  If you have labs (blood work) drawn today and your tests are completely normal, you will receive your results only by: MyChart Message (if you have MyChart) OR A paper copy in the mail If you have any lab test that is abnormal or we need to change your treatment, we will call you to review the results.  Testing/Procedures: Your physician has requested that you have an echocardiogram. Echocardiography is a painless test that uses sound waves to create images of your heart. It provides your doctor with information about the size and shape of your heart and how well your heart's chambers and valves are working.   You may receive an ultrasound enhancing agent through an IV if needed to better visualize your heart during the echo. This procedure takes approximately one hour.  There are no restrictions for this procedure.  This will take place at [address] [facility] [address], [place] [address]  Please note: We ask at that you not bring children with you during ultrasound (echo/ vascular) testing. Due to room size and safety concerns, children are not allowed in the ultrasound rooms during exams. Our front office staff cannot provide observation of children in our lobby area while testing is being conducted. An adult accompanying a patient to their appointment will only be allowed in the ultrasound room at the discretion of the ultrasound technician under special circumstances. We apologize for any inconvenience.   Follow-Up: At [facility], you and your health needs are our priority.  As part of our continuing mission to provide you with exceptional heart  care, our providers are all part of one team.  This team includes your primary Cardiologist (physician) and Advanced Practice Providers or APPs (Physician Assistants and Nurse Practitioners) who all work together to provide you with the care you need, when you need it.  Your next appointment:   3 month(s)  Provider:   You may see Dr. Darliss or one of the following Advanced Practice Providers on your designated Care Team:   Lonni Meager, NP Lesley Maffucci, PA-C Bernardino Bring, PA-C Cadence [place], PA-C Tylene Lunch, NP Barnie Hila, NP    We recommend signing up for the patient portal called MyChart.  Sign up information is provided on this After Visit Summary.  MyChart is used to connect with patients for Virtual Visits (Telemedicine).  Patients are able to view lab/test results, encounter notes, upcoming appointments, etc.  Non-urgent messages can be sent to your provider as well.   To learn more about what you can do with MyChart, go to [email or web address].

## 2024-11-26 NOTE — Progress Notes (Signed)
 " Cardiology Office Note:    Date:  11/26/2024   ID:  Frank Hamilton, DOB 1941-06-09, MRN [id number]  PCP:  Sowles, Krichna, MD   [facility] Providers Cardiologist:  None     Referring MD: Glenard Mire, MD   Chief Complaint  Patient presents with   Aortic Stenosis    Patient is doing well on today. Meds reviewed.     History of Present Illness:    Frank Hamilton is a 84 y.o. male with a hx of hypertension, hyperlipidemia, mild aortic stenosis who presents for follow-up.    Doing okay, denies chest pain or shortness of breath.  Blood pressures at home averages 140s systolic.  Denies dizziness, presyncope or syncope.  Compliant with medications as prescribed.   Prior notes Echo 10/23 EF 60 to 65%, mild aortic valve stenosis, mean gradient 19 mmHg. Echo Dec 20, 2016 EF 65 to 70%, mild aortic valve stenosis  Past Medical History:  Diagnosis Date   Hyperlipidemia    Hypertension    Kidney cysts    Osteoarthritis    PVD (peripheral vascular disease)    Vitamin D  deficiency     Past Surgical History:  Procedure Laterality Date   bowel obstruction  20-Dec-2009   CATARACT EXTRACTION, BILATERAL  2015/12/21   HERNIA REPAIR  2007-12-21 and Dec 20, 2008   TONSILLECTOMY     age 64 or 40    Current Medications: Current Meds  Medication Sig   amLODipine -valsartan  (EXFORGE ) 10-320 MG tablet Take 1 tablet by mouth daily. In place of amlodipine , valsartan  hydrochlorothiazide    aspirin 81 MG chewable tablet Chew 81 mg by mouth.   atorvastatin  (LIPITOR) 40 MG tablet Take 1 tablet (40 mg total) by mouth daily.   Cholecalciferol (VITAMIN D ) 2000 units CAPS Take 1 capsule (2,000 Units total) by mouth daily.   empagliflozin  (JARDIANCE ) 25 MG TABS tablet Take 1 tablet (25 mg total) by mouth daily.   fenofibrate  (TRICOR ) 145 MG tablet Take 1 tablet (145 mg total) by mouth daily. In place of lovaza    glucosamine-chondroitin 500-400 MG tablet Take 1 tablet by mouth in the morning and at bedtime.     latanoprost (XALATAN) 0.005 % ophthalmic solution SMARTSIG:1 Drop(s) In Eye(s) Every Evening   pregabalin  (LYRICA ) 25 MG capsule Take 1 capsule (25 mg total) by mouth at bedtime as needed.     Allergies:   Gabapentin  and Sulfa antibiotics   Social History   Socioeconomic History   Marital status: Widowed    Spouse name: Not on file   Number of children: 3   Years of education: Not on file   Highest education level: High school graduate  Occupational History   Occupation: retired  Tobacco Use   Smoking status: Former    Current packs/day: 0.00    Average packs/day: 1 pack/day for 20.0 years (20.0 ttl pk-yrs)    Types: Cigarettes    Start date: 06/08/1954    Quit date: 06/08/1974    Years since quitting: 50.5   Smokeless tobacco: Never  Vaping Use   Vaping status: Never Used  Substance and Sexual Activity   Alcohol use: Yes    Alcohol/week: 1.0 standard drink of alcohol    Types: 1 Cans of beer per week    Comment: social   Drug use: No   Sexual activity: Not Currently  Other Topics Concern   Not on file  Social History Narrative   He was married for 48 years , wife died in 12/21/2015. Lives  with his dog   Social Drivers of Health   Tobacco Use: Medium Risk (11/26/2024)   Patient History    Smoking Tobacco Use: Former    Smokeless Tobacco Use: Never    Passive Exposure: Not on file  Financial Resource Strain: Low Risk (01/24/2024)   Overall Financial Resource Strain (CARDIA)    Difficulty of Paying Living Expenses: Not hard at all  Food Insecurity: No Food Insecurity (01/24/2024)   Hunger Vital Sign    Worried About Running Out of Food in the Last Year: Never true    Ran Out of Food in the Last Year: Never true  Transportation Needs: No Transportation Needs (01/24/2024)   PRAPARE - Administrator, Civil Service (Medical): No    Lack of Transportation (Non-Medical): No  Physical Activity: Sufficiently Active (01/24/2024)   Exercise Vital Sign    Days of Exercise  per Week: 7 days    Minutes of Exercise per Session: 30 min  Stress: No Stress Concern Present (01/24/2024)   [employer] of Occupational Health - Occupational Stress Questionnaire    Feeling of Stress : Not at all  Social Connections: Moderately Isolated (01/24/2024)   Social Connection and Isolation Panel    Frequency of Communication with Friends and Family: More than three times a week    Frequency of Social Gatherings with Friends and Family: Twice a week    Attends Religious Services: More than 4 times per year    Active Member of [employer] or Organizations: No    Attends Banker Meetings: Never    Marital Status: Widowed  Depression (PHQ2-9): Low Risk (09/17/2024)   Depression (PHQ2-9)    PHQ-2 Score: 0  Alcohol Screen: Low Risk (01/24/2024)   Alcohol Screen    Last Alcohol Screening Score (AUDIT): 0  Housing: Low Risk (01/24/2024)   Housing Stability Vital Sign    Unable to Pay for Housing in the Last Year: No    Number of Times Moved in the Last Year: 0    Homeless in the Last Year: No  Utilities: Not At Risk (01/24/2024)   AHC Utilities    Threatened with loss of utilities: No  Health Literacy: Adequate Health Literacy (01/24/2024)   B1300 Health Literacy    Frequency of need for help with medical instructions: Never     Family History: The patient's family history includes Diabetes in his maternal grandfather and sister; Heart disease in his mother and sister; Stroke in his father.  ROS:   Please see the history of present illness.     All other systems reviewed and are negative.  EKGs/Labs/Other Studies Reviewed:    The following studies were reviewed today:   EKG:  EKG is  ordered today.  The ekg ordered today demonstrates sinus bradycardia, right bundle branch block  Recent Labs: 09/17/2024: ALT 22; BUN 20; Creat 0.72; Hemoglobin 15.6; Platelets 221; Potassium 5.0; Sodium 140  Recent Lipid Panel    Component Value Date/Time   CHOL 123 09/17/2024  1102   CHOL 197 08/04/2015 0935   TRIG 137 09/17/2024 1102   HDL 36 (L) 09/17/2024 1102   HDL 29 (L) 08/04/2015 0935   CHOLHDL 3.4 09/17/2024 1102   VLDL NOT CALC 06/27/2017 1044   LDLCALC 65 09/17/2024 1102     Risk Assessment/Calculations:          Physical Exam:    VS:  BP (!) 158/60 (BP Location: Left Arm, Patient Position: Sitting, Cuff Size: Large)  Pulse (!) 54   Ht 5' 11 (1.803 m)   Wt 199 lb 6.4 oz (90.4 kg)   SpO2 97%   BMI 27.81 kg/m     Wt Readings from Last 3 Encounters:  11/26/24 199 lb 6.4 oz (90.4 kg)  09/17/24 196 lb 1.6 oz (89 kg)  03/11/24 203 lb 4.8 oz (92.2 kg)     GEN:  Well nourished, well developed in no acute distress HEENT: Normal NECK: No JVD; No carotid bruits CARDIAC: RRR, 2/6 systolic murmur RESPIRATORY:  Clear to auscultation without rales, wheezing or rhonchi  ABDOMEN: Soft, non-tender, non-distended MUSCULOSKELETAL:  No edema; No deformity  SKIN: Warm and dry NEUROLOGIC:  Alert and oriented x 3 PSYCHIATRIC:  Normal affect   ASSESSMENT:    1. Aortic stenosis, mild   2. Primary hypertension   3. Pure hypercholesterolemia    PLAN:    In order of problems listed above:  History of mild aortic valve stenosis.  echo 08/2022 EF 60 to 65%, mild aortic valve stenosis, mean gradient 19 mmHg. repeat echocardiogram. Hypertension, slightly elevated, averaging 130s at home.  Continue amlodipine -valsartan  10-320 mg daily,  HCTZ 12.5 mg daily. Hyperlipidemia,-well-controlled.  Continue Lipitor 40 mg daily.  Follow-up in 3 months.      Medication Adjustments/Labs and Tests Ordered: Current medicines are reviewed at length with the patient today.  Concerns regarding medicines are outlined above.  Orders Placed This Encounter  Procedures   EKG 12-Lead   ECHOCARDIOGRAM COMPLETE   No orders of the defined types were placed in this encounter.   Patient Instructions  Medication Instructions:  Your physician recommends that you  continue on your current medications as directed. Please refer to the Current Medication list given to you today.   *If you need a refill on your cardiac medications before your next appointment, please call your pharmacy*  Lab Work: No labs ordered today  If you have labs (blood work) drawn today and your tests are completely normal, you will receive your results only by: MyChart Message (if you have MyChart) OR A paper copy in the mail If you have any lab test that is abnormal or we need to change your treatment, we will call you to review the results.  Testing/Procedures: Your physician has requested that you have an echocardiogram. Echocardiography is a painless test that uses sound waves to create images of your heart. It provides your doctor with information about the size and shape of your heart and how well your hearts chambers and valves are working.   You may receive an ultrasound enhancing agent through an IV if needed to better visualize your heart during the echo. This procedure takes approximately one hour.  There are no restrictions for this procedure.  This will take place at [address] [facility] [address] [facility], [place] [address]  Please note: We ask at that you not bring children with you during ultrasound (echo/ vascular) testing. Due to room size and safety concerns, children are not allowed in the ultrasound rooms during exams. Our front office staff cannot provide observation of children in our lobby area while testing is being conducted. An adult accompanying a patient to their appointment will only be allowed in the ultrasound room at the discretion of the ultrasound technician under special circumstances. We apologize for any inconvenience.   Follow-Up: At [facility], you and your health needs are our priority.  As part of our continuing mission to provide you with exceptional heart care,  our providers are all part of one team.  This team includes  your primary Cardiologist (physician) and Advanced Practice Providers or APPs (Physician Assistants and Nurse Practitioners) who all work together to provide you with the care you need, when you need it.  Your next appointment:   3 month(s)  Provider:   You may see Dr Darliss or one of the following Advanced Practice Providers on your designated Care Team:   Lonni Meager, NP Lesley Maffucci, PA-C Bernardino Bring, PA-C Cadence Glen Raven, PA-C Tylene Lunch, NP Barnie Hila, NP    We recommend signing up for the patient portal called MyChart.  Sign up information is provided on this After Visit Summary.  MyChart is used to connect with patients for Virtual Visits (Telemedicine).  Patients are able to view lab/test results, encounter notes, upcoming appointments, etc.  Non-urgent messages can be sent to your provider as well.   To learn more about what you can do with MyChart, go to [email or web address].              Signed, Redell Darliss, MD  11/26/2024 1:31 PM    [facility] "

## 2024-12-11 ENCOUNTER — Ambulatory Visit

## 2024-12-19 ENCOUNTER — Ambulatory Visit: Payer: Self-pay | Admitting: Cardiology

## 2024-12-19 ENCOUNTER — Ambulatory Visit

## 2024-12-19 DIAGNOSIS — I350 Nonrheumatic aortic (valve) stenosis: Secondary | ICD-10-CM

## 2024-12-19 LAB — ECHOCARDIOGRAM COMPLETE
AR max vel: 1.09 cm2
AV Area VTI: 1.1 cm2
AV Area mean vel: 1.01 cm2
AV Mean grad: 27 mmHg
AV Peak grad: 46.5 mmHg
Ao pk vel: 3.41 m/s
Area-P 1/2: 1.98 cm2
MV VTI: 1.69 cm2
S' Lateral: 2.2 cm

## 2025-01-26 ENCOUNTER — Ambulatory Visit

## 2025-01-30 ENCOUNTER — Ambulatory Visit

## 2025-02-25 ENCOUNTER — Ambulatory Visit: Admitting: Cardiology

## 2025-03-17 ENCOUNTER — Ambulatory Visit: Admitting: Family Medicine

## 2025-10-29 ENCOUNTER — Ambulatory Visit: Admitting: Dermatology
